# Patient Record
Sex: Male | Born: 1965
Health system: Southern US, Community
[De-identification: ages and names within clinical notes are randomized; demographics above are authoritative.]

## PROBLEM LIST (undated history)

## (undated) DIAGNOSIS — K219 Gastro-esophageal reflux disease without esophagitis: Secondary | ICD-10-CM

## (undated) DIAGNOSIS — N419 Inflammatory disease of prostate, unspecified: Secondary | ICD-10-CM

## (undated) DIAGNOSIS — R300 Dysuria: Secondary | ICD-10-CM

## (undated) DIAGNOSIS — M1990 Unspecified osteoarthritis, unspecified site: Secondary | ICD-10-CM

## (undated) DIAGNOSIS — IMO0001 Reserved for inherently not codable concepts without codable children: Secondary | ICD-10-CM

## (undated) DIAGNOSIS — U071 COVID-19: Secondary | ICD-10-CM

## (undated) DIAGNOSIS — K648 Other hemorrhoids: Secondary | ICD-10-CM

## (undated) DIAGNOSIS — I700 Atherosclerosis of aorta: Secondary | ICD-10-CM

## (undated) DIAGNOSIS — K5780 Diverticulitis of intestine, part unspecified, with perforation and abscess without bleeding: Secondary | ICD-10-CM

## (undated) DIAGNOSIS — I10 Essential (primary) hypertension: Secondary | ICD-10-CM

## (undated) DIAGNOSIS — R112 Nausea with vomiting, unspecified: Secondary | ICD-10-CM

## (undated) DIAGNOSIS — Z9889 Other specified postprocedural states: Secondary | ICD-10-CM

## (undated) DIAGNOSIS — G479 Sleep disorder, unspecified: Secondary | ICD-10-CM

## (undated) DIAGNOSIS — D126 Benign neoplasm of colon, unspecified: Secondary | ICD-10-CM

## (undated) DIAGNOSIS — R519 Headache, unspecified: Secondary | ICD-10-CM

## (undated) DIAGNOSIS — K859 Acute pancreatitis without necrosis or infection, unspecified: Secondary | ICD-10-CM

## (undated) DIAGNOSIS — Z8489 Family history of other specified conditions: Secondary | ICD-10-CM

## (undated) DIAGNOSIS — K5790 Diverticulosis of intestine, part unspecified, without perforation or abscess without bleeding: Secondary | ICD-10-CM

## (undated) DIAGNOSIS — N529 Male erectile dysfunction, unspecified: Secondary | ICD-10-CM

## (undated) DIAGNOSIS — E785 Hyperlipidemia, unspecified: Secondary | ICD-10-CM

## (undated) HISTORY — DX: Unspecified osteoarthritis, unspecified site: M19.90

## (undated) HISTORY — DX: Benign neoplasm of colon, unspecified: D12.6

## (undated) HISTORY — PX: KNEE SURGERY: SHX244

## (undated) HISTORY — PX: ABCESS DRAINAGE: SHX399

## (undated) HISTORY — PX: SHOULDER ARTHROSCOPY: SHX128

## (undated) HISTORY — DX: Essential (primary) hypertension: I10

## (undated) HISTORY — PX: HERNIA REPAIR: SHX51

## (undated) HISTORY — PX: SPHINCTEROTOMY: SHX5279

## (undated) HISTORY — DX: Reserved for inherently not codable concepts without codable children: IMO0001

## (undated) HISTORY — DX: Atherosclerosis of aorta: I70.0

## (undated) HISTORY — DX: Male erectile dysfunction, unspecified: N52.9

## (undated) HISTORY — PX: APPENDECTOMY: SHX54

## (undated) HISTORY — DX: Hyperlipidemia, unspecified: E78.5

## (undated) HISTORY — DX: Gastro-esophageal reflux disease without esophagitis: K21.9

## (undated) HISTORY — DX: Other hemorrhoids: K64.8

## (undated) HISTORY — PX: CHOLECYSTECTOMY: SHX55

## (undated) HISTORY — PX: TONSILLECTOMY: SUR1361

---

## 2001-01-28 ENCOUNTER — Emergency Department (HOSPITAL_COMMUNITY): Admission: EM | Admit: 2001-01-28 | Discharge: 2001-01-28 | Payer: Self-pay | Admitting: *Deleted

## 2001-02-15 ENCOUNTER — Other Ambulatory Visit: Admission: RE | Admit: 2001-02-15 | Discharge: 2001-02-15 | Payer: Self-pay | Admitting: Urology

## 2001-11-05 ENCOUNTER — Encounter: Payer: Self-pay | Admitting: Orthopaedic Surgery

## 2001-11-05 ENCOUNTER — Ambulatory Visit (HOSPITAL_COMMUNITY): Admission: RE | Admit: 2001-11-05 | Discharge: 2001-11-05 | Payer: Self-pay | Admitting: Orthopaedic Surgery

## 2003-06-26 ENCOUNTER — Encounter: Payer: Self-pay | Admitting: Family Medicine

## 2003-06-26 ENCOUNTER — Ambulatory Visit (HOSPITAL_COMMUNITY): Admission: RE | Admit: 2003-06-26 | Discharge: 2003-06-26 | Payer: Self-pay | Admitting: Family Medicine

## 2003-07-22 ENCOUNTER — Ambulatory Visit (HOSPITAL_COMMUNITY): Admission: RE | Admit: 2003-07-22 | Discharge: 2003-07-22 | Payer: Self-pay | Admitting: Internal Medicine

## 2003-07-28 ENCOUNTER — Ambulatory Visit (HOSPITAL_COMMUNITY): Admission: RE | Admit: 2003-07-28 | Discharge: 2003-07-28 | Payer: Self-pay | Admitting: Internal Medicine

## 2003-07-28 ENCOUNTER — Encounter: Payer: Self-pay | Admitting: Internal Medicine

## 2003-08-10 ENCOUNTER — Encounter: Payer: Self-pay | Admitting: Internal Medicine

## 2003-08-10 ENCOUNTER — Ambulatory Visit (HOSPITAL_COMMUNITY): Admission: RE | Admit: 2003-08-10 | Discharge: 2003-08-10 | Payer: Self-pay | Admitting: Internal Medicine

## 2004-06-27 ENCOUNTER — Ambulatory Visit (HOSPITAL_COMMUNITY): Admission: RE | Admit: 2004-06-27 | Discharge: 2004-06-27 | Payer: Self-pay | Admitting: Pediatrics

## 2004-07-12 ENCOUNTER — Ambulatory Visit (HOSPITAL_COMMUNITY): Admission: RE | Admit: 2004-07-12 | Discharge: 2004-07-12 | Payer: Self-pay | Admitting: Pediatrics

## 2004-08-01 ENCOUNTER — Ambulatory Visit (HOSPITAL_COMMUNITY): Admission: RE | Admit: 2004-08-01 | Discharge: 2004-08-01 | Payer: Self-pay | Admitting: Family Medicine

## 2004-08-04 ENCOUNTER — Ambulatory Visit (HOSPITAL_COMMUNITY): Admission: RE | Admit: 2004-08-04 | Discharge: 2004-08-04 | Payer: Self-pay | Admitting: Family Medicine

## 2004-10-11 ENCOUNTER — Ambulatory Visit: Payer: Self-pay | Admitting: Internal Medicine

## 2004-10-11 ENCOUNTER — Ambulatory Visit (HOSPITAL_BASED_OUTPATIENT_CLINIC_OR_DEPARTMENT_OTHER): Admission: RE | Admit: 2004-10-11 | Discharge: 2004-10-11 | Payer: Self-pay | Admitting: Rheumatology

## 2004-12-27 ENCOUNTER — Ambulatory Visit (HOSPITAL_COMMUNITY): Admission: RE | Admit: 2004-12-27 | Discharge: 2004-12-27 | Payer: Self-pay | Admitting: Pediatrics

## 2005-05-30 ENCOUNTER — Ambulatory Visit: Payer: Self-pay | Admitting: Internal Medicine

## 2005-06-02 ENCOUNTER — Ambulatory Visit (HOSPITAL_COMMUNITY): Admission: RE | Admit: 2005-06-02 | Discharge: 2005-06-02 | Payer: Self-pay | Admitting: Internal Medicine

## 2005-06-09 ENCOUNTER — Ambulatory Visit: Payer: Self-pay | Admitting: Internal Medicine

## 2005-06-30 ENCOUNTER — Ambulatory Visit (HOSPITAL_COMMUNITY): Admission: RE | Admit: 2005-06-30 | Discharge: 2005-06-30 | Payer: Self-pay | Admitting: Internal Medicine

## 2005-06-30 ENCOUNTER — Ambulatory Visit: Payer: Self-pay | Admitting: Internal Medicine

## 2005-09-21 ENCOUNTER — Ambulatory Visit (HOSPITAL_COMMUNITY): Admission: RE | Admit: 2005-09-21 | Discharge: 2005-09-21 | Payer: Self-pay | Admitting: Internal Medicine

## 2005-10-11 ENCOUNTER — Ambulatory Visit: Payer: Self-pay | Admitting: Internal Medicine

## 2006-08-16 ENCOUNTER — Ambulatory Visit: Payer: Self-pay | Admitting: Internal Medicine

## 2006-09-05 ENCOUNTER — Ambulatory Visit (HOSPITAL_COMMUNITY): Admission: RE | Admit: 2006-09-05 | Discharge: 2006-09-05 | Payer: Self-pay | Admitting: Internal Medicine

## 2007-01-07 ENCOUNTER — Ambulatory Visit (HOSPITAL_COMMUNITY): Admission: RE | Admit: 2007-01-07 | Discharge: 2007-01-07 | Payer: Self-pay | Admitting: Family Medicine

## 2008-09-03 ENCOUNTER — Ambulatory Visit (HOSPITAL_COMMUNITY): Admission: RE | Admit: 2008-09-03 | Discharge: 2008-09-03 | Payer: Self-pay | Admitting: Pediatrics

## 2009-03-10 ENCOUNTER — Ambulatory Visit (HOSPITAL_COMMUNITY): Admission: RE | Admit: 2009-03-10 | Discharge: 2009-03-10 | Payer: Self-pay | Admitting: Pediatrics

## 2009-09-21 ENCOUNTER — Ambulatory Visit (HOSPITAL_COMMUNITY): Admission: RE | Admit: 2009-09-21 | Discharge: 2009-09-21 | Payer: Self-pay | Admitting: Family Medicine

## 2011-03-03 NOTE — Procedures (Signed)
NAME:  William [name], William [name] NO.:  [id number]   MEDICAL RECORD NO.:  [id number]          PATIENT TYPE:  OUT   LOCATION:  SLEEP CENTER                 FACILITY:  [facility]   PHYSICIAN:  Clinton D. Maple Hudson, M.D. DATE OF BIRTH:  [birth year]   DATE OF STUDY:  10/11/2004                              NOCTURNAL POLYSOMNOGRAM   INDICATIONS FOR STUDY:  Insomnia with sleep apnea. Epworth Sleepiness Score  2/24, BMI 29.7, weight 225 pounds, neck size 16 inches.   SLEEP ARCHITECTURE:  Total sleep time 386 minutes with sleep efficiency of  87%. Stage I was 4%, stage II was 58%, stages III and IV were 14%, REM was  25% of total sleep time. Latency to sleep onset 32 minutes. Latency to REM  76 minutes. Awake after sleep onset 25 minutes. Arousal index 6.1. The  patient usually takes Ambien, but technician did not state whether he took  it for this study night.   RESPIRATORY DATA:  RDI 2.3 per hour which is within normal limits indicating  occasional obstructive respiratory events not meeting diagnostic criteria  for obstructive sleep apnea syndrome. There were 3 obstructive apneas and 12  hypopneas. The events were not positional. REM RDI 6.3 per hour. CPAP  titration was not attempted.   OXYGEN DATA:  Moderate snoring with oxygen desaturation to a nadir of 83% on  room air. Mean oxygen saturation through the study was 94% on room air.   CARDIAC DATA:  Normal sinus rhythm.   MOVEMENT/PARASOMNIA:  No significant movement causing any sleep disturbance.   IMPRESSION/RECOMMENDATIONS:  Occasional sleep disordered breathing events  not meeting scoring criteria for diagnosis of obstructive sleep  apnea/hypopnea syndrome.                                                           Clinton D. Maple Hudson, M.D.  Diplomate, American Board   CDY/MEDQ  D:  10/16/2004 12:22:02  T:  10/16/2004 14:09:35  Job:  [id number]

## 2011-03-03 NOTE — Op Note (Signed)
   NAME:  William [name], William [name]                        ACCOUNT NO.:  [id number]   MEDICAL RECORD NO.:  [id number]                   PATIENT TYPE:  AMB   LOCATION:  DAY                                  FACILITY:  [facility]   PHYSICIAN:  R. Roetta Sessions, M.D.              DATE OF BIRTH:  12-20-1965   DATE OF PROCEDURE:  07/22/2003  DATE OF DISCHARGE:                                 OPERATIVE REPORT   PROCEDURE:  Diagnostic esophagogastroduodenoscopy.   ENDOSCOPIST:  Gerrit Friends. Rourk, M.D.   INDICATIONS FOR PROCEDURE:  The patient is a 45 year old gentleman with  abdominal pain and excessive belching.  EGD is now being done to further  evaluate his symptoms.  This approach has been discussed with the patient  previously, and, again today, at the bedside. The potential risks, benefits,  and alternatives have been reviewed, questions answered. Please see my  dictated office H&P for more information.   PROCEDURE NOTE:  O2 saturation, blood pressure, pulse and respirations were  monitored throughout the entire procedure.  Conscious sedation: Versed 7 mg  IV, Demerol 150 mg IV in divided doses, Cetacaine spray for topical  oropharyngeal anesthesia.   INSTRUMENT:  Olympus video chip adult gastroscope.   FINDINGS:  Examination of the tubular esophagus revealed no mucosal  abnormalities.  The EG junction was easily traversed.   STOMACH:  The gastric cavity was empty.  It insufflated well with air.  A  thorough examination of the gastric mucosa including a retroflex view of the  proximal stomach and esophagogastric junction demonstrated no abnormalities.  The pylorus was patent and easily traversed.   DUODENUM:  The bulb and the second portion appeared normal.   THERAPEUTIC/DIAGNOSTIC MANEUVERS:  None.   The patient tolerated the procedure well and was reacted in endoscopy.   IMPRESSION:  Normal esophagus, stomach, D1 and D2.   RECOMMENDATIONS:  1. We have recently switched him from Prilosec  to Aciphex 20 mg orally     daily. He has not been on it long enough to tell any difference.  2. We will go ahead and proceed with a further gallbladder evaluation via     HIDA with CCK challenge at [facility].  3. Further recommendations to follow.   ADDENDUM:  Serum amylase and lipase, through my office, were recently found  to be normal at 70 and 28 respectively.      ___________________________________________                                            [name], M.D.   RMR/MEDQ  D:  07/22/2003  T:  07/22/2003  Job:  [id number]   cc:   Jeoffrey Massed, M.D.  [facility]  [place], [place] [address]  Fax: [phone]

## 2011-03-03 NOTE — Op Note (Signed)
NAME:  William [name], William [name]              ACCOUNT NO.:  [id number]   MEDICAL RECORD NO.:  [id number]          PATIENT TYPE:  AMB   LOCATION:  DAY                           FACILITY:  [facility]   PHYSICIAN:  R. Roetta Sessions, M.D. DATE OF BIRTH:  11-23-1965   DATE OF PROCEDURE:  06/30/2005  DATE OF DISCHARGE:                                 OPERATIVE REPORT   PROCEDURE:  Colonoscopy with ileoscopy, diagnostic.   INDICATIONS FOR PROCEDURE:  The patient is a 45 year old gentleman who has  returned three out of three Hemoccult cards which were all found to be  positive recently. He has had a two-year history of intermittent epigastric  right upper quadrant abdominal pain. This predates his cholecystectomy. He  states, if anything, symptoms have worsened since his cholecystectomy. He  also has a biopsy-proven mild macrovesicular steatosis without cirrhosis at  the time of cholecystectomy. He has had a mild transaminitis off and on over  a period of time. More recent, his LP has been completely normal. Ultrasound  of the right upper quadrant demonstrates normal appearing liver. Common bile  duct appeared normal caliber 4 mm. He does have an appointment to see Dr.  Noland [name] and associates, William [name]. William [name], to explore the  possibility of sphincter of Oddi dysfunction further. In the interim, he is  now to undergo a coloscopy to further evaluate hemoccult positive stool.  Clinically, he has not had any melena, hematochezia, or hematemesis. This  approach has been discussed with the patient at length. Potential risks,  benefits, and alternatives have been reviewed and questions answered. He is  agreeable. Please see documentation in the medical record.   PROCEDURE NOTE:  O2 saturation, blood pressure, pulse, and respirations were  monitored throughout the entire procedure. Conscious sedation with Versed 6  mg IV and Demerol 100 mg IV in divided doses.   INSTRUMENT:  Olympus video chip  system.   FINDINGS:  Digital rectal exam revealed no abnormalities.   ENDOSCOPIC FINDINGS:  Prep was good.   Rectum:  Examination of the rectal mucosa including retroflexed view of the  anal verge revealed minimal internal hemorrhoids. Otherwise normal rectum.   Colon:  Colonic mucosa was surveyed from the rectosigmoid junction through  the left, transverse, and right colon to the area of the appendiceal  orifice, ileocecal valve, and cecum. These structures were well seen and  photographed for the record. The terminal ileum was intubated to 10 cm. From  this level, the scope was slowly withdrawn, and all previously mentioned  mucosal surfaces were again seen. The patient had scattered left sided  diverticula. The remainder of the colonic mucosa appeared normal. The  terminal ileal mucosa appeared normal. The patient tolerated the procedure  well and was reactive to endoscopy.   IMPRESSION:  Minimal internal hemorrhoids. Otherwise normal rectum.  Scattered left sided diverticula. The remainder of colonic mucosa appeared  normal. Normal terminal ileum. Today's findings are reassuring.   RECOMMENDATIONS:  I have recommended William [name] continue on with his plans to  pursue second opinion and evaluation of his abdominal pain and explore the  possibility of sphincter of Oddi dysfunction further with William [name] and  associates at [name]. I will wait to  hear from William [name] and associates once the evaluation has been completed.      William [name], M.D.  Electronically Signed     RMR/MEDQ  D:  06/30/2005  T:  06/30/2005  Job:  [id number]   cc:   William [name]. William [name]  Fax: [phone]

## 2013-01-08 ENCOUNTER — Encounter: Payer: Self-pay | Admitting: *Deleted

## 2013-01-09 ENCOUNTER — Ambulatory Visit (INDEPENDENT_AMBULATORY_CARE_PROVIDER_SITE_OTHER): Payer: BC Managed Care – PPO | Admitting: Family Medicine

## 2013-01-09 ENCOUNTER — Encounter: Payer: Self-pay | Admitting: Family Medicine

## 2013-01-09 VITALS — BP 129/87 | HR 80 | Ht 73.0 in | Wt 204.0 lb

## 2013-01-09 DIAGNOSIS — E785 Hyperlipidemia, unspecified: Secondary | ICD-10-CM | POA: Insufficient documentation

## 2013-01-09 DIAGNOSIS — K219 Gastro-esophageal reflux disease without esophagitis: Secondary | ICD-10-CM

## 2013-01-09 DIAGNOSIS — N451 Epididymitis: Secondary | ICD-10-CM | POA: Insufficient documentation

## 2013-01-09 DIAGNOSIS — N453 Epididymo-orchitis: Secondary | ICD-10-CM

## 2013-01-09 MED ORDER — LOVASTATIN 40 MG PO TABS: 40.0000 mg | ORAL_TABLET | Freq: Every day | ORAL | Status: DC

## 2013-01-09 MED ORDER — OMEPRAZOLE 40 MG PO CPDR: 40.0000 mg | DELAYED_RELEASE_CAPSULE | Freq: Every day | ORAL | Status: DC

## 2013-01-09 MED ORDER — CIPROFLOXACIN HCL 500 MG PO TABS: 500.0000 mg | ORAL_TABLET | Freq: Two times a day (BID) | ORAL | Status: AC

## 2013-01-09 NOTE — Patient Instructions (Signed)
In a month or so call and schedule a time to come in for skin surgery

## 2013-01-09 NOTE — Progress Notes (Signed)
  Subjective:    Patient ID: William [name], male    DOB: [birth year], 47 y.o.   MRN: [id number]  HPI Recent gi symtpms. nasocort otc helping. Patient suffered a puncture wound to his left hand. Started on Keflex. Took it several days. Had a tetanus shot for this. Also notes a cyst at the edges I would. Bothering him somewhat. Wonders what to do. Claims compliance with lipid medicines. Trying to watch his diet. Notes. Testicular pain. Left side. Sharp in nature. No dysuria. No discharge. Positive history of epididymitis  Review of Systems    otherwise negative. Objective:   Physical Exam  Alert vital signs stable. HEENT ENT normal. Lungs clear. Heart regular in rhythm. Inclusion cyst left eyelid. Abdomen benign. Puncture wound left hand no evidence of cellulitis. Left posterior testicle. Tender inflamed.      Assessment & Plan:  Impression 1 acute epididymitis discussed. #2 inclusion cyst of I. #3 hyperlipidemia good control. #4 reflux stable. Plan Cipro twice a day 10 days. Other meds refilled. Set up skin surgery appointment.

## 2013-03-31 ENCOUNTER — Ambulatory Visit (INDEPENDENT_AMBULATORY_CARE_PROVIDER_SITE_OTHER): Payer: BC Managed Care – PPO | Admitting: Family Medicine

## 2013-03-31 ENCOUNTER — Encounter: Payer: Self-pay | Admitting: Family Medicine

## 2013-03-31 VITALS — BP 120/78 | Wt 207.6 lb

## 2013-03-31 DIAGNOSIS — H6091 Unspecified otitis externa, right ear: Secondary | ICD-10-CM

## 2013-03-31 DIAGNOSIS — H60399 Other infective otitis externa, unspecified ear: Secondary | ICD-10-CM

## 2013-03-31 DIAGNOSIS — R21 Rash and other nonspecific skin eruption: Secondary | ICD-10-CM

## 2013-03-31 MED ORDER — NEOMYCIN-POLYMYXIN-HC 3.5-10000-1 OT SOLN: 3.0000 [drp] | Freq: Four times a day (QID) | OTIC | Status: DC

## 2013-03-31 NOTE — Progress Notes (Signed)
  Subjective:    Patient ID: William Parents., male    DOB: 1966-01-14, 47 y.o.   MRN: [id number]  HPI Patient arrives office with right external ear canal discomfort. Pain at times. Itching intermittently. No obvious discharge. No headache no fever. Also has a cyst at the edge of his side that's concerning him. In addition has a small red bump on the right arm forearm has come up last several days.   Review of Systems No chest pain no cough no vomiting no diarrhea otherwise negative.    Objective:   Physical Exam  Alert no acute distress. Lungs clear. Heart regular in rhythm. HEENT right external canal inflamed. I prepped small inclusion cyst incised material withdrawn. Right forearm papule noted      Assessment & Plan:  Impression 1 external otitis. #2 cyst removal. We will hold off on surgical procedure charge because of easy removal #3 early skin lesion. Plan Cortisporin Otic suspension 3-4 drops 4 times a day affected ear. Doxy 100 mg twice a day for 10 days if arm persists. WSL

## 2013-04-01 ENCOUNTER — Telehealth: Payer: Self-pay | Admitting: Family Medicine

## 2013-04-01 MED ORDER — NEOMYCIN-POLYMYXIN-HC 3.5-10000-1 OT SOLN: 3.0000 [drp] | Freq: Four times a day (QID) | OTIC | Status: DC

## 2013-04-01 NOTE — Telephone Encounter (Signed)
Left message to return call 

## 2013-04-01 NOTE — Telephone Encounter (Signed)
Ear drops were electronically sent to [facility].

## 2013-04-01 NOTE — Telephone Encounter (Signed)
Patient notified

## 2013-04-01 NOTE — Telephone Encounter (Signed)
Actually ear drops, cortispor otic susp 3-4 qid to right ear. Did not rec eye drops for tiny cyst removal

## 2013-04-01 NOTE — Telephone Encounter (Signed)
Pt states he was supposed to have some eye drops escripted to [facility] yesterday at his appt with Dr Brett Canales. Can we resend this please

## 2013-04-10 ENCOUNTER — Encounter: Payer: Self-pay | Admitting: Family Medicine

## 2013-04-10 ENCOUNTER — Ambulatory Visit (INDEPENDENT_AMBULATORY_CARE_PROVIDER_SITE_OTHER): Payer: BC Managed Care – PPO | Admitting: Family Medicine

## 2013-04-10 VITALS — BP 132/86 | Wt 203.2 lb

## 2013-04-10 DIAGNOSIS — L0201 Cutaneous abscess of face: Secondary | ICD-10-CM

## 2013-04-10 NOTE — Progress Notes (Signed)
  Subjective:    Patient ID: William [name]., male    DOB: [birth year], 47 y.o.   MRN: [id number]  HPI Patient has long-standing cyst on his head. Uncomfortable at times. Sore at times.   Review of Systems    intermittent and tenderness. ROS otherwise negative. Objective:   Physical Exam  Scalp palpated on top of head.  Procedure note patient was prepped, draped, anesthetized with Xylocaine and epinephrine. Incision made with 11 blade. Some pus like material expressed. Cyst wall expressed.      Assessment & Plan:  Impression I and D. and cyst removal. Plan wound care discussed.

## 2013-04-30 ENCOUNTER — Ambulatory Visit (INDEPENDENT_AMBULATORY_CARE_PROVIDER_SITE_OTHER): Payer: BC Managed Care – PPO | Admitting: Family Medicine

## 2013-04-30 ENCOUNTER — Encounter: Payer: Self-pay | Admitting: Family Medicine

## 2013-04-30 VITALS — BP 130/78 | Temp 98.5°F | Wt 202.6 lb

## 2013-04-30 DIAGNOSIS — I889 Nonspecific lymphadenitis, unspecified: Secondary | ICD-10-CM

## 2013-04-30 MED ORDER — AMOXICILLIN-POT CLAVULANATE 875-125 MG PO TABS: 1.0000 | ORAL_TABLET | Freq: Two times a day (BID) | ORAL | Status: AC

## 2013-04-30 MED ORDER — OFLOXACIN 0.3 % OT SOLN: 5.0000 [drp] | Freq: Two times a day (BID) | OTIC | Status: AC

## 2013-04-30 NOTE — Progress Notes (Signed)
  Subjective:    Patient ID: William [name]., male    DOB: August 16, 1966, 47 y.o.   MRN: [id number]  Otalgia  There is pain in the right ear. The current episode started in the past 7 days. The problem occurs constantly. The problem has been gradually worsening. There has been no fever. The pain is at a severity of 3/10. The pain is mild. He has tried ear drops (decongest) for the symptoms.    no obvious fever.   Review of Systems  HENT: Positive for ear pain.    otherwise negative.     Objective:   Physical Exam  Alert no acute distress. Lungs clear. Heart regular in rhythm. HEENT extraocular canal swollen preauricular node tenderness      Assessment & Plan:  Impression external otitis with extension into preauricular region plan oral antibiotics and eardrops. Symptomatic care discussed. 15 minutes spent most in discussion WSL

## 2013-05-01 ENCOUNTER — Ambulatory Visit: Payer: BC Managed Care – PPO | Admitting: Family Medicine

## 2013-06-23 ENCOUNTER — Other Ambulatory Visit: Payer: Self-pay | Admitting: Family Medicine

## 2013-07-30 ENCOUNTER — Other Ambulatory Visit: Payer: Self-pay | Admitting: Family Medicine

## 2013-07-30 ENCOUNTER — Encounter: Payer: Self-pay | Admitting: Family Medicine

## 2013-07-30 ENCOUNTER — Ambulatory Visit (INDEPENDENT_AMBULATORY_CARE_PROVIDER_SITE_OTHER): Payer: BC Managed Care – PPO | Admitting: Family Medicine

## 2013-07-30 VITALS — BP 128/88 | Temp 98.8°F | Ht 73.0 in | Wt 213.4 lb

## 2013-07-30 DIAGNOSIS — J329 Chronic sinusitis, unspecified: Secondary | ICD-10-CM

## 2013-07-30 DIAGNOSIS — L259 Unspecified contact dermatitis, unspecified cause: Secondary | ICD-10-CM

## 2013-07-30 MED ORDER — PREDNISONE 20 MG PO TABS: ORAL_TABLET | ORAL | Status: DC

## 2013-07-30 MED ORDER — AMOXICILLIN-POT CLAVULANATE 875-125 MG PO TABS: 1.0000 | ORAL_TABLET | Freq: Two times a day (BID) | ORAL | Status: AC

## 2013-07-30 NOTE — Progress Notes (Signed)
  Subjective:    Patient ID: William Parents., male    DOB: [birth year], 47 y.o.   MRN: [id number]  Rash This is a new problem. The current episode started in the past 7 days. The problem has been gradually worsening since onset. The affected locations include the chest, torso, abdomen, left arm and right arm. The rash is characterized by itchiness, swelling and redness. Associated with: Son had shingles 3 weeks ago. Also worked in the yard Friday.  Associated symptoms include congestion, coughing and rhinorrhea. (Also has a cold with right ear pain) Past treatments include antihistamine and anti-itch cream. The treatment provided no relief.    Rash started  Cold last wk, developed cough and cold, still having sig cough,  Rash started  Friday,  Ear full and ached  Review of Systems  HENT: Positive for congestion and rhinorrhea.   Respiratory: Positive for cough.   Skin: Positive for rash.       Objective:   Physical Exam Alert. HEENT moderate congestion. Frontal tenderness. TMs somewhat retracted pharynx normal. Lungs clear heart regular rate and rhythm. Arms abdomen diffuse contact dermatitis with linear elements and blistering.       Assessment & Plan:  Impression rhinosinusitis with bronchitis #2 contact dermatitis plan prednisone taper. Augmentin twice a day 10 days. Symptomatic care discussed. WSL

## 2013-08-06 ENCOUNTER — Ambulatory Visit (INDEPENDENT_AMBULATORY_CARE_PROVIDER_SITE_OTHER): Payer: BC Managed Care – PPO | Admitting: *Deleted

## 2013-08-06 DIAGNOSIS — Z23 Encounter for immunization: Secondary | ICD-10-CM

## 2013-09-22 ENCOUNTER — Inpatient Hospital Stay (HOSPITAL_COMMUNITY)
Admission: EM | Admit: 2013-09-22 | Discharge: 2013-09-26 | DRG: 392 | Disposition: A | Discharge status: Home / Self Care | Payer: BC Managed Care – PPO | Attending: General Surgery | Admitting: General Surgery

## 2013-09-22 ENCOUNTER — Ambulatory Visit: Payer: BC Managed Care – PPO | Admitting: Family Medicine

## 2013-09-22 ENCOUNTER — Encounter (HOSPITAL_COMMUNITY): Payer: Self-pay | Admitting: Emergency Medicine

## 2013-09-22 ENCOUNTER — Other Ambulatory Visit: Payer: Self-pay | Admitting: *Deleted

## 2013-09-22 ENCOUNTER — Telehealth: Payer: Self-pay | Admitting: Family Medicine

## 2013-09-22 ENCOUNTER — Emergency Department (HOSPITAL_COMMUNITY): Payer: BC Managed Care – PPO

## 2013-09-22 DIAGNOSIS — K5720 Diverticulitis of large intestine with perforation and abscess without bleeding: Secondary | ICD-10-CM | POA: Diagnosis present

## 2013-09-22 DIAGNOSIS — E785 Hyperlipidemia, unspecified: Secondary | ICD-10-CM | POA: Diagnosis present

## 2013-09-22 DIAGNOSIS — K5732 Diverticulitis of large intestine without perforation or abscess without bleeding: Principal | ICD-10-CM | POA: Diagnosis present

## 2013-09-22 DIAGNOSIS — I10 Essential (primary) hypertension: Secondary | ICD-10-CM | POA: Diagnosis present

## 2013-09-22 DIAGNOSIS — K219 Gastro-esophageal reflux disease without esophagitis: Secondary | ICD-10-CM | POA: Diagnosis present

## 2013-09-22 HISTORY — DX: Acute pancreatitis without necrosis or infection, unspecified: K85.90

## 2013-09-22 HISTORY — DX: Diverticulosis of intestine, part unspecified, without perforation or abscess without bleeding: K57.90

## 2013-09-22 HISTORY — DX: Inflammatory disease of prostate, unspecified: N41.9

## 2013-09-22 LAB — CBC WITH DIFFERENTIAL/PLATELET
Basophils Absolute: 0 10*3/uL (ref 0.0–0.1)
Eosinophils Absolute: 0 10*3/uL (ref 0.0–0.7)
Eosinophils Relative: 0 % (ref 0–5)
HCT: 46.2 % (ref 39.0–52.0)
Lymphocytes Relative: 3 % — ABNORMAL LOW (ref 12–46)
MCH: 29.5 pg (ref 26.0–34.0)
MCV: 86.7 fL (ref 78.0–100.0)
Monocytes Absolute: 0.8 10*3/uL (ref 0.1–1.0)
Monocytes Relative: 5 % (ref 3–12)
Neutro Abs: 13.4 10*3/uL — ABNORMAL HIGH (ref 1.7–7.7)
Platelets: 202 10*3/uL (ref 150–400)
RDW: 13.1 % (ref 11.5–15.5)

## 2013-09-22 LAB — URINALYSIS, ROUTINE W REFLEX MICROSCOPIC
Bilirubin Urine: NEGATIVE
Hgb urine dipstick: NEGATIVE
Ketones, ur: NEGATIVE mg/dL
Nitrite: NEGATIVE
Protein, ur: NEGATIVE mg/dL
Specific Gravity, Urine: 1.015 (ref 1.005–1.030)
pH: 6 (ref 5.0–8.0)

## 2013-09-22 LAB — COMPREHENSIVE METABOLIC PANEL
ALT: 63 U/L — ABNORMAL HIGH (ref 0–53)
AST: 70 U/L — ABNORMAL HIGH (ref 0–37)
BUN: 21 mg/dL (ref 6–23)
CO2: 27 mEq/L (ref 19–32)
Calcium: 9.7 mg/dL (ref 8.4–10.5)
Chloride: 104 mEq/L (ref 96–112)
Creatinine, Ser: 0.83 mg/dL (ref 0.50–1.35)
GFR calc Af Amer: >90 mL/min (ref >90)
GFR calc non Af Amer: >90 mL/min (ref >90)
Glucose, Bld: 115 mg/dL — ABNORMAL HIGH (ref 70–99)
Potassium: 4.3 mEq/L (ref 3.5–5.1)
Sodium: 142 mEq/L (ref 135–145)
Total Bilirubin: 0.8 mg/dL (ref 0.3–1.2)
Total Protein: 7.7 g/dL (ref 6.0–8.3)

## 2013-09-22 MED ORDER — ACETAMINOPHEN 650 MG RE SUPP: 650.0000 mg | Freq: Four times a day (QID) | RECTAL | Status: DC | PRN

## 2013-09-22 MED ORDER — KCL IN DEXTROSE-NACL 20-5-0.45 MEQ/L-%-% IV SOLN
INTRAVENOUS | Status: DC
  Administered 2013-09-22 – 2013-09-25 (×5): via INTRAVENOUS

## 2013-09-22 MED ORDER — METRONIDAZOLE IN NACL 5-0.79 MG/ML-% IV SOLN
500.0000 mg | Freq: Once | INTRAVENOUS | Status: AC
  Administered 2013-09-22: 500 mg via INTRAVENOUS
  Filled 2013-09-22: qty 100

## 2013-09-22 MED ORDER — ENOXAPARIN SODIUM 40 MG/0.4ML ~~LOC~~ SOLN
40.0000 mg | SUBCUTANEOUS | Status: DC
  Administered 2013-09-22 – 2013-09-25 (×4): 40 mg via SUBCUTANEOUS
  Filled 2013-09-22 (×4): qty 0.4

## 2013-09-22 MED ORDER — CIPROFLOXACIN IN D5W 400 MG/200ML IV SOLN
INTRAVENOUS | Status: AC
  Filled 2013-09-22: qty 200

## 2013-09-22 MED ORDER — DIPHENHYDRAMINE HCL 50 MG/ML IJ SOLN
12.5000 mg | Freq: Four times a day (QID) | INTRAMUSCULAR | Status: DC | PRN
  Filled 2013-09-22: qty 1

## 2013-09-22 MED ORDER — CIPROFLOXACIN IN D5W 400 MG/200ML IV SOLN
400.0000 mg | Freq: Two times a day (BID) | INTRAVENOUS | Status: DC
  Administered 2013-09-23 – 2013-09-26 (×7): 400 mg via INTRAVENOUS
  Filled 2013-09-22 (×13): qty 200

## 2013-09-22 MED ORDER — IOHEXOL 300 MG/ML  SOLN
50.0000 mL | Freq: Once | INTRAMUSCULAR | Status: AC | PRN
  Administered 2013-09-22: 50 mL via ORAL

## 2013-09-22 MED ORDER — ONDANSETRON HCL 4 MG/2ML IJ SOLN
4.0000 mg | Freq: Four times a day (QID) | INTRAMUSCULAR | Status: DC | PRN
  Administered 2013-09-23 – 2013-09-26 (×5): 4 mg via INTRAVENOUS
  Filled 2013-09-22 (×5): qty 2

## 2013-09-22 MED ORDER — OXYCODONE HCL 5 MG PO TABS
5.0000 mg | ORAL_TABLET | ORAL | Status: DC | PRN
  Administered 2013-09-23 – 2013-09-25 (×8): 5 mg via ORAL
  Filled 2013-09-22 (×8): qty 1

## 2013-09-22 MED ORDER — ACETAMINOPHEN 325 MG PO TABS
650.0000 mg | ORAL_TABLET | Freq: Four times a day (QID) | ORAL | Status: DC | PRN
  Administered 2013-09-23 – 2013-09-26 (×5): 650 mg via ORAL
  Filled 2013-09-22 (×6): qty 2

## 2013-09-22 MED ORDER — FENTANYL CITRATE 0.05 MG/ML IJ SOLN
100.0000 ug | INTRAMUSCULAR | Status: DC | PRN
  Administered 2013-09-22 – 2013-09-25 (×21): 100 ug via INTRAVENOUS
  Filled 2013-09-22 (×23): qty 2

## 2013-09-22 MED ORDER — METRONIDAZOLE IN NACL 5-0.79 MG/ML-% IV SOLN
INTRAVENOUS | Status: AC
  Filled 2013-09-22: qty 200

## 2013-09-22 MED ORDER — FENTANYL CITRATE 0.05 MG/ML IJ SOLN
100.0000 ug | INTRAMUSCULAR | Status: AC | PRN
  Administered 2013-09-22 (×2): 100 ug via INTRAVENOUS
  Filled 2013-09-22 (×2): qty 2

## 2013-09-22 MED ORDER — ONDANSETRON HCL 4 MG/2ML IJ SOLN
4.0000 mg | INTRAMUSCULAR | Status: AC | PRN
  Administered 2013-09-22 (×2): 4 mg via INTRAVENOUS
  Filled 2013-09-22 (×2): qty 2

## 2013-09-22 MED ORDER — MORPHINE SULFATE 4 MG/ML IJ SOLN
4.0000 mg | INTRAMUSCULAR | Status: AC | PRN
  Administered 2013-09-22 (×2): 4 mg via INTRAVENOUS
  Filled 2013-09-22 (×2): qty 1

## 2013-09-22 MED ORDER — SODIUM CHLORIDE 0.9 % IJ SOLN
INTRAMUSCULAR | Status: AC
  Filled 2013-09-22: qty 500

## 2013-09-22 MED ORDER — CIPROFLOXACIN IN D5W 400 MG/200ML IV SOLN
400.0000 mg | Freq: Once | INTRAVENOUS | Status: AC
  Administered 2013-09-22: 400 mg via INTRAVENOUS
  Filled 2013-09-22: qty 200

## 2013-09-22 MED ORDER — IOHEXOL 300 MG/ML  SOLN
100.0000 mL | Freq: Once | INTRAMUSCULAR | Status: AC | PRN
  Administered 2013-09-22: 100 mL via INTRAVENOUS

## 2013-09-22 MED ORDER — ONDANSETRON HCL 4 MG/2ML IJ SOLN
4.0000 mg | INTRAMUSCULAR | Status: DC | PRN
  Administered 2013-09-22: 4 mg via INTRAVENOUS

## 2013-09-22 MED ORDER — PANTOPRAZOLE SODIUM 40 MG IV SOLR
40.0000 mg | Freq: Every day | INTRAVENOUS | Status: DC
  Administered 2013-09-22 – 2013-09-23 (×2): 40 mg via INTRAVENOUS
  Filled 2013-09-22 (×2): qty 40

## 2013-09-22 MED ORDER — ONDANSETRON HCL 4 MG/2ML IJ SOLN
INTRAMUSCULAR | Status: AC
  Administered 2013-09-22: 4 mg via INTRAVENOUS
  Filled 2013-09-22: qty 2

## 2013-09-22 MED ORDER — METRONIDAZOLE IN NACL 5-0.79 MG/ML-% IV SOLN: 500.0000 mg | Freq: Once | INTRAVENOUS | Status: DC

## 2013-09-22 MED ORDER — CIPROFLOXACIN HCL 750 MG PO TABS: 750.0000 mg | ORAL_TABLET | Freq: Two times a day (BID) | ORAL | Status: DC

## 2013-09-22 MED ORDER — DIPHENHYDRAMINE HCL 12.5 MG/5ML PO ELIX
12.5000 mg | ORAL_SOLUTION | Freq: Four times a day (QID) | ORAL | Status: DC | PRN
  Administered 2013-09-24: 12.5 mg via ORAL
  Filled 2013-09-22: qty 10

## 2013-09-22 MED ORDER — SODIUM CHLORIDE 0.9 % IV SOLN
INTRAVENOUS | Status: DC
  Administered 2013-09-22: 18:00:00 via INTRAVENOUS

## 2013-09-22 MED ORDER — METRONIDAZOLE IN NACL 5-0.79 MG/ML-% IV SOLN
500.0000 mg | Freq: Three times a day (TID) | INTRAVENOUS | Status: DC
  Administered 2013-09-23 – 2013-09-26 (×10): 500 mg via INTRAVENOUS
  Filled 2013-09-22 (×20): qty 100

## 2013-09-22 NOTE — Telephone Encounter (Signed)
Pt has appt tonight with Dr. Brett Canales

## 2013-09-22 NOTE — Telephone Encounter (Signed)
Patient says he woke up with acute prostatitis and would like something called in. He said the doctor would know that he gets these all the times.     William [name]

## 2013-09-22 NOTE — H&P (Signed)
William [name] is an 47 y.o. male.   Chief Complaint: Lower abdominal pain HPI: Patient is a 47 year old white male with history of diverticulosis and reflux disease who presented to the emergency room with worsening lower abdominal pain. He thought it was prostatitis. CT scan the abdomen and pelvis reveals sigmoid diverticulitis with a small amount of perforation due to free air. No abscess cavity noted. Patient states this is his first episode of diverticulitis. He had a colonoscopy done by Dr. Jena [name] approximately 4 years ago and was told he had diverticulosis. He denies any fever or chills. He states the episode started this morning around 9 AM. No nausea or vomiting have been noted.  Past Medical History  Diagnosis Date  . Hyperlipidemia   . Hypertension   . Reflux   . ED (erectile dysfunction)   . Prostatitis   . Pancreatitis, acute   . Diverticulosis     Past Surgical History  Procedure Laterality Date  . Knee surgery      Both  . Cholecystectomy    . Appendectomy    . Hernia repair    . Tonsillectomy    . Sphincterotomy      for sphincter of Oddi dysfunction    Family History  Problem Relation Age of Onset  . Cancer Father     Colon  . Cancer Other   . Diverticulitis Other    Social History:  reports that he has never smoked. He has never used smokeless tobacco. He reports that he drinks about 0.6 ounces of alcohol per week. He reports that he does not use illicit drugs.  Allergies: No Known Allergies   (Not in a hospital admission)  Results for orders placed during the hospital encounter of 09/22/13 (from the past 48 hour(s))  CBC WITH DIFFERENTIAL     Status: Abnormal   Collection Time    09/22/13  3:54 PM      Result Value Range   WBC 14.6 (*) 4.0 - 10.5 K/uL   RBC 5.33  4.22 - 5.81 MIL/uL   Hemoglobin 15.7  13.0 - 17.0 g/dL   HCT 09.8  11.9 - 14.7 %   MCV 86.7  78.0 - 100.0 fL   MCH 29.5  26.0 - 34.0 pg   MCHC 34.0  30.0 - 36.0 g/dL   RDW 82.9  56.2 -  13.0 %   Platelets 202  150 - 400 K/uL   Neutrophils Relative % 91 (*) 43 - 77 %   Neutro Abs 13.4 (*) 1.7 - 7.7 K/uL   Lymphocytes Relative 3 (*) 12 - 46 %   Lymphs Abs 0.5 (*) 0.7 - 4.0 K/uL   Monocytes Relative 5  3 - 12 %   Monocytes Absolute 0.8  0.1 - 1.0 K/uL   Eosinophils Relative 0  0 - 5 %   Eosinophils Absolute 0.0  0.0 - 0.7 K/uL   Basophils Relative 0  0 - 1 %   Basophils Absolute 0.0  0.0 - 0.1 K/uL  COMPREHENSIVE METABOLIC PANEL     Status: Abnormal   Collection Time    09/22/13  3:54 PM      Result Value Range   Sodium 142  135 - 145 mEq/L   Potassium 4.3  3.5 - 5.1 mEq/L   Chloride 104  96 - 112 mEq/L   CO2 27  19 - 32 mEq/L   Glucose, Bld 115 (*) 70 - 99 mg/dL   BUN 21  6 -  23 mg/dL   Creatinine, Ser 8.46  0.50 - 1.35 mg/dL   Calcium 9.7  8.4 - 96.2 mg/dL   Total Protein 7.7  6.0 - 8.3 g/dL   Albumin 4.6  3.5 - 5.2 g/dL   AST 70 (*) 0 - 37 U/L   ALT 63 (*) 0 - 53 U/L   Alkaline Phosphatase 77  39 - 117 U/L   Total Bilirubin 0.8  0.3 - 1.2 mg/dL   GFR calc non Af Amer >90  >90 mL/min   GFR calc Af Amer >90  >90 mL/min   Comment: (NOTE)     The eGFR has been calculated using the CKD EPI equation.     This calculation has not been validated in all clinical situations.     eGFR's persistently <90 mL/min signify possible Chronic Kidney     Disease.  LIPASE, BLOOD     Status: None   Collection Time    09/22/13  3:54 PM      Result Value Range   Lipase 26  11 - 59 U/L   Ct Abdomen Pelvis W Contrast  09/22/2013   CLINICAL DATA:  Lower pelvic pain and groin pain. History of hypertension, prostatitis. Renal stones. Pancreatitis. Diverticulosis, cholecystectomy, appendectomy and hernia repair.  EXAM: CT ABDOMEN AND PELVIS WITH CONTRAST  TECHNIQUE: Multidetector CT imaging of the abdomen and pelvis was performed using the standard protocol following bolus administration of intravenous contrast.  CONTRAST:  50mL OMNIPAQUE IOHEXOL 300 MG/ML SOLN, OMNIPAQUE  IOHEXOL 300 MG/ML SOLN  COMPARISON:  CT of the abdomen and pelvis on 03/04 6  FINDINGS: Images of the lung bases are unremarkable. No focal abnormality identified within the liver, spleen, pancreas adrenal glands, or kidneys. There is symmetric renal excretion bilaterally. The gallbladder is surgically absent.  There is thickening of the anterior wall of the stomach along the greater curvature, raising the question of gastritis. These small bowel loops have a normal appearance.  There are numerous colonic diverticula area within the sigmoid colon, there are numerous diverticula. There is stranding surrounding the sigmoid colon and fluid within the mesenteric. Small locules of gas are also identified within the mesenteric and a small amount of free intraperitoneal air is identified at the level of diaphragm, consistent with perforation. No abscess is identified.  No evidence for aortic aneurysm. No retroperitoneal or mesenteric adenopathy. The urinary bladder, prostate and seminal vesicles are normal in appearance.  There are degenerative changes primarily within the lower thoracic spine. No suspicious lytic or blastic lesions are identified.  IMPRESSION: 1. Findings consistent acute sigmoid diverticulitis associated with perforation. There is small amount of free pelvic fluid and free intraperitoneal air. 2. No evidence for abscess. 3. Thickened anterior gastric wall along the greater curvature of contour raising the question of acute gastritis. 4. Status post cholecystectomy, appendectomy. 5. Critical Value/emergent results were called by telephone at the time of interpretation on 09/22/2013 at 5:23 PM to Dr. Samuel [name] , who verbally acknowledged these results.   Electronically Signed   By: Rosalie Gums M.D.   On: 09/22/2013 17:25    Review of Systems  Constitutional: Positive for malaise/fatigue.  Respiratory: Negative.   Cardiovascular: Negative.   Gastrointestinal: Positive for heartburn and  abdominal pain.  Genitourinary: Positive for urgency.  Musculoskeletal: Negative.   Skin: Negative.   All other systems reviewed and are negative.    Blood pressure 125/81, pulse 91, temperature 98.8 F (37.1 C), temperature source Oral, resp. rate 17, height  6\' 1"  (1.854 m), weight 95.255 kg (210 lb), SpO2 96.00%. Physical Exam  Vitals reviewed. Constitutional: He is oriented to person, place, and time. He appears well-developed and well-nourished.  HENT:  Head: Normocephalic and atraumatic.  Neck: Normal range of motion. Neck supple.  Cardiovascular: Normal rate, regular rhythm and normal heart sounds.   Respiratory: Effort normal and breath sounds normal.  GI: Soft. He exhibits no distension. There is tenderness. There is guarding.  Tender in the suprapubic and left lower quadrant regions. No diffuse rigidity noted.  Neurological: He is alert and oriented to person, place, and time.  Skin: Skin is warm and dry.     Assessment/Plan Impression: Sigmoid diverticulitis with perforation, though no worsening diffuse peritonitis. Plan: Patient would like to avoid surgical intervention. I told him that we will try bowel rest, IV ciprofloxacin, IV Flagyl, and IV hydration. Should his condition worsen, he will require emergent colectomy with possible colostomy. This has been explained to the patient, who agrees to the treatment plan.  [place] Spainhower A 09/22/2013, 6:10 PM

## 2013-09-22 NOTE — ED Notes (Addendum)
Per patient went to bathroom this morning and had pain shooting from head of penis into rectum, pelvis, and into lower abd. Patient reports hx of prostasis. Patient reports pain is now coming intermittently and progressively getting worse. Per patient taking ibuprofen 800mg  with relief in pain in genitals. Per patient voided once with no pain but had x2 BMs with severe pain making him diaphoretic. Had appointment at Dr Cathlyn Parsons at 4 but could not wait. Reports tenderness in abd and bloated felling. Denies any vomiting but reports slight nausea.

## 2013-09-22 NOTE — ED Notes (Signed)
Dr. Jenkins at bedside. 

## 2013-09-22 NOTE — ED Notes (Signed)
States last time he ate or drank was 0800 today. Patient kept NPO except for contrast dye.

## 2013-09-22 NOTE — ED Provider Notes (Signed)
CSN: [id number]     Arrival date & time 09/22/13  1358 History   First MD Initiated Contact with Patient 09/22/13 1513     Chief Complaint  Patient presents with  . Groin Pain  . Pelvic Pain    HPI Pt was seen at 1520. Per pt, c/o gradual onset and worsening of persistent lower abd "pain" since this morning. Has been associated with dysuria and pelvic "pain." States he had a BM which made his pain worse, and urinated with improvement in his pain. Pt concerned his symptoms today are similar to a previous episode of prostatitis. Denies fevers, no rash, no vomiting/diarrhea, no hematuria, no flank pain, no testicular pain/swelling, no black or blood in stools.    Past Medical History  Diagnosis Date  . Hyperlipidemia   . Hypertension   . Reflux   . ED (erectile dysfunction)   . Prostatitis   . Pancreatitis, acute   . Diverticulosis    Past Surgical History  Procedure Laterality Date  . Knee surgery      Both  . Cholecystectomy    . Appendectomy    . Hernia repair    . Tonsillectomy    . Sphincterotomy      for sphincter of Oddi dysfunction   Family History  Problem Relation Age of Onset  . Cancer Father     Colon  . Cancer Other   . Diverticulitis Other    History  Substance Use Topics  . Smoking status: Never Smoker   . Smokeless tobacco: Never Used  . Alcohol Use: 0.6 oz/week    1 Glasses of wine per week    Review of Systems ROS: Statement: All systems negative except as marked or noted in the HPI; Constitutional: Negative for fever and chills. ; ; Eyes: Negative for eye pain, redness and discharge. ; ; ENMT: Negative for ear pain, hoarseness, nasal congestion, sinus pressure and sore throat. ; ; Cardiovascular: Negative for chest pain, palpitations, diaphoresis, dyspnea and peripheral edema. ; ; Respiratory: Negative for cough, wheezing and stridor. ; ; Gastrointestinal: +abd pain, nausea. Negative for vomiting, diarrhea, blood in stool, hematemesis, jaundice and  rectal bleeding. . ; ; Genitourinary: +dysuria. Negative for flank pain and hematuria. ; ; Genital:  No penile drainage or rash, no testicular pain or swelling, no scrotal rash or swelling.;;  Musculoskeletal: Negative for back pain and neck pain. Negative for swelling and trauma.; ; Skin: Negative for pruritus, rash, abrasions, blisters, bruising and skin lesion.; ; Neuro: Negative for headache, lightheadedness and neck stiffness. Negative for weakness, altered level of consciousness , altered mental status, extremity weakness, paresthesias, involuntary movement, seizure and syncope.       Allergies  Review of patient's allergies indicates no known allergies.  Home Medications   Current Outpatient Rx  Name  Route  Sig  Dispense  Refill  . ibuprofen (ADVIL,MOTRIN) 800 MG tablet   Oral   Take 800 mg by mouth every 8 (eight) hours as needed for moderate pain.         Marland Kitchen lovastatin (MEVACOR) 40 MG tablet   Oral   Take 1 tablet (40 mg total) by mouth at bedtime.   90 tablet   3   . omeprazole (PRILOSEC) 40 MG capsule   Oral   Take 1 capsule (40 mg total) by mouth daily.   90 capsule   3   . tadalafil (CIALIS) 20 MG tablet   Oral   Take 20 mg by mouth daily  as needed for erectile dysfunction.         . tadalafil (CIALIS) 20 MG tablet      daily prn          BP 115/102  Pulse 82  Temp(Src) 98.2 F (36.8 C) (Oral)  Resp 20  Ht 6\' 1"  (1.854 m)  Wt 210 lb (95.255 kg)  BMI 27.71 kg/m2  SpO2 100% Physical Exam 1525: Physical examination:  Nursing notes reviewed; Vital signs and O2 SAT reviewed;  Constitutional: Well developed, Well nourished, Well hydrated, Uncomfortable appearing.; Head:  Normocephalic, atraumatic; Eyes: EOMI, PERRL, No scleral icterus; ENMT: Mouth and pharynx normal, Mucous membranes moist; Neck: Supple, Full range of motion, No lymphadenopathy; Cardiovascular: Regular rate and rhythm, No murmur, rub, or gallop; Respiratory: Breath sounds clear & equal  bilaterally, No rales, rhonchi, wheezes.  Speaking full sentences with ease, Normal respiratory effort/excursion; Chest: Nontender, Movement normal; Abdomen: Soft, +RLQ, suprapubic, LLQ tender to palp. No rebound or guarding. Nondistended, Normal bowel sounds; Genitourinary: No CVA tenderness. Genital and rectal exam performed with pt permission and male ED RN chaperone present during exam.  Pt examined laying and standing.  No perineal erythema.  No penile lesions or drainage.  No scrotal erythema, edema or tenderness to palp.  Normal testicular lie.  No testicular tenderness to palp.  +cremasteric reflexes bilat.  No inguinal LAN or palpable masses.  Prostate tender on rectal exam. Soft brown stool in rectal vault. Anal tone normal. No palp masses, no fissures, no external hemorrhoids..;; Extremities: Pulses normal, No tenderness, No edema, No calf edema or asymmetry.; Neuro: AA&Ox3, Major CN grossly intact.  Speech clear. Climbs on and off stretcher easily by himself. Gait steady. No gross focal motor or sensory deficits in extremities.; Skin: Color normal, Warm, Dry.   ED Course  Procedures   EKG Interpretation   None       MDM  MDM Reviewed: previous chart, nursing note and vitals Reviewed previous: labs Interpretation: labs and CT scan Total time providing critical care: 30-74 minutes. This excludes time spent performing separately reportable procedures and services. Consults: general surgery   CRITICAL CARE Performed by: Laray Anger Total critical care time: 35 Critical care time was exclusive of separately billable procedures and treating other patients. Critical care was necessary to treat or prevent imminent or life-threatening deterioration. Critical care was time spent personally by me on the following activities: development of treatment plan with patient and/or surrogate as well as nursing, discussions with consultants, evaluation of patient's response to treatment,  examination of patient, obtaining history from patient or surrogate, ordering and performing treatments and interventions, ordering and review of laboratory studies, ordering and review of radiographic studies, pulse oximetry and re-evaluation of patient's condition.    Results for orders placed during the hospital encounter of 09/22/13  CBC WITH DIFFERENTIAL      Result Value Range   WBC 14.6 (*) 4.0 - 10.5 K/uL   RBC 5.33  4.22 - 5.81 MIL/uL   Hemoglobin 15.7  13.0 - 17.0 g/dL   HCT 16.1  09.6 - 04.5 %   MCV 86.7  78.0 - 100.0 fL   MCH 29.5  26.0 - 34.0 pg   MCHC 34.0  30.0 - 36.0 g/dL   RDW 40.9  81.1 - 91.4 %   Platelets 202  150 - 400 K/uL   Neutrophils Relative % 91 (*) 43 - 77 %   Neutro Abs 13.4 (*) 1.7 - 7.7 K/uL   Lymphocytes Relative 3 (*)  12 - 46 %   Lymphs Abs 0.5 (*) 0.7 - 4.0 K/uL   Monocytes Relative 5  3 - 12 %   Monocytes Absolute 0.8  0.1 - 1.0 K/uL   Eosinophils Relative 0  0 - 5 %   Eosinophils Absolute 0.0  0.0 - 0.7 K/uL   Basophils Relative 0  0 - 1 %   Basophils Absolute 0.0  0.0 - 0.1 K/uL  COMPREHENSIVE METABOLIC PANEL      Result Value Range   Sodium 142  135 - 145 mEq/L   Potassium 4.3  3.5 - 5.1 mEq/L   Chloride 104  96 - 112 mEq/L   CO2 27  19 - 32 mEq/L   Glucose, Bld 115 (*) 70 - 99 mg/dL   BUN 21  6 - 23 mg/dL   Creatinine, Ser 1.61  0.50 - 1.35 mg/dL   Calcium 9.7  8.4 - 09.6 mg/dL   Total Protein 7.7  6.0 - 8.3 g/dL   Albumin 4.6  3.5 - 5.2 g/dL   AST 70 (*) 0 - 37 U/L   ALT 63 (*) 0 - 53 U/L   Alkaline Phosphatase 77  39 - 117 U/L   Total Bilirubin 0.8  0.3 - 1.2 mg/dL   GFR calc non Af Amer >90  >90 mL/min   GFR calc Af Amer >90  >90 mL/min  LIPASE, BLOOD      Result Value Range   Lipase 26  11 - 59 U/L   Ct Abdomen Pelvis W Contrast 09/22/2013   CLINICAL DATA:  Lower pelvic pain and groin pain. History of hypertension, prostatitis. Renal stones. Pancreatitis. Diverticulosis, cholecystectomy, appendectomy and hernia repair.  EXAM:  CT ABDOMEN AND PELVIS WITH CONTRAST  TECHNIQUE: Multidetector CT imaging of the abdomen and pelvis was performed using the standard protocol following bolus administration of intravenous contrast.  CONTRAST:  50mL OMNIPAQUE IOHEXOL 300 MG/ML SOLN, OMNIPAQUE IOHEXOL 300 MG/ML SOLN  COMPARISON:  CT of the abdomen and pelvis on 03/04 6  FINDINGS: Images of the lung bases are unremarkable. No focal abnormality identified within the liver, spleen, pancreas adrenal glands, or kidneys. There is symmetric renal excretion bilaterally. The gallbladder is surgically absent.  There is thickening of the anterior wall of the stomach along the greater curvature, raising the question of gastritis. These small bowel loops have a normal appearance.  There are numerous colonic diverticula area within the sigmoid colon, there are numerous diverticula. There is stranding surrounding the sigmoid colon and fluid within the mesenteric. Small locules of gas are also identified within the mesenteric and a small amount of free intraperitoneal air is identified at the level of diaphragm, consistent with perforation. No abscess is identified.  No evidence for aortic aneurysm. No retroperitoneal or mesenteric adenopathy. The urinary bladder, prostate and seminal vesicles are normal in appearance.  There are degenerative changes primarily within the lower thoracic spine. No suspicious lytic or blastic lesions are identified.  IMPRESSION: 1. Findings consistent acute sigmoid diverticulitis associated with perforation. There is small amount of free pelvic fluid and free intraperitoneal air. 2. No evidence for abscess. 3. Thickened anterior gastric wall along the greater curvature of contour raising the question of acute gastritis. 4. Status post cholecystectomy, appendectomy. 5. Critical Value/emergent results were called by telephone at the time of interpretation on 09/22/2013 at 5:23 PM to Dr. Samuel Jester , who verbally acknowledged  these results.   Electronically Signed   By: Sandria Senter.D.  On: 09/22/2013 17:25    1740:  Diverticulitis with perforation; will start IV cipro and flagyl. Pt's last PO intake approx 0800 this morning. Dx and testing d/w pt and family.  Questions answered.  Verb understanding, agreeable to admit.  T/C to General Surgeon Dr. Lovell Sheehan, case discussed, including:  HPI, pertinent PM/SHx, VS/PE, dx testing, ED course and treatment:  Agreeable to admit, requests he will come to the ED for eval.    Laray Anger, DO 09/24/13 1710

## 2013-09-22 NOTE — ED Notes (Signed)
Report gven to [facility] RN unit 300. Ready to receive patient.

## 2013-09-22 NOTE — Telephone Encounter (Signed)
Let him know normally we request sieeing but will call in this timecipro 750 bid 21 d

## 2013-09-23 LAB — CBC
HCT: 40.3 % (ref 39.0–52.0)
Hemoglobin: 13.7 g/dL (ref 13.0–17.0)
MCHC: 34 g/dL (ref 30.0–36.0)
MCV: 86.3 fL (ref 78.0–100.0)
RDW: 13.3 % (ref 11.5–15.5)

## 2013-09-23 LAB — BASIC METABOLIC PANEL
BUN: 14 mg/dL (ref 6–23)
CO2: 28 mEq/L (ref 19–32)
Chloride: 100 mEq/L (ref 96–112)
GFR calc Af Amer: >90 mL/min (ref >90)
Glucose, Bld: 124 mg/dL — ABNORMAL HIGH (ref 70–99)
Potassium: 4.1 mEq/L (ref 3.5–5.1)

## 2013-09-23 LAB — MAGNESIUM: Magnesium: 1.4 mg/dL — ABNORMAL LOW (ref 1.5–2.5)

## 2013-09-23 NOTE — Progress Notes (Signed)
UR chart review completed.  

## 2013-09-23 NOTE — Progress Notes (Signed)
Subjective: Feels slightly better, pain confined to the suprapubic region. Pain medications do help control the pain.  Objective: Vital signs in last 24 hours: Temp:  [98.2 F (36.8 C)-101 F (38.3 C)] 99.4 F (37.4 C) (12/09 0935) Pulse Rate:  [82-118] 94 (12/09 0935) Resp:  [17-20] 18 (12/09 0935) BP: (106-158)/(66-102) 106/70 mmHg (12/09 0935) SpO2:  [92 %-100 %] 94 % (12/09 0935) Weight:  [95.255 kg (210 lb)-97.841 kg (215 lb 11.2 oz)] 97.841 kg (215 lb 11.2 oz) (12/08 1918) Last BM Date: 09/22/13  Intake/Output from previous day: 12/08 0701 - 12/09 0700 In: -  Out: 65 [Urine:65] Intake/Output this shift:    General appearance: alert and cooperative Resp: clear to auscultation bilaterally Cardio: regular rate and rhythm, S1, S2 normal, no murmur, click, rub or gallop GI: Soft with tenderness in the lower abdomen. No rigidity noted.  Lab Results:   Recent Labs  09/22/13 1554 09/23/13 0454  WBC 14.6* 15.4*  HGB 15.7 13.7  HCT 46.2 40.3  PLT 202 191   BMET  Recent Labs  09/22/13 1554 09/23/13 0454  NA 142 136  K 4.3 4.1  CL 104 100  CO2 27 28  GLUCOSE 115* 124*  BUN 21 14  CREATININE 0.83 0.89  CALCIUM 9.7 8.5   PT/INR No results found for this basename: LABPROT, INR,  in the last 72 hours  Studies/Results: Ct Abdomen Pelvis W Contrast  09/22/2013   CLINICAL DATA:  Lower pelvic pain and groin pain. History of hypertension, prostatitis. Renal stones. Pancreatitis. Diverticulosis, cholecystectomy, appendectomy and hernia repair.  EXAM: CT ABDOMEN AND PELVIS WITH CONTRAST  TECHNIQUE: Multidetector CT imaging of the abdomen and pelvis was performed using the standard protocol following bolus administration of intravenous contrast.  CONTRAST:  50mL OMNIPAQUE IOHEXOL 300 MG/ML SOLN, OMNIPAQUE IOHEXOL 300 MG/ML SOLN  COMPARISON:  CT of the abdomen and pelvis on 03/04 6  FINDINGS: Images of the lung bases are unremarkable. No focal abnormality identified  within the liver, spleen, pancreas adrenal glands, or kidneys. There is symmetric renal excretion bilaterally. The gallbladder is surgically absent.  There is thickening of the anterior wall of the stomach along the greater curvature, raising the question of gastritis. These small bowel loops have a normal appearance.  There are numerous colonic diverticula area within the sigmoid colon, there are numerous diverticula. There is stranding surrounding the sigmoid colon and fluid within the mesenteric. Small locules of gas are also identified within the mesenteric and a small amount of free intraperitoneal air is identified at the level of diaphragm, consistent with perforation. No abscess is identified.  No evidence for aortic aneurysm. No retroperitoneal or mesenteric adenopathy. The urinary bladder, prostate and seminal vesicles are normal in appearance.  There are degenerative changes primarily within the lower thoracic spine. No suspicious lytic or blastic lesions are identified.  IMPRESSION: 1. Findings consistent acute sigmoid diverticulitis associated with perforation. There is small amount of free pelvic fluid and free intraperitoneal air. 2. No evidence for abscess. 3. Thickened anterior gastric wall along the greater curvature of contour raising the question of acute gastritis. 4. Status post cholecystectomy, appendectomy. 5. Critical Value/emergent results were called by telephone at the time of interpretation on 09/22/2013 at 5:23 PM to Dr. Samuel Jester , who verbally acknowledged these results.   Electronically Signed   By: Rosalie Gums M.D.   On: 09/22/2013 17:25    Anti-infectives: Anti-infectives   Start     Dose/Rate Route Frequency Ordered Stop  09/23/13 0600  ciprofloxacin (CIPRO) IVPB 400 mg     400 mg 200 mL/hr over 60 Minutes Intravenous Every 12 hours 09/22/13 1931     09/23/13 0400  metroNIDAZOLE (FLAGYL) IVPB 500 mg     500 mg 100 mL/hr over 60 Minutes Intravenous Every 8 hours  09/22/13 1931     09/22/13 2000  metroNIDAZOLE (FLAGYL) IVPB 500 mg     500 mg 100 mL/hr over 60 Minutes Intravenous  Once 09/22/13 1920 09/22/13 2222   09/22/13 1745  ciprofloxacin (CIPRO) IVPB 400 mg     400 mg 200 mL/hr over 60 Minutes Intravenous  Once 09/22/13 1732 09/22/13 1845   09/22/13 1745  metroNIDAZOLE (FLAGYL) IVPB 500 mg  Status:  Discontinued     500 mg 100 mL/hr over 60 Minutes Intravenous  Once 09/22/13 1732 09/22/13 1920      Assessment/Plan: Impression: Sigmoid diverticulitis with perforation. Patient is slightly improved. He definitely has not worsened. No need for acute surgical intervention at this time. We'll continue current management.  LOS: 1 day    Hessie Varone A 09/23/2013

## 2013-09-23 NOTE — Care Management Note (Addendum)
    Page 1 of 1   09/26/2013     9:33:49 AM   CARE MANAGEMENT NOTE 09/26/2013  Patient:  William [name], William [name]   Account Number:  [id number]  Date Initiated:  09/23/2013  Documentation initiated by:  Sharrie Rothman  Subjective/Objective Assessment:   PT admitted from home with diverticulitis with perforation. Pt lives with his sons and will return home at discharge. Pt is independent with ADL's.     Action/Plan:   No CM Needs noted.   Anticipated DC Date:  09/26/2013   Anticipated DC Plan:  HOME/SELF CARE      DC Planning Services  CM consult      Choice offered to / List presented to:             Status of service:  Completed, signed off Medicare Important Message given?   (If response is "NO", the following Medicare IM given date fields will be blank) Date Medicare IM given:   Date Additional Medicare IM given:    Discharge Disposition:  HOME/SELF CARE  Per UR Regulation:    If discussed at Long Length of Stay Meetings, dates discussed:    Comments:  09/26/13 0930 Arlyss Queen, RN BSN CM Pt discharged home. No CM needs noted.  09/23/13 1135 Arlyss Queen, RN BSN CM

## 2013-09-24 LAB — CBC
HCT: 37.8 % — ABNORMAL LOW (ref 39.0–52.0)
Hemoglobin: 13.1 g/dL (ref 13.0–17.0)
MCHC: 34.7 g/dL (ref 30.0–36.0)
RBC: 4.39 MIL/uL (ref 4.22–5.81)
RDW: 13.5 % (ref 11.5–15.5)

## 2013-09-24 MED ORDER — PANTOPRAZOLE SODIUM 40 MG PO TBEC
40.0000 mg | DELAYED_RELEASE_TABLET | Freq: Every day | ORAL | Status: DC
  Administered 2013-09-24 – 2013-09-25 (×2): 40 mg via ORAL
  Filled 2013-09-24 (×2): qty 1

## 2013-09-24 NOTE — Progress Notes (Signed)
09/24/13 1533 Discussed switching to oral pain medication due to anticipated discharge soon per MD. Patient stated oxycodone not working well, abdominal pain rated 7/10 on 0-10 scale. requested fentanyl for pain as ordered. States will try oxycodone later this evening. Some abdominal discomfort and nausea after broth and ginger ale for lunch. Earnstine Regal, RN

## 2013-09-24 NOTE — Progress Notes (Signed)
Subjective: Feels much better. Cramping has improved. He is passing flatus.  Objective: Vital signs in last 24 hours: Temp:  [98 F (36.7 C)-99.4 F (37.4 C)] 99 F (37.2 C) (12/10 0553) Pulse Rate:  [94-107] 107 (12/10 0553) Resp:  [18-20] 20 (12/10 0553) BP: (104-115)/(69-82) 115/82 mmHg (12/10 0553) SpO2:  [94 %-97 %] 94 % (12/10 0553) Last BM Date: 09/22/13 (per patient)  Intake/Output from previous day:   Intake/Output this shift:    General appearance: alert, cooperative and no distress GI: Soft, decreased tenderness noted in the suprapubic left lower quadrant region. No rigidity noted. Active bowel sounds.  Lab Results:   Recent Labs  09/23/13 0454 09/24/13 0513  WBC 15.4* 13.3*  HGB 13.7 13.1  HCT 40.3 37.8*  PLT 191 173   BMET  Recent Labs  09/22/13 1554 09/23/13 0454  NA 142 136  K 4.3 4.1  CL 104 100  CO2 27 28  GLUCOSE 115* 124*  BUN 21 14  CREATININE 0.83 0.89  CALCIUM 9.7 8.5   PT/INR No results found for this basename: LABPROT, INR,  in the last 72 hours  Studies/Results: Ct Abdomen Pelvis W Contrast  09/22/2013   CLINICAL DATA:  Lower pelvic pain and groin pain. History of hypertension, prostatitis. Renal stones. Pancreatitis. Diverticulosis, cholecystectomy, appendectomy and hernia repair.  EXAM: CT ABDOMEN AND PELVIS WITH CONTRAST  TECHNIQUE: Multidetector CT imaging of the abdomen and pelvis was performed using the standard protocol following bolus administration of intravenous contrast.  CONTRAST:  50mL OMNIPAQUE IOHEXOL 300 MG/ML SOLN, OMNIPAQUE IOHEXOL 300 MG/ML SOLN  COMPARISON:  CT of the abdomen and pelvis on 03/04 6  FINDINGS: Images of the lung bases are unremarkable. No focal abnormality identified within the liver, spleen, pancreas adrenal glands, or kidneys. There is symmetric renal excretion bilaterally. The gallbladder is surgically absent.  There is thickening of the anterior wall of the stomach along the greater  curvature, raising the question of gastritis. These small bowel loops have a normal appearance.  There are numerous colonic diverticula area within the sigmoid colon, there are numerous diverticula. There is stranding surrounding the sigmoid colon and fluid within the mesenteric. Small locules of gas are also identified within the mesenteric and a small amount of free intraperitoneal air is identified at the level of diaphragm, consistent with perforation. No abscess is identified.  No evidence for aortic aneurysm. No retroperitoneal or mesenteric adenopathy. The urinary bladder, prostate and seminal vesicles are normal in appearance.  There are degenerative changes primarily within the lower thoracic spine. No suspicious lytic or blastic lesions are identified.  IMPRESSION: 1. Findings consistent acute sigmoid diverticulitis associated with perforation. There is small amount of free pelvic fluid and free intraperitoneal air. 2. No evidence for abscess. 3. Thickened anterior gastric wall along the greater curvature of contour raising the question of acute gastritis. 4. Status post cholecystectomy, appendectomy. 5. Critical Value/emergent results were called by telephone at the time of interpretation on 09/22/2013 at 5:23 PM to Dr. Samuel Jester , who verbally acknowledged these results.   Electronically Signed   By: Rosalie Gums M.D.   On: 09/22/2013 17:25    Anti-infectives: Anti-infectives   Start     Dose/Rate Route Frequency Ordered Stop   09/23/13 0600  ciprofloxacin (CIPRO) IVPB 400 mg     400 mg 200 mL/hr over 60 Minutes Intravenous Every 12 hours 09/22/13 1931     09/23/13 0400  metroNIDAZOLE (FLAGYL) IVPB 500 mg  500 mg 100 mL/hr over 60 Minutes Intravenous Every 8 hours 09/22/13 1931     09/22/13 2000  metroNIDAZOLE (FLAGYL) IVPB 500 mg     500 mg 100 mL/hr over 60 Minutes Intravenous  Once 09/22/13 1920 09/22/13 2222   09/22/13 1745  ciprofloxacin (CIPRO) IVPB 400 mg     400 mg 200  mL/hr over 60 Minutes Intravenous  Once 09/22/13 1732 09/22/13 1845   09/22/13 1745  metroNIDAZOLE (FLAGYL) IVPB 500 mg  Status:  Discontinued     500 mg 100 mL/hr over 60 Minutes Intravenous  Once 09/22/13 1732 09/22/13 1920      Assessment/Plan: Impression: Sigmoid diverticulitis, resolving. Leukocytosis resolving. Plan: We'll advance to full liquid diet. We'll decrease IV fluids. Anticipate discharge in next 24-48 hours.  LOS: 2 days    Nazaire Cordial A 09/24/2013

## 2013-09-25 MED ORDER — HYDROMORPHONE HCL 4 MG PO TABS
4.0000 mg | ORAL_TABLET | ORAL | Status: DC | PRN
  Administered 2013-09-25 (×3): 4 mg via ORAL
  Filled 2013-09-25 (×3): qty 1

## 2013-09-25 MED ORDER — SIMETHICONE 80 MG PO CHEW
80.0000 mg | CHEWABLE_TABLET | Freq: Four times a day (QID) | ORAL | Status: DC | PRN
  Administered 2013-09-25: 80 mg via ORAL
  Filled 2013-09-25: qty 1

## 2013-09-25 NOTE — Progress Notes (Signed)
  Subjective: Still with lower abdominal pain, trying to manage this with oral pain medications. He states he is definitely better than when he first came in the hospital. Tolerating full liquid diet well.  Objective: Vital signs in last 24 hours: Temp:  [98 F (36.7 C)-98.3 F (36.8 C)] 98.3 F (36.8 C) (12/11 0515) Pulse Rate:  [90-101] 90 (12/11 0515) Resp:  [20] 20 (12/11 0515) BP: (107-110)/(68-73) 110/73 mmHg (12/11 0515) SpO2:  [94 %-98 %] 98 % (12/11 0515) Last BM Date: 09/22/13 (per patient)  Intake/Output from previous day: 12/10 0701 - 12/11 0700 In: 1518.5 [P.O.:120; I.V.:998.5; IV Piggyback:400] Out: -  Intake/Output this shift:    General appearance: alert, cooperative and no distress Resp: clear to auscultation bilaterally Cardio: regular rate and rhythm, S1, S2 normal, no murmur, click, rub or gallop GI: Soft, mild tenderness in the suprapubic and left lower quadrant region. No rigidity noted. Active bowel sounds appreciated.  Lab Results:   Recent Labs  09/23/13 0454 09/24/13 0513  WBC 15.4* 13.3*  HGB 13.7 13.1  HCT 40.3 37.8*  PLT 191 173   BMET  Recent Labs  09/22/13 1554 09/23/13 0454  NA 142 136  K 4.3 4.1  CL 104 100  CO2 27 28  GLUCOSE 115* 124*  BUN 21 14  CREATININE 0.83 0.89  CALCIUM 9.7 8.5   PT/INR No results found for this basename: LABPROT, INR,  in the last 72 hours  Studies/Results: No results found.  Anti-infectives: Anti-infectives   Start     Dose/Rate Route Frequency Ordered Stop   09/23/13 0600  ciprofloxacin (CIPRO) IVPB 400 mg     400 mg 200 mL/hr over 60 Minutes Intravenous Every 12 hours 09/22/13 1931     09/23/13 0400  metroNIDAZOLE (FLAGYL) IVPB 500 mg     500 mg 100 mL/hr over 60 Minutes Intravenous Every 8 hours 09/22/13 1931     09/22/13 2000  metroNIDAZOLE (FLAGYL) IVPB 500 mg     500 mg 100 mL/hr over 60 Minutes Intravenous  Once 09/22/13 1920 09/22/13 2222   09/22/13 1745  ciprofloxacin (CIPRO)  IVPB 400 mg     400 mg 200 mL/hr over 60 Minutes Intravenous  Once 09/22/13 1732 09/22/13 1845   09/22/13 1745  metroNIDAZOLE (FLAGYL) IVPB 500 mg  Status:  Discontinued     500 mg 100 mL/hr over 60 Minutes Intravenous  Once 09/22/13 1732 09/22/13 1920      Assessment/Plan: Impression: Sigmoid diverticulitis, resolving. Plan: We'll advance to soft diet. We'll try dilaudid by mouth. Encourage ambulation.  LOS: 3 days    Jozlyn Schatz A 09/25/2013

## 2013-09-25 NOTE — Progress Notes (Signed)
09/25/13 1251 Patient c/o "gas pain, constipation". Text-paged Dr. Lovell Sheehan to notify. Pt ambulating in hallway to reduce gas discomfort as well. Earnstine Regal, RN

## 2013-09-25 NOTE — Progress Notes (Signed)
09/25/13 1402 Patient ambulating in hallway frequently this afternoon, states some decrease in "gas discomfort" after simethicone and ambulating. Earnstine Regal, RN

## 2013-09-25 NOTE — Progress Notes (Signed)
09/25/13 1602 Patient continues to ambulate hallway frequently this afternoon.  c/o abdominal pain, rated 10/10 on scale of 0-10 this afternoon, stated unable to wait until next dose of dilaudid by mouth due about 1600. Requested fentanyl as ordered IV for severe pain. Pt has been concerned with feeling constipated, discussed that pain medications tend to slow down GI system. Stated was aware, still wanted fentanyl for pain relief. Some nausea with activity, pain. zofran as ordered for nausea. On reassessment, pt asleep, appeared comfortable. Earnstine Regal, RN

## 2013-09-26 MED ORDER — OXYCODONE-ACETAMINOPHEN 7.5-325 MG PO TABS: 1.0000 | ORAL_TABLET | ORAL | Status: DC | PRN

## 2013-09-26 MED ORDER — ONDANSETRON HCL 4 MG PO TABS: 4.0000 mg | ORAL_TABLET | Freq: Three times a day (TID) | ORAL | Status: DC | PRN

## 2013-09-26 MED ORDER — METRONIDAZOLE 250 MG PO TABS: 250.0000 mg | ORAL_TABLET | Freq: Three times a day (TID) | ORAL | Status: DC

## 2013-09-26 MED ORDER — CIPROFLOXACIN HCL 500 MG PO TABS: 500.0000 mg | ORAL_TABLET | Freq: Two times a day (BID) | ORAL | Status: DC

## 2013-09-26 NOTE — Discharge Summary (Signed)
Physician Discharge Summary  Patient ID: William [name] MRN: [id number] DOB/AGE: [birth year] 47 y.o.  Admit date: 09/22/2013 Discharge date: 09/26/2013  Admission Diagnoses: Sigmoid diverticulitis  Discharge Diagnoses: Same Active Problems:   Diverticulitis of colon with perforation   Discharged Condition: good  Hospital Course: Patient is a 47 year old white male with known history of diverticulosis who presented emergency room with worsening lower abdominal pain. CT scan of the abdomen revealed sigmoid diverticulitis with localized perforation and no abscess. He was admitted to the hospital for IV antibiotics and bowel rest. He clinically improved over the next few days. His diet was advanced once bowel function returned. There was no need for acute surgical intervention at this time. He is being discharged home in good improving condition.  Discharge Exam: Blood pressure 112/76, pulse 92, temperature 98 F (36.7 C), temperature source Oral, resp. rate 18, height 6\' 1"  (1.854 m), weight 97.841 kg (215 lb 11.2 oz), SpO2 93.00%. General appearance: alert, cooperative and no distress Resp: clear to auscultation bilaterally Cardio: regular rate and rhythm, S1, S2 normal, no murmur, click, rub or gallop GI: Soft, minimal tenderness noted in the suprapubic and left lower quadrant regions. No rigidity noted. Active bowel sounds appreciated.  Disposition: Home    Medication List         ciprofloxacin 500 MG tablet  Commonly known as:  CIPRO  Take 1 tablet (500 mg total) by mouth 2 (two) times daily.     ibuprofen 800 MG tablet  Commonly known as:  ADVIL,MOTRIN  Take 800 mg by mouth every 8 (eight) hours as needed for moderate pain.     lovastatin 40 MG tablet  Commonly known as:  MEVACOR  Take 1 tablet (40 mg total) by mouth at bedtime.     metroNIDAZOLE 250 MG tablet  Commonly known as:  FLAGYL  Take 1 tablet (250 mg total) by mouth 3 (three) times daily.     omeprazole 40  MG capsule  Commonly known as:  PRILOSEC  Take 1 capsule (40 mg total) by mouth daily.     ondansetron 4 MG tablet  Commonly known as:  ZOFRAN  Take 1 tablet (4 mg total) by mouth every 8 (eight) hours as needed for nausea or vomiting.     oxyCODONE-acetaminophen 7.5-325 MG per tablet  Commonly known as:  PERCOCET  Take 1-2 tablets by mouth every 4 (four) hours as needed.     tadalafil 20 MG tablet  Commonly known as:  CIALIS  Take 20 mg by mouth daily as needed for erectile dysfunction.           Follow-up Information   Follow up with Dalia Heading, MD. Schedule an appointment as soon as possible for a visit on 10/02/2013.   Specialty:  General Surgery   Contact information:   [address] [place] [address] [phone]       Signed: Franky Macho A 09/26/2013, 9:03 AM

## 2013-09-26 NOTE — Discharge Instructions (Signed)
Diverticulitis  Small pockets or "bubbles" can develop in the wall of the intestine. Diverticulitis is when those pockets become infected and inflamed. This causes stomach pain (usually on the left side).  HOME CARE   Take all medicine as told by your doctor.   Try a clear liquid diet (broth, tea, or water) for as long as told by your doctor.   Keep all follow-up visits with your doctor.   You may be put on a low-fiber diet once you start feeling better. Here are foods that have low-fiber:   White breads, cereals, rice, and pasta.   Cooked fruits and vegetables or soft fresh fruits and vegetables without the skin.   Ground or well-cooked tender beef, ham, veal, lamb, pork, or poultry.   Eggs and seafood.   After you are doing well on the low-fiber diet, you may be put on a high-fiber diet. Here are ways to increase your fiber:   Choose whole-grain breads, cereals, pasta, and brown rice.   Choose fruits and vegetables with skin on. Do not overcook the vegetables.   Choose nuts, seeds, legumes, dried peas, beans, and lentils.   Look for food products that have more than 3 grams of fiber per serving on the food label.  GET HELP RIGHT AWAY IF:   Your pain does not get better or gets worse.   You have trouble eating food.   You are not pooping (having bowel movements) like normal.   You have a temperature by mouth above 102 F (38.9 C), not controlled by medicine.   You keep throwing up (vomiting).   You have bloody or black, tarry poop (stools).   You are getting worse and not better.  MAKE SURE YOU:    Understand these instructions.   Will watch your condition.   Will get help right away if you are not doing well or get worse.  Document Released: 03/20/2008 Document Revised: 12/25/2011 Document Reviewed: 08/23/2009  [employer] Patient Information 2014 [employer].

## 2013-09-26 NOTE — Progress Notes (Signed)
UR chart review completed.  

## 2013-09-26 NOTE — Progress Notes (Signed)
Patient  Received discharge instructions along with follow up appointments and prescriptions. Patient verbalized understanding of all instructions. Patient was escorted by staff via wheelchair to vehicle . Patient discharged to home in stable condition. 

## 2013-10-08 ENCOUNTER — Encounter: Payer: Self-pay | Admitting: Family Medicine

## 2013-10-08 ENCOUNTER — Encounter (HOSPITAL_COMMUNITY): Payer: Self-pay | Admitting: Emergency Medicine

## 2013-10-08 ENCOUNTER — Emergency Department (HOSPITAL_COMMUNITY): Payer: BC Managed Care – PPO

## 2013-10-08 ENCOUNTER — Inpatient Hospital Stay (HOSPITAL_COMMUNITY)
Admission: EM | Admit: 2013-10-08 | Discharge: 2013-10-10 | DRG: 392 | Disposition: A | Discharge status: Home / Self Care | Payer: BC Managed Care – PPO | Attending: General Surgery | Admitting: General Surgery

## 2013-10-08 ENCOUNTER — Ambulatory Visit (INDEPENDENT_AMBULATORY_CARE_PROVIDER_SITE_OTHER): Payer: BC Managed Care – PPO | Admitting: Family Medicine

## 2013-10-08 VITALS — Temp 98.5°F | Ht 73.0 in | Wt 199.6 lb

## 2013-10-08 DIAGNOSIS — K5720 Diverticulitis of large intestine with perforation and abscess without bleeding: Secondary | ICD-10-CM

## 2013-10-08 DIAGNOSIS — K5732 Diverticulitis of large intestine without perforation or abscess without bleeding: Principal | ICD-10-CM | POA: Diagnosis present

## 2013-10-08 DIAGNOSIS — K630 Abscess of intestine: Secondary | ICD-10-CM | POA: Diagnosis present

## 2013-10-08 DIAGNOSIS — K651 Peritoneal abscess: Secondary | ICD-10-CM

## 2013-10-08 DIAGNOSIS — E785 Hyperlipidemia, unspecified: Secondary | ICD-10-CM | POA: Diagnosis present

## 2013-10-08 DIAGNOSIS — I10 Essential (primary) hypertension: Secondary | ICD-10-CM | POA: Diagnosis present

## 2013-10-08 DIAGNOSIS — J111 Influenza due to unidentified influenza virus with other respiratory manifestations: Secondary | ICD-10-CM

## 2013-10-08 DIAGNOSIS — K5780 Diverticulitis of intestine, part unspecified, with perforation and abscess without bleeding: Secondary | ICD-10-CM | POA: Diagnosis present

## 2013-10-08 DIAGNOSIS — K219 Gastro-esophageal reflux disease without esophagitis: Secondary | ICD-10-CM | POA: Diagnosis present

## 2013-10-08 LAB — COMPREHENSIVE METABOLIC PANEL
ALT: 35 U/L (ref 0–53)
AST: 44 U/L — ABNORMAL HIGH (ref 0–37)
Albumin: 3.2 g/dL — ABNORMAL LOW (ref 3.5–5.2)
BUN: 11 mg/dL (ref 6–23)
Calcium: 9.1 mg/dL (ref 8.4–10.5)
Creatinine, Ser: 0.94 mg/dL (ref 0.50–1.35)
GFR calc non Af Amer: >90 mL/min (ref >90)
Sodium: 133 mEq/L — ABNORMAL LOW (ref 135–145)
Total Bilirubin: 0.4 mg/dL (ref 0.3–1.2)
Total Protein: 7.9 g/dL (ref 6.0–8.3)

## 2013-10-08 LAB — URINALYSIS W MICROSCOPIC + REFLEX CULTURE
Glucose, UA: NEGATIVE mg/dL
Leukocytes, UA: NEGATIVE
Nitrite: NEGATIVE
Protein, ur: 100 mg/dL — AB
Specific Gravity, Urine: >1.03 — ABNORMAL HIGH (ref 1.005–1.030)
pH: 6 (ref 5.0–8.0)

## 2013-10-08 LAB — CBC WITH DIFFERENTIAL/PLATELET
Basophils Absolute: 0 10*3/uL (ref 0.0–0.1)
Basophils Relative: 0 % (ref 0–1)
Eosinophils Absolute: 0 10*3/uL (ref 0.0–0.7)
Eosinophils Absolute: 0 10*3/uL (ref 0.0–0.7)
Eosinophils Relative: 0 % (ref 0–5)
Eosinophils Relative: 0 % (ref 0–5)
HCT: 39.4 % (ref 39.0–52.0)
HCT: 37.5 % — ABNORMAL LOW (ref 39.0–52.0)
Hemoglobin: 13.6 g/dL (ref 13.0–17.0)
Lymphocytes Relative: 8 % — ABNORMAL LOW (ref 12–46)
Lymphocytes Relative: 8 % — ABNORMAL LOW (ref 12–46)
Lymphs Abs: 1.1 10*3/uL (ref 0.7–4.0)
MCH: 29.6 pg (ref 26.0–34.0)
MCH: 29.1 pg (ref 26.0–34.0)
MCHC: 34.1 g/dL (ref 30.0–36.0)
MCV: 85.8 fL (ref 78.0–100.0)
MCV: 85.2 fL (ref 78.0–100.0)
Monocytes Absolute: 1.8 10*3/uL — ABNORMAL HIGH (ref 0.1–1.0)
Monocytes Absolute: 1.6 10*3/uL — ABNORMAL HIGH (ref 0.1–1.0)
Monocytes Relative: 13 % — ABNORMAL HIGH (ref 3–12)
Monocytes Relative: 14 % — ABNORMAL HIGH (ref 3–12)
Neutro Abs: 8.4 10*3/uL — ABNORMAL HIGH (ref 1.7–7.7)
Platelets: 601 10*3/uL — ABNORMAL HIGH (ref 150–400)
Platelets: 567 10*3/uL — ABNORMAL HIGH (ref 150–400)
RBC: 4.59 MIL/uL (ref 4.22–5.81)
RDW: 13.3 % (ref 11.5–15.5)
WBC: 13.8 10*3/uL — ABNORMAL HIGH (ref 4.0–10.5)
WBC: 10.9 10*3/uL — ABNORMAL HIGH (ref 4.0–10.5)

## 2013-10-08 LAB — INFLUENZA PANEL BY PCR (TYPE A & B)
Influenza A By PCR: POSITIVE — AB
Influenza B By PCR: NEGATIVE

## 2013-10-08 LAB — LACTIC ACID, PLASMA: Lactic Acid, Venous: 0.7 mmol/L (ref 0.5–2.2)

## 2013-10-08 LAB — PROTIME-INR: Prothrombin Time: 14.1 seconds (ref 11.6–15.2)

## 2013-10-08 MED ORDER — MORPHINE SULFATE 4 MG/ML IJ SOLN
4.0000 mg | INTRAMUSCULAR | Status: AC | PRN
  Administered 2013-10-08 (×2): 4 mg via INTRAVENOUS
  Filled 2013-10-08 (×2): qty 1

## 2013-10-08 MED ORDER — FENTANYL CITRATE 0.05 MG/ML IJ SOLN
100.0000 ug | INTRAMUSCULAR | Status: DC | PRN
  Administered 2013-10-08 – 2013-10-09 (×5): 100 ug via INTRAVENOUS
  Filled 2013-10-08 (×6): qty 2

## 2013-10-08 MED ORDER — SODIUM CHLORIDE 0.9 % IV SOLN
INTRAVENOUS | Status: DC
  Administered 2013-10-08 – 2013-10-09 (×3): via INTRAVENOUS

## 2013-10-08 MED ORDER — OSELTAMIVIR PHOSPHATE 75 MG PO CAPS
75.0000 mg | ORAL_CAPSULE | Freq: Two times a day (BID) | ORAL | Status: DC
  Administered 2013-10-08 – 2013-10-10 (×4): 75 mg via ORAL
  Filled 2013-10-08 (×4): qty 1

## 2013-10-08 MED ORDER — OSELTAMIVIR PHOSPHATE 75 MG PO CAPS: 75.0000 mg | ORAL_CAPSULE | Freq: Two times a day (BID) | ORAL | Status: DC

## 2013-10-08 MED ORDER — PIPERACILLIN-TAZOBACTAM 3.375 G IVPB
3.3750 g | Freq: Three times a day (TID) | INTRAVENOUS | Status: DC
  Administered 2013-10-08 – 2013-10-10 (×6): 3.375 g via INTRAVENOUS
  Filled 2013-10-08 (×15): qty 50

## 2013-10-08 MED ORDER — IOHEXOL 300 MG/ML  SOLN
50.0000 mL | Freq: Once | INTRAMUSCULAR | Status: AC | PRN
  Administered 2013-10-08: 50 mL via ORAL

## 2013-10-08 MED ORDER — PANTOPRAZOLE SODIUM 40 MG PO TBEC
40.0000 mg | DELAYED_RELEASE_TABLET | Freq: Every day | ORAL | Status: DC
  Administered 2013-10-08 – 2013-10-10 (×3): 40 mg via ORAL
  Filled 2013-10-08 (×3): qty 1

## 2013-10-08 MED ORDER — SODIUM CHLORIDE 0.9 % IV SOLN: INTRAVENOUS | Status: AC

## 2013-10-08 MED ORDER — OXYCODONE-ACETAMINOPHEN 5-325 MG PO TABS
1.0000 | ORAL_TABLET | ORAL | Status: DC | PRN
  Administered 2013-10-09 (×2): 2 via ORAL
  Administered 2013-10-09: 1 via ORAL
  Administered 2013-10-09 (×2): 2 via ORAL
  Administered 2013-10-09 – 2013-10-10 (×2): 1 via ORAL
  Administered 2013-10-10 (×2): 2 via ORAL
  Administered 2013-10-10: 1 via ORAL
  Filled 2013-10-08 (×2): qty 2
  Filled 2013-10-08: qty 1
  Filled 2013-10-08: qty 2
  Filled 2013-10-08 (×2): qty 1
  Filled 2013-10-08 (×2): qty 2
  Filled 2013-10-08: qty 1
  Filled 2013-10-08: qty 2

## 2013-10-08 MED ORDER — IOHEXOL 300 MG/ML  SOLN
100.0000 mL | Freq: Once | INTRAMUSCULAR | Status: AC | PRN
  Administered 2013-10-08: 100 mL via INTRAVENOUS

## 2013-10-08 MED ORDER — ONDANSETRON HCL 4 MG/2ML IJ SOLN
4.0000 mg | Freq: Three times a day (TID) | INTRAMUSCULAR | Status: DC | PRN
  Administered 2013-10-08: 4 mg via INTRAVENOUS

## 2013-10-08 MED ORDER — OXYCODONE-ACETAMINOPHEN 7.5-325 MG PO TABS: 1.0000 | ORAL_TABLET | ORAL | Status: DC | PRN

## 2013-10-08 MED ORDER — ONDANSETRON HCL 4 MG/2ML IJ SOLN
4.0000 mg | INTRAMUSCULAR | Status: DC | PRN
  Administered 2013-10-08: 4 mg via INTRAVENOUS
  Filled 2013-10-08 (×2): qty 2

## 2013-10-08 MED ORDER — PIPERACILLIN-TAZOBACTAM 3.375 G IVPB
INTRAVENOUS | Status: AC
  Filled 2013-10-08: qty 50

## 2013-10-08 MED ORDER — DIPHENHYDRAMINE HCL 50 MG/ML IJ SOLN: 12.5000 mg | Freq: Four times a day (QID) | INTRAMUSCULAR | Status: DC | PRN

## 2013-10-08 MED ORDER — ENOXAPARIN SODIUM 40 MG/0.4ML ~~LOC~~ SOLN
40.0000 mg | SUBCUTANEOUS | Status: DC
  Administered 2013-10-08 – 2013-10-09 (×2): 40 mg via SUBCUTANEOUS
  Filled 2013-10-08 (×2): qty 0.4

## 2013-10-08 MED ORDER — DIPHENHYDRAMINE HCL 12.5 MG/5ML PO ELIX: 12.5000 mg | ORAL_SOLUTION | Freq: Four times a day (QID) | ORAL | Status: DC | PRN

## 2013-10-08 MED ORDER — SODIUM CHLORIDE 0.9 % IJ SOLN
INTRAMUSCULAR | Status: AC
  Filled 2013-10-08: qty 350

## 2013-10-08 MED ORDER — HYDROMORPHONE HCL PF 1 MG/ML IJ SOLN
0.5000 mg | INTRAMUSCULAR | Status: DC | PRN
  Administered 2013-10-08: 0.5 mg via INTRAVENOUS
  Filled 2013-10-08: qty 1

## 2013-10-08 NOTE — Progress Notes (Signed)
   Subjective:    Patient ID: William [name], male    DOB: 09-Jul-1966, 47 y.o.   MRN: [id number]  Cough This is a new problem. The current episode started in the past 7 days. Associated symptoms include a fever, headaches, myalgias, nasal congestion and rhinorrhea. Pertinent negatives include no chest pain, ear pain or wheezing.   Patient arrives with cough, runny nose, headache and body aches for just a few days. This patient has run some 102/103 fever with chills and sweats over the past few days. In addition to this he's had increasing abdominal pain and discomfort in the midabdomen region upper abdomen and also having abdominal pain in the left lower quadrant.  This patient was in the hospital for several days under Dr. Franky Macho care for a perforated sigmoid diverticulitis. Was on antibiotics for several days but has stopped the antibiotics because of complete the course. Apparently will be getting followup with Dr. Lovell Sheehan again in several weeks PMH benign otherwise  Review of Systems  Constitutional: Positive for fever. Negative for activity change.  HENT: Positive for congestion and rhinorrhea. Negative for ear pain.   Eyes: Negative for discharge.  Respiratory: Positive for cough. Negative for wheezing.   Cardiovascular: Negative for chest pain.  Musculoskeletal: Positive for myalgias.  Neurological: Positive for headaches.       Objective:   Physical Exam  Nursing note and vitals reviewed. Constitutional: He appears well-developed.  HENT:  Head: Normocephalic.  Mouth/Throat: Oropharynx is clear and moist. No oropharyngeal exudate.  Neck: Normal range of motion.  Cardiovascular: Normal rate, regular rhythm and normal heart sounds.   No murmur heard. Pulmonary/Chest: Effort normal and breath sounds normal. He has no wheezes.  Abdominal: He exhibits no distension. There is tenderness (more in LLQ). There is no rebound and no guarding.  Lymphadenopathy:    He has no  cervical adenopathy.  Neurological: He exhibits normal muscle tone.  Skin: Skin is warm and dry.          Assessment & Plan:  #1 viral syndrome probable flu-on the outer edge of benefit from Tamiflu. Prescription sent in for the Tamiflu twice a day for the next 5 days  #2 abdominal pain-a stat CBC was completed through outpatient lab at the hospital. White count was 13.8 with elevated platelet count and no significant left shift. This is concerning for the possibility of worsening of his abdominal symptoms. Given his recent hospitalization plus increased pain and discomfort that is going on currently I am concerned regarding the status of his diverticulitis in the possibility of abscess involvement. I would recommend that this patient be seen in the emergency department possibly have to get a CT scan. The ER doctor as well as [facility] nurse was spoken with. The patient was spoken with and agreed to go to the ER.

## 2013-10-08 NOTE — ED Notes (Signed)
Pt sent by Dr. Gerda Diss for eval of worsening abd pain.  Reports recently had perforated diverticulum.  Denies n/v.  Reports has had intermittent diarrhea.

## 2013-10-08 NOTE — ED Provider Notes (Signed)
CSN: [id number]     Arrival date & time 10/08/13  1437 History   First MD Initiated Contact with Patient 10/08/13 1440     Chief Complaint  Patient presents with  . Abdominal Pain    HPI Pt was seen at 1620.  Per pt and his spouse, c/o gradual onset and persistence of constant runny/stuffy nose, sinus congestion, cough, home fevers to "102/103," chills, generalized body aches and fatigue for the past 2 to 3 days. Describes his symptoms as "I feel like I have the flu." Pt states he also has had several episodes of diarrhea and gradually worsening abd "pain" since he was discharged from the hospital for diverticulitis on 09/26/13. Describes the abd pain as "aching." States he was evaluated by his PMD PTA, told his "WBC count was high on my CBC" and was sent to the ED for further evaluation. Pt also with dx with "the flu," and rx tamiflu. Denies CP/palpitations, no SOB, no back pain, no N/V, no rash, no black or blood in stools.      Past Medical History  Diagnosis Date  . Hyperlipidemia   . Hypertension   . Reflux   . ED (erectile dysfunction)   . Prostatitis   . Pancreatitis, acute   . Diverticulosis    Past Surgical History  Procedure Laterality Date  . Knee surgery      Both  . Cholecystectomy    . Appendectomy    . Hernia repair    . Tonsillectomy    . Sphincterotomy      for sphincter of Oddi dysfunction   Family History  Problem Relation Age of Onset  . Cancer Father     Colon  . Cancer Other   . Diverticulitis Other    History  Substance Use Topics  . Smoking status: Never Smoker   . Smokeless tobacco: Never Used  . Alcohol Use: 0.6 oz/week    1 Glasses of wine per week     Comment: occ    Review of Systems ROS: Statement: All systems negative except as marked or noted in the HPI; Constitutional: +fever, chills, generalized body aches/fatigue. ; ; Eyes: Negative for eye pain, redness and discharge. ; ; ENMT: Negative for ear pain, hoarseness, sore throat. +nasal  congestion, sinus pressure. ; ; Cardiovascular: Negative for chest pain, palpitations, diaphoresis, dyspnea and peripheral edema. ; ; Respiratory: +cough. Negative for wheezing and stridor. ; ; Gastrointestinal: +abd pain, diarrhea. Negative for nausea, vomiting, blood in stool, hematemesis, jaundice and rectal bleeding. . ; ; Genitourinary: Negative for dysuria, flank pain and hematuria. ; ; Musculoskeletal: Negative for back pain and neck pain. Negative for swelling and trauma.; ; Skin: Negative for pruritus, rash, abrasions, blisters, bruising and skin lesion.; ; Neuro: Negative for headache, lightheadedness and neck stiffness. Negative for weakness, altered level of consciousness , altered mental status, extremity weakness, paresthesias, involuntary movement, seizure and syncope.      Allergies  Review of patient's allergies indicates no known allergies.  Home Medications   Current Outpatient Rx  Name  Route  Sig  Dispense  Refill  . ibuprofen (ADVIL,MOTRIN) 800 MG tablet   Oral   Take 800 mg by mouth every 8 (eight) hours as needed for moderate pain.         Marland Kitchen lovastatin (MEVACOR) 40 MG tablet   Oral   Take 1 tablet (40 mg total) by mouth at bedtime.   90 tablet   3   . omeprazole (PRILOSEC) 40  MG capsule   Oral   Take 1 capsule (40 mg total) by mouth daily.   90 capsule   3   . ondansetron (ZOFRAN) 4 MG tablet   Oral   Take 1 tablet (4 mg total) by mouth every 8 (eight) hours as needed for nausea or vomiting.   20 tablet   0   . oseltamivir (TAMIFLU) 75 MG capsule   Oral   Take 1 capsule (75 mg total) by mouth 2 (two) times daily.   10 capsule   0   . oxyCODONE-acetaminophen (PERCOCET) 7.5-325 MG per tablet   Oral   Take 1-2 tablets by mouth every 4 (four) hours as needed.   50 tablet   0   . ciprofloxacin (CIPRO) 500 MG tablet   Oral   Take 1 tablet (500 mg total) by mouth 2 (two) times daily.   20 tablet   0   . tadalafil (CIALIS) 20 MG tablet   Oral    Take 20 mg by mouth daily as needed for erectile dysfunction.          BP 109/67  Pulse 100  Temp(Src) 98.9 F (37.2 C) (Oral)  Resp 20  Ht 6\' 1"  (1.854 m)  Wt 195 lb (88.451 kg)  BMI 25.73 kg/m2  SpO2 94% Filed Vitals:   10/08/13 1440 10/08/13 1746  BP: 121/74 109/67  Pulse: 97 100  Temp: 98.9 F (37.2 C)   TempSrc: Oral   Resp: 20   Height: 6\' 1"  (1.854 m)   Weight: 195 lb (88.451 kg)   SpO2: 98% 94%    Physical Exam 1625: Physical examination:  Nursing notes reviewed; Vital signs and O2 SAT reviewed;  Constitutional: Well developed, Well nourished, Well hydrated, In no acute distress; Head:  Normocephalic, atraumatic; Eyes: EOMI, PERRL, No scleral icterus; ENMT: TM's clear bilat. +edemetous nasal turbinates bilat with clear rhinorrhea. Mouth and pharynx without lesions. No tonsillar exudates. No intra-oral edema. No submandibular or sublingual edema. No hoarse voice, no drooling, no stridor. No pain with manipulation of larynx. Mouth and pharynx normal, Mucous membranes moist; Neck: Supple, Full range of motion, No lymphadenopathy; Cardiovascular: Regular rate and rhythm, No gallop; Respiratory: Breath sounds clear & equal bilaterally, No wheezes.  Speaking full sentences with ease, Normal respiratory effort/excursion; Chest: Nontender, Movement normal; Abdomen: Soft, +LLQ tender to palp. No rebound or guarding. Nondistended, Normal bowel sounds; Genitourinary: No CVA tenderness; Extremities: Pulses normal, No tenderness, No edema, No calf edema or asymmetry.; Neuro: AA&Ox3, Major CN grossly intact.  Speech clear. Climbs on and off stretcher easily by himself. Gait steady. No gross focal motor or sensory deficits in extremities.; Skin: Color normal, Warm, Dry.   ED Course  Procedures     EKG Interpretation   None       MDM  MDM Reviewed: previous chart, nursing note and vitals Reviewed previous: labs and CT scan Interpretation: labs, x-ray and CT scan Total time  providing critical care: 30-74 minutes. This excludes time spent performing separately reportable procedures and services. Consults: general surgery   CRITICAL CARE Performed by: Laray Anger Total critical care time: 35 Critical care time was exclusive of separately billable procedures and treating other patients. Critical care was necessary to treat or prevent imminent or life-threatening deterioration. Critical care was time spent personally by me on the following activities: development of treatment plan with patient and/or surrogate as well as nursing, discussions with consultants, evaluation of patient's response to treatment, examination of patient, obtaining  history from patient or surrogate, ordering and performing treatments and interventions, ordering and review of laboratory studies, ordering and review of radiographic studies, pulse oximetry and re-evaluation of patient's condition.    Results for orders placed during the hospital encounter of 10/08/13  CBC WITH DIFFERENTIAL      Result Value Range   WBC 10.9 (*) 4.0 - 10.5 K/uL   RBC 4.40  4.22 - 5.81 MIL/uL   Hemoglobin 12.8 (*) 13.0 - 17.0 g/dL   HCT 16.1 (*) 09.6 - 04.5 %   MCV 85.2  78.0 - 100.0 fL   MCH 29.1  26.0 - 34.0 pg   MCHC 34.1  30.0 - 36.0 g/dL   RDW 40.9  81.1 - 91.4 %   Platelets 567 (*) 150 - 400 K/uL   Neutrophils Relative % 77  43 - 77 %   Neutro Abs 8.4 (*) 1.7 - 7.7 K/uL   Lymphocytes Relative 8 (*) 12 - 46 %   Lymphs Abs 0.9  0.7 - 4.0 K/uL   Monocytes Relative 14 (*) 3 - 12 %   Monocytes Absolute 1.6 (*) 0.1 - 1.0 K/uL   Eosinophils Relative 0  0 - 5 %   Eosinophils Absolute 0.0  0.0 - 0.7 K/uL   Basophils Relative 0  0 - 1 %   Basophils Absolute 0.0  0.0 - 0.1 K/uL  COMPREHENSIVE METABOLIC PANEL      Result Value Range   Sodium 133 (*) 135 - 145 mEq/L   Potassium 3.8  3.5 - 5.1 mEq/L   Chloride 93 (*) 96 - 112 mEq/L   CO2 27  19 - 32 mEq/L   Glucose, Bld 126 (*) 70 - 99 mg/dL   BUN  11  6 - 23 mg/dL   Creatinine, Ser 7.82  0.50 - 1.35 mg/dL   Calcium 9.1  8.4 - 95.6 mg/dL   Total Protein 7.9  6.0 - 8.3 g/dL   Albumin 3.2 (*) 3.5 - 5.2 g/dL   AST 44 (*) 0 - 37 U/L   ALT 35  0 - 53 U/L   Alkaline Phosphatase 184 (*) 39 - 117 U/L   Total Bilirubin 0.4  0.3 - 1.2 mg/dL   GFR calc non Af Amer >90  >90 mL/min   GFR calc Af Amer >90  >90 mL/min  LIPASE, BLOOD      Result Value Range   Lipase 29  11 - 59 U/L  LACTIC ACID, PLASMA      Result Value Range   Lactic Acid, Venous 0.7  0.5 - 2.2 mmol/L  URINALYSIS W MICROSCOPIC + REFLEX CULTURE      Result Value Range   Color, Urine YELLOW  YELLOW   APPearance CLEAR  CLEAR   Specific Gravity, Urine >1.030 (*) 1.005 - 1.030   pH 6.0  5.0 - 8.0   Glucose, UA NEGATIVE  NEGATIVE mg/dL   Hgb urine dipstick MODERATE (*) NEGATIVE   Bilirubin Urine SMALL (*) NEGATIVE   Ketones, ur TRACE (*) NEGATIVE mg/dL   Protein, ur 213 (*) NEGATIVE mg/dL   Urobilinogen, UA 0.2  0.0 - 1.0 mg/dL   Nitrite NEGATIVE  NEGATIVE   Leukocytes, UA NEGATIVE  NEGATIVE   WBC, UA 3-6  <3 WBC/hpf   RBC / HPF 0-2  <3 RBC/hpf   Bacteria, UA RARE  RARE   Squamous Epithelial / LPF RARE  RARE   Ct Abdomen Pelvis W Contrast 10/08/2013   CLINICAL DATA:  Abdominal  pain. History of cholecystectomy and appendectomy.  EXAM: CT ABDOMEN AND PELVIS WITH CONTRAST  TECHNIQUE: Multidetector CT imaging of the abdomen and pelvis was performed using the standard protocol following bolus administration of intravenous contrast.  CONTRAST:  50 mL OMNIPAQUE IOHEXOL 300 MG/ML SOLN, 100 mL OMNIPAQUE IOHEXOL 300 MG/ML SOLN  COMPARISON:  CT abdomen and pelvis 12/27/2004.  FINDINGS: Lung bases are clear.  No pleural or pericardial effusion.  The patient has diverticulitis with a large diverticular abscess and free air. Discrete measurement of the abscess is difficult the largest component centrally in the pelvis measures 4.7 cm AP x 5.2 cm transverse by 7.0 cm craniocaudal. Extra  luminal air tracks into the mesentery cephalad, to the right and and across the midline. The stomach and small bowel appear normal.  The adrenal glands, spleen, pancreas, biliary tree and liver appear normal. The gallbladder is been removed. No lymphadenopathy is identified. No focal bony abnormality.  IMPRESSION: Sigmoid diverticulitis with a large pelvic abscess tracking cephalad into the abdomen as described above. Associated free intraperitoneal air is identified.   Electronically Signed   By: Drusilla Kanner M.D.   On: 10/08/2013 16:38   Dg Abd Acute W/chest 10/08/2013   CLINICAL DATA:  Abdominal pain and vomiting  EXAM: ACUTE ABDOMEN SERIES (ABDOMEN 2 VIEW & CHEST 1 VIEW)  COMPARISON:  09/22/2013  FINDINGS: No small bowel dilatation or small bowel fluid levels identified. Air-filled loops of large bowel are identified with gas of to the level of the rectum. Prior cholecystectomy. Heart size and mediastinal contours are within normal limits. Both lungs are clear.  IMPRESSION: 1. No acute cardiopulmonary abnormalities. 2. Nonobstructive bowel gas pattern.   Electronically Signed   By: Signa Kell M.D.   On: 10/08/2013 15:15     1740:  Acute diverticulitis with perforation and abscess. Will start IV abx. Pt remains afebrile. Will also obtain influenza panel, given his other symptoms in HPI. Dx and testing d/w pt and family.  Questions answered.  Verb understanding, agreeable to admit.  T/C to General Surgery Dr. Lovell Sheehan, case discussed, including:  HPI, pertinent PM/SHx, VS/PE, dx testing, ED course and treatment:  Agreeable to admit, requests to write temporary orders, OK to have clears diet, obtain medical bed.      Laray Anger, DO 10/10/13 1539

## 2013-10-08 NOTE — Progress Notes (Signed)
ANTIBIOTIC CONSULT NOTE - INITIAL  Pharmacy Consult for Zosyn Indication: abdominal abscess  No Known Allergies  Patient Measurements: Height: 6\' 1"  (185.4 cm) Weight: 195 lb (88.451 kg) IBW/kg (Calculated) : 79.9  Vital Signs: Temp: 98.9 F (37.2 C) (12/24 1440) Temp src: Oral (12/24 1440) BP: 121/74 mmHg (12/24 1440) Pulse Rate: 97 (12/24 1440) Intake/Output from previous day:   Intake/Output from this shift:    Labs:  Recent Labs  10/08/13 1044 10/08/13 1456  WBC 13.8* 10.9*  HGB 13.6 12.8*  PLT 601* 567*  CREATININE  --  0.94   Estimated Creatinine Clearance: 107.4 ml/min (by C-G formula based on Cr of 0.94). No results found for this basename: VANCOTROUGH, VANCOPEAK, VANCORANDOM, GENTTROUGH, GENTPEAK, GENTRANDOM, TOBRATROUGH, TOBRAPEAK, TOBRARND, AMIKACINPEAK, AMIKACINTROU, AMIKACIN,  in the last 72 hours   Microbiology: No results found for this or any previous visit (from the past 720 hour(s)).  Medical History: Past Medical History  Diagnosis Date  . Hyperlipidemia   . Hypertension   . Reflux   . ED (erectile dysfunction)   . Prostatitis   . Pancreatitis, acute   . Diverticulosis     Medications:  Scheduled:  . sodium chloride       Assessment: 47yo male admitted with abdominal abscess.  Pt has good renal fxn.  Estimated Creatinine Clearance: 107.4 ml/min (by C-G formula based on Cr of 0.94).   Goal of Therapy:  Eradicate infection.  Plan:  Zosyn 3.375gm IV q8h, each dose over 4 hrs Monitor labs, renal fxn, and cultures  Valrie Hart A 10/08/2013,5:30 PM

## 2013-10-08 NOTE — H&P (Signed)
William [name] is an 47 y.o. male.   Chief Complaint: Abdominal pain with chills HPI: Patient is a 47 year old white male who was recently diagnosed with sigmoid diverticulitis with contained perforation, treated as an outpatient with ciprofloxacin and Flagyl who presented from his primary care doctor's office with a three-day history of worsening generalized myalgias, chills, and lower abdominal pain. He states this came on suddenly. Initially, it was felt to be the flu. He was started on Tamiflu. He was noted to have a leukocytosis in Dr. Brett Canales Luking's office. He subsequently was sent to the emergency room for CAT scan which revealed sigmoid diverticulitis with a contained perforation and a large amount of free air within the lower abdomen. There was not frank pneumoperitoneum under the diaphragms. Interestingly, the patient's white count was only slightly elevated in the emergency room.  Past Medical History  Diagnosis Date  . Hyperlipidemia   . Hypertension   . Reflux   . ED (erectile dysfunction)   . Prostatitis   . Pancreatitis, acute   . Diverticulosis     Past Surgical History  Procedure Laterality Date  . Knee surgery      Both  . Cholecystectomy    . Appendectomy    . Hernia repair    . Tonsillectomy    . Sphincterotomy      for sphincter of Oddi dysfunction    Family History  Problem Relation Age of Onset  . Cancer Father     Colon  . Cancer Other   . Diverticulitis Other    Social History:  reports that he has never smoked. He has never used smokeless tobacco. He reports that he drinks about 0.6 ounces of alcohol per week. He reports that he does not use illicit drugs.  Allergies: No Known Allergies  Medications Prior to Admission  Medication Sig Dispense Refill  . ibuprofen (ADVIL,MOTRIN) 800 MG tablet Take 800 mg by mouth every 8 (eight) hours as needed for moderate pain.      Marland Kitchen lovastatin (MEVACOR) 40 MG tablet Take 1 tablet (40 mg total) by mouth at  bedtime.  90 tablet  3  . omeprazole (PRILOSEC) 40 MG capsule Take 1 capsule (40 mg total) by mouth daily.  90 capsule  3  . ondansetron (ZOFRAN) 4 MG tablet Take 1 tablet (4 mg total) by mouth every 8 (eight) hours as needed for nausea or vomiting.  20 tablet  0  . oseltamivir (TAMIFLU) 75 MG capsule Take 1 capsule (75 mg total) by mouth 2 (two) times daily.  10 capsule  0  . oxyCODONE-acetaminophen (PERCOCET) 7.5-325 MG per tablet Take 1-2 tablets by mouth every 4 (four) hours as needed.  50 tablet  0  . ciprofloxacin (CIPRO) 500 MG tablet Take 1 tablet (500 mg total) by mouth 2 (two) times daily.  20 tablet  0  . tadalafil (CIALIS) 20 MG tablet Take 20 mg by mouth daily as needed for erectile dysfunction.        Results for orders placed during the hospital encounter of 10/08/13 (from the past 48 hour(s))  CBC WITH DIFFERENTIAL     Status: Abnormal   Collection Time    10/08/13  2:56 PM      Result Value Range   WBC 10.9 (*) 4.0 - 10.5 K/uL   RBC 4.40  4.22 - 5.81 MIL/uL   Hemoglobin 12.8 (*) 13.0 - 17.0 g/dL   HCT 21.3 (*) 08.6 - 57.8 %   MCV 85.2  78.0 - 100.0 fL   MCH 29.1  26.0 - 34.0 pg   MCHC 34.1  30.0 - 36.0 g/dL   RDW 16.1  09.6 - 04.5 %   Platelets 567 (*) 150 - 400 K/uL   Neutrophils Relative % 77  43 - 77 %   Neutro Abs 8.4 (*) 1.7 - 7.7 K/uL   Lymphocytes Relative 8 (*) 12 - 46 %   Lymphs Abs 0.9  0.7 - 4.0 K/uL   Monocytes Relative 14 (*) 3 - 12 %   Monocytes Absolute 1.6 (*) 0.1 - 1.0 K/uL   Eosinophils Relative 0  0 - 5 %   Eosinophils Absolute 0.0  0.0 - 0.7 K/uL   Basophils Relative 0  0 - 1 %   Basophils Absolute 0.0  0.0 - 0.1 K/uL  COMPREHENSIVE METABOLIC PANEL     Status: Abnormal   Collection Time    10/08/13  2:56 PM      Result Value Range   Sodium 133 (*) 135 - 145 mEq/L   Potassium 3.8  3.5 - 5.1 mEq/L   Chloride 93 (*) 96 - 112 mEq/L   CO2 27  19 - 32 mEq/L   Glucose, Bld 126 (*) 70 - 99 mg/dL   BUN 11  6 - 23 mg/dL   Creatinine, Ser 4.09   0.50 - 1.35 mg/dL   Calcium 9.1  8.4 - 81.1 mg/dL   Total Protein 7.9  6.0 - 8.3 g/dL   Albumin 3.2 (*) 3.5 - 5.2 g/dL   AST 44 (*) 0 - 37 U/L   ALT 35  0 - 53 U/L   Alkaline Phosphatase 184 (*) 39 - 117 U/L   Total Bilirubin 0.4  0.3 - 1.2 mg/dL   GFR calc non Af Amer >90  >90 mL/min   GFR calc Af Amer >90  >90 mL/min   Comment: (NOTE)     The eGFR has been calculated using the CKD EPI equation.     This calculation has not been validated in all clinical situations.     eGFR's persistently <90 mL/min signify possible Chronic Kidney     Disease.  LIPASE, BLOOD     Status: None   Collection Time    10/08/13  2:56 PM      Result Value Range   Lipase 29  11 - 59 U/L  LACTIC ACID, PLASMA     Status: None   Collection Time    10/08/13  3:33 PM      Result Value Range   Lactic Acid, Venous 0.7  0.5 - 2.2 mmol/L  URINALYSIS W MICROSCOPIC + REFLEX CULTURE     Status: Abnormal   Collection Time    10/08/13  3:47 PM      Result Value Range   Color, Urine YELLOW  YELLOW   APPearance CLEAR  CLEAR   Specific Gravity, Urine >1.030 (*) 1.005 - 1.030   pH 6.0  5.0 - 8.0   Glucose, UA NEGATIVE  NEGATIVE mg/dL   Hgb urine dipstick MODERATE (*) NEGATIVE   Bilirubin Urine SMALL (*) NEGATIVE   Ketones, ur TRACE (*) NEGATIVE mg/dL   Protein, ur 914 (*) NEGATIVE mg/dL   Urobilinogen, UA 0.2  0.0 - 1.0 mg/dL   Nitrite NEGATIVE  NEGATIVE   Leukocytes, UA NEGATIVE  NEGATIVE   WBC, UA 3-6  <3 WBC/hpf   RBC / HPF 0-2  <3 RBC/hpf   Bacteria, UA RARE  RARE  Squamous Epithelial / LPF RARE  RARE  INFLUENZA PANEL BY PCR     Status: Abnormal   Collection Time    10/08/13  6:26 PM      Result Value Range   Influenza A By PCR POSITIVE (*) NEGATIVE   Comment: RESULT CALLED TO, READ BACK BY AND VERIFIED WITH:     MARTIN M AT 2015 ON [id number] BY FORSYTH K   Influenza B By PCR NEGATIVE  NEGATIVE   H1N1 flu by pcr NOT DETECTED  NOT DETECTED   Comment:            The Xpert Flu assay (FDA approved for      nasal aspirates or washes and     nasopharyngeal swab specimens), is     intended as an aid in the diagnosis of     influenza and should not be used as     a sole basis for treatment.   Ct Abdomen Pelvis W Contrast  10/08/2013   CLINICAL DATA:  Abdominal pain. History of cholecystectomy and appendectomy.  EXAM: CT ABDOMEN AND PELVIS WITH CONTRAST  TECHNIQUE: Multidetector CT imaging of the abdomen and pelvis was performed using the standard protocol following bolus administration of intravenous contrast.  CONTRAST:  50 mL OMNIPAQUE IOHEXOL 300 MG/ML SOLN, 100 mL OMNIPAQUE IOHEXOL 300 MG/ML SOLN  COMPARISON:  CT abdomen and pelvis 12/27/2004.  FINDINGS: Lung bases are clear.  No pleural or pericardial effusion.  The patient has diverticulitis with a large diverticular abscess and free air. Discrete measurement of the abscess is difficult the largest component centrally in the pelvis measures 4.7 cm AP x 5.2 cm transverse by 7.0 cm craniocaudal. Extra luminal air tracks into the mesentery cephalad, to the right and and across the midline. The stomach and small bowel appear normal.  The adrenal glands, spleen, pancreas, biliary tree and liver appear normal. The gallbladder is been removed. No lymphadenopathy is identified. No focal bony abnormality.  IMPRESSION: Sigmoid diverticulitis with a large pelvic abscess tracking cephalad into the abdomen as described above. Associated free intraperitoneal air is identified.   Electronically Signed   By: Drusilla Kanner M.D.   On: 10/08/2013 16:38   Dg Abd Acute W/chest  10/08/2013   CLINICAL DATA:  Abdominal pain and vomiting  EXAM: ACUTE ABDOMEN SERIES (ABDOMEN 2 VIEW & CHEST 1 VIEW)  COMPARISON:  09/22/2013  FINDINGS: No small bowel dilatation or small bowel fluid levels identified. Air-filled loops of large bowel are identified with gas of to the level of the rectum. Prior cholecystectomy. Heart size and mediastinal contours are within normal limits. Both  lungs are clear.  IMPRESSION: 1. No acute cardiopulmonary abnormalities. 2. Nonobstructive bowel gas pattern.   Electronically Signed   By: Signa Kell M.D.   On: 10/08/2013 15:15    Review of Systems  Constitutional: Positive for chills and malaise/fatigue.  HENT: Negative.   Respiratory: Negative.   Cardiovascular: Negative.   Gastrointestinal: Positive for abdominal pain.  Genitourinary: Negative.   Musculoskeletal: Positive for myalgias.  Skin: Negative.   Endo/Heme/Allergies: Negative.   All other systems reviewed and are negative.    Blood pressure 116/71, pulse 95, temperature 100.4 F (38 C), temperature source Oral, resp. rate 20, height 6\' 1"  (1.854 m), weight 88.451 kg (195 lb), SpO2 96.00%. Physical Exam  Vitals reviewed. Constitutional: He is oriented to person, place, and time. He appears well-developed and well-nourished.  HENT:  Head: Normocephalic and atraumatic.  Neck: Normal range of motion.  Neck supple.  Cardiovascular: Normal rate, regular rhythm and normal heart sounds.   Respiratory: Effort normal and breath sounds normal.  GI: Soft. He exhibits no distension. There is tenderness. There is guarding. There is no rebound.  Tender in the suprapubic and left lower quadrant regions. No rigidity noted. Bowel sounds present.  Neurological: He is alert and oriented to person, place, and time.  Skin: Skin is warm and dry.     Assessment/Plan Impression: Sigmoid diverticulitis with perforation and abscess formation Plan: Patient does not need acute surgical intervention. He will subsequently undergo a partial colectomy in the future. We'll get CT-guided drainage of the intra-abdominal abscess in order to facilitate a primary resection and anastomosis, avoiding a colostomy bag. He will be started on IV Zosyn. Will be continued on Tamiflu. The management plan has been explained to the patient, who agrees.  Lamondre Wesche A 10/08/2013, 9:50 PM

## 2013-10-09 ENCOUNTER — Ambulatory Visit (HOSPITAL_COMMUNITY)
Admission: RE | Admit: 2013-10-09 | Discharge: 2013-10-09 | Disposition: A | Discharge status: Home / Self Care | Payer: BC Managed Care – PPO | Source: Ambulatory Visit | Attending: Interventional Radiology | Admitting: Interventional Radiology

## 2013-10-09 ENCOUNTER — Inpatient Hospital Stay (HOSPITAL_COMMUNITY)
Admission: RE | Admit: 2013-10-09 | Discharge: 2013-10-09 | Disposition: A | Discharge status: Home / Self Care | Payer: BC Managed Care – PPO | Source: Ambulatory Visit | Attending: General Surgery | Admitting: General Surgery

## 2013-10-09 ENCOUNTER — Encounter (HOSPITAL_COMMUNITY): Payer: Self-pay | Admitting: Radiology

## 2013-10-09 MED ORDER — ONDANSETRON HCL 4 MG/2ML IJ SOLN
4.0000 mg | Freq: Once | INTRAMUSCULAR | Status: AC
  Administered 2013-10-09: 4 mg via INTRAVENOUS
  Filled 2013-10-09: qty 2

## 2013-10-09 MED ORDER — MIDAZOLAM HCL 2 MG/2ML IJ SOLN
INTRAMUSCULAR | Status: AC | PRN
  Administered 2013-10-09: 2 mg via INTRAVENOUS

## 2013-10-09 MED ORDER — FENTANYL CITRATE 0.05 MG/ML IJ SOLN
INTRAMUSCULAR | Status: AC | PRN
  Administered 2013-10-09: 25 ug via INTRAVENOUS

## 2013-10-09 MED ORDER — FENTANYL CITRATE 0.05 MG/ML IJ SOLN
INTRAMUSCULAR | Status: AC | PRN
  Administered 2013-10-09: 25 ug via INTRAVENOUS
  Administered 2013-10-09: 50 ug via INTRAVENOUS

## 2013-10-09 NOTE — H&P (Signed)
HPI: William [name] is an 47 y.o. male with diverticulitis and abscess. IR is asked to perc drain. Chart, PMHx, meds reviewed. Pt has been NPO  Past Medical History:  Past Medical History  Diagnosis Date  . Hyperlipidemia   . Hypertension   . Reflux   . ED (erectile dysfunction)   . Prostatitis   . Pancreatitis, acute   . Diverticulosis     Past Surgical History:  Past Surgical History  Procedure Laterality Date  . Knee surgery      Both  . Cholecystectomy    . Appendectomy    . Hernia repair    . Tonsillectomy    . Sphincterotomy      for sphincter of Oddi dysfunction    Family History:  Family History  Problem Relation Age of Onset  . Cancer Father     Colon  . Cancer Other   . Diverticulitis Other     Social History:  reports that he has never smoked. He has never used smokeless tobacco. He reports that he drinks about 0.6 ounces of alcohol per week. He reports that he does not use illicit drugs.  Allergies: No Known Allergies  Medications:   Medication List    ASK your doctor about these medications       ciprofloxacin 500 MG tablet  Commonly known as:  CIPRO  Take 1 tablet (500 mg total) by mouth 2 (two) times daily.     ibuprofen 800 MG tablet  Commonly known as:  ADVIL,MOTRIN  Take 800 mg by mouth every 8 (eight) hours as needed for moderate pain.     lovastatin 40 MG tablet  Commonly known as:  MEVACOR  Take 1 tablet (40 mg total) by mouth at bedtime.     omeprazole 40 MG capsule  Commonly known as:  PRILOSEC  Take 1 capsule (40 mg total) by mouth daily.     ondansetron 4 MG tablet  Commonly known as:  ZOFRAN  Take 1 tablet (4 mg total) by mouth every 8 (eight) hours as needed for nausea or vomiting.     oseltamivir 75 MG capsule  Commonly known as:  TAMIFLU  Take 1 capsule (75 mg total) by mouth 2 (two) times daily.     oxyCODONE-acetaminophen 7.5-325 MG per tablet  Commonly known as:  PERCOCET  Take 1-2 tablets by mouth every 4  (four) hours as needed.     tadalafil 20 MG tablet  Commonly known as:  CIALIS  Take 20 mg by mouth daily as needed for erectile dysfunction.        Please HPI for pertinent positives, otherwise complete 10 system ROS negative.  Physical Exam: BP 107/66  Pulse 74  Temp(Src) 98 F (36.7 C) (Oral)  Resp 20  Ht 6\' 1"  (1.854 m)  Wt 194 lb 14.2 oz (88.4 kg)  BMI 25.72 kg/m2  SpO2 99% Body mass index is 25.72 kg/(m^2).   General Appearance:  Alert, cooperative, no distress, appears stated age  Head:  Normocephalic, without obvious abnormality, atraumatic  ENT: Unremarkable  Neck: Supple, symmetrical, trachea midline  Lungs:   Clear to auscultation bilaterally, no w/r/r, respirations unlabored without use of accessory muscles.  Chest Wall:  No tenderness or deformity  Heart:  Regular rate and rhythm, S1, S2 normal, no murmur, rub or gallop.  Abdomen:   Soft, ND. Mildly tender low abd.  Neurologic: Normal affect, no gross deficits.   Results for orders placed during the hospital encounter of  10/08/13 (from the past 48 hour(s))  CBC WITH DIFFERENTIAL     Status: Abnormal   Collection Time    10/08/13  2:56 PM      Result Value Range   WBC 10.9 (*) 4.0 - 10.5 K/uL   RBC 4.40  4.22 - 5.81 MIL/uL   Hemoglobin 12.8 (*) 13.0 - 17.0 g/dL   HCT 16.1 (*) 09.6 - 04.5 %   MCV 85.2  78.0 - 100.0 fL   MCH 29.1  26.0 - 34.0 pg   MCHC 34.1  30.0 - 36.0 g/dL   RDW 40.9  81.1 - 91.4 %   Platelets 567 (*) 150 - 400 K/uL   Neutrophils Relative % 77  43 - 77 %   Neutro Abs 8.4 (*) 1.7 - 7.7 K/uL   Lymphocytes Relative 8 (*) 12 - 46 %   Lymphs Abs 0.9  0.7 - 4.0 K/uL   Monocytes Relative 14 (*) 3 - 12 %   Monocytes Absolute 1.6 (*) 0.1 - 1.0 K/uL   Eosinophils Relative 0  0 - 5 %   Eosinophils Absolute 0.0  0.0 - 0.7 K/uL   Basophils Relative 0  0 - 1 %   Basophils Absolute 0.0  0.0 - 0.1 K/uL  COMPREHENSIVE METABOLIC PANEL     Status: Abnormal   Collection Time    10/08/13  2:56 PM       Result Value Range   Sodium 133 (*) 135 - 145 mEq/L   Potassium 3.8  3.5 - 5.1 mEq/L   Chloride 93 (*) 96 - 112 mEq/L   CO2 27  19 - 32 mEq/L   Glucose, Bld 126 (*) 70 - 99 mg/dL   BUN 11  6 - 23 mg/dL   Creatinine, Ser 7.82  0.50 - 1.35 mg/dL   Calcium 9.1  8.4 - 95.6 mg/dL   Total Protein 7.9  6.0 - 8.3 g/dL   Albumin 3.2 (*) 3.5 - 5.2 g/dL   AST 44 (*) 0 - 37 U/L   ALT 35  0 - 53 U/L   Alkaline Phosphatase 184 (*) 39 - 117 U/L   Total Bilirubin 0.4  0.3 - 1.2 mg/dL   GFR calc non Af Amer >90  >90 mL/min   GFR calc Af Amer >90  >90 mL/min   Comment: (NOTE)     The eGFR has been calculated using the CKD EPI equation.     This calculation has not been validated in all clinical situations.     eGFR's persistently <90 mL/min signify possible Chronic Kidney     Disease.  LIPASE, BLOOD     Status: None   Collection Time    10/08/13  2:56 PM      Result Value Range   Lipase 29  11 - 59 U/L  PROTIME-INR     Status: None   Collection Time    10/08/13  2:57 PM      Result Value Range   Prothrombin Time 14.1  11.6 - 15.2 seconds   INR 1.11  0.00 - 1.49  LACTIC ACID, PLASMA     Status: None   Collection Time    10/08/13  3:33 PM      Result Value Range   Lactic Acid, Venous 0.7  0.5 - 2.2 mmol/L  URINALYSIS W MICROSCOPIC + REFLEX CULTURE     Status: Abnormal   Collection Time    10/08/13  3:47 PM      Result  Value Range   Color, Urine YELLOW  YELLOW   APPearance CLEAR  CLEAR   Specific Gravity, Urine >1.030 (*) 1.005 - 1.030   pH 6.0  5.0 - 8.0   Glucose, UA NEGATIVE  NEGATIVE mg/dL   Hgb urine dipstick MODERATE (*) NEGATIVE   Bilirubin Urine SMALL (*) NEGATIVE   Ketones, ur TRACE (*) NEGATIVE mg/dL   Protein, ur 478 (*) NEGATIVE mg/dL   Urobilinogen, UA 0.2  0.0 - 1.0 mg/dL   Nitrite NEGATIVE  NEGATIVE   Leukocytes, UA NEGATIVE  NEGATIVE   WBC, UA 3-6  <3 WBC/hpf   RBC / HPF 0-2  <3 RBC/hpf   Bacteria, UA RARE  RARE   Squamous Epithelial / LPF RARE  RARE  INFLUENZA  PANEL BY PCR     Status: Abnormal   Collection Time    10/08/13  6:26 PM      Result Value Range   Influenza A By PCR POSITIVE (*) NEGATIVE   Comment: RESULT CALLED TO, READ BACK BY AND VERIFIED WITH:     MARTIN M AT 2015 ON [id number] BY FORSYTH K   Influenza B By PCR NEGATIVE  NEGATIVE   H1N1 flu by pcr NOT DETECTED  NOT DETECTED   Comment:            The Xpert Flu assay (FDA approved for     nasal aspirates or washes and     nasopharyngeal swab specimens), is     intended as an aid in the diagnosis of     influenza and should not be used as     a sole basis for treatment.   Ct Abdomen Pelvis W Contrast  10/08/2013   CLINICAL DATA:  Abdominal pain. History of cholecystectomy and appendectomy.  EXAM: CT ABDOMEN AND PELVIS WITH CONTRAST  TECHNIQUE: Multidetector CT imaging of the abdomen and pelvis was performed using the standard protocol following bolus administration of intravenous contrast.  CONTRAST:  50 mL OMNIPAQUE IOHEXOL 300 MG/ML SOLN, 100 mL OMNIPAQUE IOHEXOL 300 MG/ML SOLN  COMPARISON:  CT abdomen and pelvis 12/27/2004.  FINDINGS: Lung bases are clear.  No pleural or pericardial effusion.  The patient has diverticulitis with a large diverticular abscess and free air. Discrete measurement of the abscess is difficult the largest component centrally in the pelvis measures 4.7 cm AP x 5.2 cm transverse by 7.0 cm craniocaudal. Extra luminal air tracks into the mesentery cephalad, to the right and and across the midline. The stomach and small bowel appear normal.  The adrenal glands, spleen, pancreas, biliary tree and liver appear normal. The gallbladder is been removed. No lymphadenopathy is identified. No focal bony abnormality.  IMPRESSION: Sigmoid diverticulitis with a large pelvic abscess tracking cephalad into the abdomen as described above. Associated free intraperitoneal air is identified.   Electronically Signed   By: Drusilla Kanner M.D.   On: 10/08/2013 16:38   Dg Abd Acute  W/chest  10/08/2013   CLINICAL DATA:  Abdominal pain and vomiting  EXAM: ACUTE ABDOMEN SERIES (ABDOMEN 2 VIEW & CHEST 1 VIEW)  COMPARISON:  09/22/2013  FINDINGS: No small bowel dilatation or small bowel fluid levels identified. Air-filled loops of large bowel are identified with gas of to the level of the rectum. Prior cholecystectomy. Heart size and mediastinal contours are within normal limits. Both lungs are clear.  IMPRESSION: 1. No acute cardiopulmonary abnormalities. 2. Nonobstructive bowel gas pattern.   Electronically Signed   By: Signa Kell M.D.   On:  10/08/2013 15:15    Assessment/Plan Diverticulitis with contained perf/abscess For CT guided abscess drainage. Discussed procedure, risks, complications, use of sedation. Labs reviewed. Consent signed in chart  Brayton El PA-C 10/09/2013, 10:58 AM

## 2013-10-09 NOTE — Procedures (Signed)
12 Fr abd abscess drain Pus No comp

## 2013-10-09 NOTE — Progress Notes (Signed)
Subjective: Had CT-guided drainage of intra-abdominal abscess today. Patient states he feels much better.  Objective: Vital signs in last 24 hours: Temp:  [98 F (36.7 C)-100.4 F (38 C)] 98 F (36.7 C) (12/25 0950) Pulse Rate:  [74-101] 85 (12/25 1240) Resp:  [13-24] 20 (12/25 1240) BP: (96-117)/(48-71) 117/61 mmHg (12/25 1240) SpO2:  [94 %-99 %] 99 % (12/25 1240) Weight:  [88.4 kg (194 lb 14.2 oz)] 88.4 kg (194 lb 14.2 oz) (12/24 2018) Last BM Date: 10/07/13  Intake/Output from previous day:   Intake/Output this shift: Total I/O In: -  Out: 70 [Drains:70]  General appearance: alert, cooperative and no distress GI: Soft. Drainage tube with purulent drainage present. No rigidity noted.  Lab Results:   Recent Labs  10/08/13 1044 10/08/13 1456  WBC 13.8* 10.9*  HGB 13.6 12.8*  HCT 39.4 37.5*  PLT 601* 567*   BMET  Recent Labs  10/08/13 1456  NA 133*  K 3.8  CL 93*  CO2 27  GLUCOSE 126*  BUN 11  CREATININE 0.94  CALCIUM 9.1   PT/INR  Recent Labs  10/08/13 1457  LABPROT 14.1  INR 1.11    Studies/Results: Ct Abdomen Pelvis W Contrast  10/08/2013   CLINICAL DATA:  Abdominal pain. History of cholecystectomy and appendectomy.  EXAM: CT ABDOMEN AND PELVIS WITH CONTRAST  TECHNIQUE: Multidetector CT imaging of the abdomen and pelvis was performed using the standard protocol following bolus administration of intravenous contrast.  CONTRAST:  50 mL OMNIPAQUE IOHEXOL 300 MG/ML SOLN, 100 mL OMNIPAQUE IOHEXOL 300 MG/ML SOLN  COMPARISON:  CT abdomen and pelvis 12/27/2004.  FINDINGS: Lung bases are clear.  No pleural or pericardial effusion.  The patient has diverticulitis with a large diverticular abscess and free air. Discrete measurement of the abscess is difficult the largest component centrally in the pelvis measures 4.7 cm AP x 5.2 cm transverse by 7.0 cm craniocaudal. Extra luminal air tracks into the mesentery cephalad, to the right and and across the midline.  The stomach and small bowel appear normal.  The adrenal glands, spleen, pancreas, biliary tree and liver appear normal. The gallbladder is been removed. No lymphadenopathy is identified. No focal bony abnormality.  IMPRESSION: Sigmoid diverticulitis with a large pelvic abscess tracking cephalad into the abdomen as described above. Associated free intraperitoneal air is identified.   Electronically Signed   By: Drusilla Kanner M.D.   On: 10/08/2013 16:38   Dg Abd Acute W/chest  10/08/2013   CLINICAL DATA:  Abdominal pain and vomiting  EXAM: ACUTE ABDOMEN SERIES (ABDOMEN 2 VIEW & CHEST 1 VIEW)  COMPARISON:  09/22/2013  FINDINGS: No small bowel dilatation or small bowel fluid levels identified. Air-filled loops of large bowel are identified with gas of to the level of the rectum. Prior cholecystectomy. Heart size and mediastinal contours are within normal limits. Both lungs are clear.  IMPRESSION: 1. No acute cardiopulmonary abnormalities. 2. Nonobstructive bowel gas pattern.   Electronically Signed   By: Signa Kell M.D.   On: 10/08/2013 15:15    Anti-infectives: Anti-infectives   Start     Dose/Rate Route Frequency Ordered Stop   10/08/13 2200  oseltamivir (TAMIFLU) capsule 75 mg     75 mg Oral 2 times daily 10/08/13 2147 10/13/13 2159   10/08/13 1745  piperacillin-tazobactam (ZOSYN) IVPB 3.375 g     3.375 g 12.5 mL/hr over 240 Minutes Intravenous Every 8 hours 10/08/13 1730        Assessment/Plan: Impression: Status post successful drainage  of intra-abdominal abscess by CT guidance, tube still in place. Plan: We'll advance to full liquid diet. Plan on leaving tube in for complete drainage. Will need elective partial colectomy as an outpatient. Anticipate discharge in next 24-48 hours. Appreciate interventional radiology assistance.  LOS: 1 day    Ivanell Deshotel A 10/09/2013

## 2013-10-10 LAB — CBC
HCT: 36 % — ABNORMAL LOW (ref 39.0–52.0)
Hemoglobin: 12.3 g/dL — ABNORMAL LOW (ref 13.0–17.0)
MCH: 29.1 pg (ref 26.0–34.0)
MCHC: 34.2 g/dL (ref 30.0–36.0)
RBC: 4.23 MIL/uL (ref 4.22–5.81)
WBC: 6.1 10*3/uL (ref 4.0–10.5)

## 2013-10-10 MED ORDER — CIPROFLOXACIN HCL 500 MG PO TABS: 500.0000 mg | ORAL_TABLET | Freq: Two times a day (BID) | ORAL | Status: DC

## 2013-10-10 NOTE — Progress Notes (Signed)
Patient received discharge instructions along with follow up appointments and prescriptions. Patient demonstrated how to flush drain. Patient verbalized understanding of all instructions. Patient was escorted by staff via wheelchair to vehicle. Patient was discharged home in stable condition.

## 2013-10-10 NOTE — Progress Notes (Signed)
Subjective: Discussed pt status with RN this am. She reports he feels pretty good with the exception of some nausea. Pain improved.  Objective: Physical Exam: BP 91/68  Pulse 73  Temp(Src) 98.2 F (36.8 C) (Oral)  Resp 20  Ht 6\' 1"  (1.854 m)  Wt 194 lb 14.2 oz (88.4 kg)  BMI 25.72 kg/m2  SpO2 100% Drain is in position per [employer]. Functioning well, purulent output. Cultures: mixed flora on gram stain so far.   Labs: CBC  Recent Labs  10/08/13 1456 10/10/13 0538  WBC 10.9* 6.1  HGB 12.8* 12.3*  HCT 37.5* 36.0*  PLT 567* 492*   BMET  Recent Labs  10/08/13 1456  NA 133*  K 3.8  CL 93*  CO2 27  GLUCOSE 126*  BUN 11  CREATININE 0.94  CALCIUM 9.1   LFT  Recent Labs  10/08/13 1456  PROT 7.9  ALBUMIN 3.2*  AST 44*  ALT 35  ALKPHOS 184*  BILITOT 0.4  LIPASE 29   PT/INR  Recent Labs  10/08/13 1457  LABPROT 14.1  INR 1.11     Studies/Results: Ct Abdomen Pelvis W Contrast  10/08/2013   CLINICAL DATA:  Abdominal pain. History of cholecystectomy and appendectomy.  EXAM: CT ABDOMEN AND PELVIS WITH CONTRAST  TECHNIQUE: Multidetector CT imaging of the abdomen and pelvis was performed using the standard protocol following bolus administration of intravenous contrast.  CONTRAST:  50 mL OMNIPAQUE IOHEXOL 300 MG/ML SOLN, 100 mL OMNIPAQUE IOHEXOL 300 MG/ML SOLN  COMPARISON:  CT abdomen and pelvis 12/27/2004.  FINDINGS: Lung bases are clear.  No pleural or pericardial effusion.  The patient has diverticulitis with a large diverticular abscess and free air. Discrete measurement of the abscess is difficult the largest component centrally in the pelvis measures 4.7 cm AP x 5.2 cm transverse by 7.0 cm craniocaudal. Extra luminal air tracks into the mesentery cephalad, to the right and and across the midline. The stomach and small bowel appear normal.  The adrenal glands, spleen, pancreas, biliary tree and liver appear normal. The gallbladder is been removed. No lymphadenopathy  is identified. No focal bony abnormality.  IMPRESSION: Sigmoid diverticulitis with a large pelvic abscess tracking cephalad into the abdomen as described above. Associated free intraperitoneal air is identified.   Electronically Signed   By: Drusilla Kanner M.D.   On: 10/08/2013 16:38   Dg Abd Acute W/chest  10/08/2013   CLINICAL DATA:  Abdominal pain and vomiting  EXAM: ACUTE ABDOMEN SERIES (ABDOMEN 2 VIEW & CHEST 1 VIEW)  COMPARISON:  09/22/2013  FINDINGS: No small bowel dilatation or small bowel fluid levels identified. Air-filled loops of large bowel are identified with gas of to the level of the rectum. Prior cholecystectomy. Heart size and mediastinal contours are within normal limits. Both lungs are clear.  IMPRESSION: 1. No acute cardiopulmonary abnormalities. 2. Nonobstructive bowel gas pattern.   Electronically Signed   By: Signa Kell M.D.   On: 10/08/2013 15:15    Assessment/Plan: Pelvic abscess from diverticulitis. S/p perc drain 12/25 WBC down Cx pending Plans per Surgical team. Recommend at least daily flush with 5mL NS after discharge to ensure proper drain function. Contact IR if further help needed.  Brayton El PA-C Interventional Radiology 10/10/2013 8:21 AM     LOS: 2 days    Brayton El PA-C 10/10/2013 8:18 AM

## 2013-10-10 NOTE — Discharge Summary (Signed)
Physician Discharge Summary  Patient ID: William [name] MRN: [id number] DOB/AGE: 1966/03/07 47 y.o.  Admit date: 10/08/2013 Discharge date: 10/10/2013  Admission Diagnoses: Sigmoid diverticulitis with perforation and intra-abdominal abscess  Discharge Diagnoses: Same Active Problems:   Intra-abdominal abscess   Diverticulitis with perforation   Discharged Condition: good  Hospital Course: Patient is a 48 year old white male with a history of sigmoid diverticulitis who presented on 10/08/2013 with worsening abdominal pain and leukocytosis. He was found to have an intra-abdominal abscess. He had been in the hospital several weeks earlier and had been treated as an outpatient with ciprofloxacin. He did not tolerate Flagyl. He was admitted to the hospital for further evaluation treatment. He underwent CT-guided percutaneous drainage of the intra-abdominal abscess on 10/09/2013. Tolerated procedure well. His leukocytosis has resolved. He is being discharged home on 10/10/2013 in good improving condition with a drainage catheter in place.  Treatments: procedures: percutaneous drainage catheter placement  Discharge Exam: Blood pressure 91/68, pulse 73, temperature 98.2 F (36.8 C), temperature source Oral, resp. rate 20, height 6\' 1"  (1.854 m), weight 88.4 kg (194 lb 14.2 oz), SpO2 100.00%. General appearance: alert, cooperative and no distress Resp: clear to auscultation bilaterally Cardio: regular rate and rhythm, S1, S2 normal, no murmur, click, rub or gallop GI: soft, non-tender; bowel sounds normal; no masses,  no organomegaly and Drainage catheter in place with purulent fluid.  Disposition: 01-Home or Self Care     Medication List         ciprofloxacin 500 MG tablet  Commonly known as:  CIPRO  Take 1 tablet (500 mg total) by mouth 2 (two) times daily.     ibuprofen 800 MG tablet  Commonly known as:  ADVIL,MOTRIN  Take 800 mg by mouth every 8 (eight) hours as needed for  moderate pain.     lovastatin 40 MG tablet  Commonly known as:  MEVACOR  Take 1 tablet (40 mg total) by mouth at bedtime.     omeprazole 40 MG capsule  Commonly known as:  PRILOSEC  Take 1 capsule (40 mg total) by mouth daily.     ondansetron 4 MG tablet  Commonly known as:  ZOFRAN  Take 1 tablet (4 mg total) by mouth every 8 (eight) hours as needed for nausea or vomiting.     oseltamivir 75 MG capsule  Commonly known as:  TAMIFLU  Take 1 capsule (75 mg total) by mouth 2 (two) times daily.     oxyCODONE-acetaminophen 7.5-325 MG per tablet  Commonly known as:  PERCOCET  Take 1-2 tablets by mouth every 4 (four) hours as needed.     tadalafil 20 MG tablet  Commonly known as:  CIALIS  Take 20 mg by mouth daily as needed for erectile dysfunction.           Follow-up Information   Follow up with Dalia Heading, MD. Schedule an appointment as soon as possible for a visit on 10/14/2013.   Specialty:  General Surgery   Contact information:   [address] [place] [address] [phone]       Signed: Franky Macho A 10/10/2013, 9:47 AM

## 2013-10-12 LAB — CULTURE, ROUTINE-ABSCESS

## 2013-10-13 ENCOUNTER — Telehealth: Payer: Self-pay | Admitting: Family Medicine

## 2013-10-13 NOTE — Telephone Encounter (Signed)
appt here first

## 2013-10-13 NOTE — Telephone Encounter (Signed)
Patient would like a referral for his colon correctional surgery. He has heard positive feedback from [facility] with [facility], but would like your opinion on who he should go to for this.

## 2013-10-13 NOTE — Telephone Encounter (Signed)
Office visit scheduled.

## 2013-10-14 LAB — ANAEROBIC CULTURE

## 2013-10-15 ENCOUNTER — Encounter: Payer: Self-pay | Admitting: Family Medicine

## 2013-10-15 ENCOUNTER — Ambulatory Visit (INDEPENDENT_AMBULATORY_CARE_PROVIDER_SITE_OTHER): Payer: BC Managed Care – PPO | Admitting: Family Medicine

## 2013-10-15 VITALS — BP 134/92 | Ht 73.0 in | Wt 196.0 lb

## 2013-10-15 DIAGNOSIS — K5780 Diverticulitis of intestine, part unspecified, with perforation and abscess without bleeding: Secondary | ICD-10-CM

## 2013-10-15 DIAGNOSIS — K5732 Diverticulitis of large intestine without perforation or abscess without bleeding: Secondary | ICD-10-CM

## 2013-10-15 MED ORDER — SODIUM CHLORIDE 0.9 % IJ SOLN: 10.0000 mL | Freq: Two times a day (BID) | INTRAMUSCULAR | Status: AC

## 2013-10-15 NOTE — Progress Notes (Signed)
   Subjective:    Patient ID: William [name], male    DOB: 1966/07/12, 47 y.o.   MRN: [id number]  HPI  Patient is here today to get a referral for colon surgery.   Has had recent tapping of abscess, still having difficulty,  Has challenges with abd surg,  Pain better, not the best appetite  ppetite decent  No fever and chills,  Slight cough persisting for the flu None evident--cipro for five days  abd--[phone]  Review of Systems An in and chest pain no back pain some recent constipation. No blood in stool no fever no chills ROS otherwise negative    Objective:   Physical Exam  Alert no apparent distress. Lungs clear. Heart rare rhythm. H&T normal. Abdomen Bowel sounds. No discrete tenderness. Mild lobe domino discomfort. Drain present. Actively draining clear fluid.      Assessment & Plan:  Impression diverticulitis with abscess and subsequent recurrence. General surgeon has advised partial colectomy. Patient would prefer this done in a larger institution such as [facility]. WSL the discussed at length we'll proceed with consult 25 minutes spent most in discussion

## 2013-10-17 ENCOUNTER — Telehealth: Payer: Self-pay | Admitting: Family Medicine

## 2013-10-17 NOTE — Telephone Encounter (Signed)
Pt states the [facility] scheduled him with Dr. Leighton Ruff [birth year]36 whom is brand new and he only wants to see Dr. Excell Seltzer, his next available consult is 11/10/13, pt was told that we needed to call to expedite an urgent referral to Dr. Excell Seltzer, please call [phone] to try to get pt seen sooner.

## 2013-10-20 ENCOUNTER — Telehealth (INDEPENDENT_AMBULATORY_CARE_PROVIDER_SITE_OTHER): Payer: Self-pay

## 2013-10-20 NOTE — Telephone Encounter (Signed)
Called and spoke to patient regarding upcoming appointment.  Per patient request appointment has been rescheduled to see Dr. Excell Seltzer on 10/23/13 @ 11:15 am.

## 2013-10-20 NOTE — Telephone Encounter (Signed)
Nurses let me spk to dr Excell Seltzer tod, brendale call and hold off on tom appt with dr pt does not want to see

## 2013-10-20 NOTE — Telephone Encounter (Signed)
i spoke with dr Excell Seltzer, he said he'd get it straightened out fr his side to see pt

## 2013-10-20 NOTE — Telephone Encounter (Signed)
Dr paged and waiting for call back

## 2013-10-21 ENCOUNTER — Ambulatory Visit (INDEPENDENT_AMBULATORY_CARE_PROVIDER_SITE_OTHER): Payer: BC Managed Care – PPO | Admitting: General Surgery

## 2013-10-23 ENCOUNTER — Encounter (INDEPENDENT_AMBULATORY_CARE_PROVIDER_SITE_OTHER): Payer: Self-pay | Admitting: General Surgery

## 2013-10-23 ENCOUNTER — Ambulatory Visit (INDEPENDENT_AMBULATORY_CARE_PROVIDER_SITE_OTHER): Payer: BC Managed Care – PPO | Admitting: General Surgery

## 2013-10-23 VITALS — BP 121/81 | HR 76 | Temp 97.4°F | Resp 18 | Ht 73.0 in | Wt 193.6 lb

## 2013-10-23 DIAGNOSIS — K5732 Diverticulitis of large intestine without perforation or abscess without bleeding: Secondary | ICD-10-CM

## 2013-10-23 DIAGNOSIS — K5720 Diverticulitis of large intestine with perforation and abscess without bleeding: Secondary | ICD-10-CM

## 2013-10-23 MED ORDER — OXYCODONE-ACETAMINOPHEN 7.5-325 MG PO TABS: 1.0000 | ORAL_TABLET | ORAL | Status: DC | PRN

## 2013-10-23 NOTE — Progress Notes (Signed)
Subjective:   diverticulitis and diverticular abscess  Patient ID: William [name], male   DOB: 04/02/1966, 48 y.o.   MRN: [id number]  HPI Patient is a 48 year old male referred by Dr.Luking for diverticulitis and diverticular abscess. He has had no previous history of diverticulitis until this illness. The first week of December he developed acute lower abdominal pain mostly on the left side and was admitted in [place]. A CT scan at that time revealed acute sigmoid diverticulitis with a small localized perforation. The patient was treated with IV antibiotics and improved over 5 or 6 days and was discharged on oral antibiotics. He however continued to have some significant pain for the next week or so. He however got to the point where he was feeling pretty well but then developed acute more diffuse abdominal pain and was readmitted on 1219. At that time CT scan which I reviewed showed a large pericolonic abscess up to about 6 or 7 cm and some significant free air in the abdominal cavity as well. The patient underwent percutaneous drainage and very rapidly improved following this and was discharged on oral antibiotics. He did not tolerate the Flagyl due to nausea but remained on Cipro which he finished a couple of days ago. His pain has gradually resolved. He still has some mild lower abdominal discomfort at times and some pain around the drain site. Denies fever or chills. Bowels are moving okay. He is tolerating a soft diet. He continues to have purulent drainage from his drainage catheter from his estimation about 20-30 cc per day. He is irrigating this at home.  Past Medical History  Diagnosis Date  . Hyperlipidemia   . Hypertension   . Reflux   . ED (erectile dysfunction)   . Prostatitis   . Pancreatitis, acute   . Diverticulosis    Past Surgical History  Procedure Laterality Date  . Knee surgery      Both  . Cholecystectomy    . Appendectomy    . Hernia repair    . Tonsillectomy    .  Sphincterotomy      for sphincter of Oddi dysfunction   Current Outpatient Prescriptions  Medication Sig Dispense Refill  . ciprofloxacin (CIPRO) 500 MG tablet Take 1 tablet (500 mg total) by mouth 2 (two) times daily.  20 tablet  0  . ibuprofen (ADVIL,MOTRIN) 800 MG tablet Take 800 mg by mouth every 8 (eight) hours as needed for moderate pain.      Marland Kitchen lovastatin (MEVACOR) 40 MG tablet Take 1 tablet (40 mg total) by mouth at bedtime.  90 tablet  3  . omeprazole (PRILOSEC) 40 MG capsule Take 1 capsule (40 mg total) by mouth daily.  90 capsule  3  . ondansetron (ZOFRAN) 4 MG tablet Take 1 tablet (4 mg total) by mouth every 8 (eight) hours as needed for nausea or vomiting.  20 tablet  0  . oxyCODONE-acetaminophen (PERCOCET) 7.5-325 MG per tablet Take 1-2 tablets by mouth every 4 (four) hours as needed.  50 tablet  0  . tadalafil (CIALIS) 20 MG tablet Take 20 mg by mouth daily as needed for erectile dysfunction.       No current facility-administered medications for this visit.   No Known Allergies History  Substance Use Topics  . Smoking status: Never Smoker   . Smokeless tobacco: Never Used  . Alcohol Use: 0.6 oz/week    1 Glasses of wine per week     Comment: occ  Review of Systems General: No fever chills and now minimal fatigue Respiratory no shortness of breath cough wheezing Cardiac: No chest pain palpitations or history heart disease Abdomen: As above GU: He is having some suprapubic pain with urination Neurologic: No numbness weakness or history of stroke    Objective:   Physical Exam BP 121/81  Pulse 76  Temp(Src) 97.4 F (36.3 C) (Temporal)  Resp 18  Ht 6\' 1"  (1.854 m)  Wt 193 lb 9.6 oz (87.816 kg)  BMI 25.55 kg/m2 General: Alert, well-developedCaucasian male, in no distress Skin: Warm and dry without rash or infection. HEENT: No palpable masses or thyromegaly. Sclera nonicteric. Pupils equal round and reactive. Oropharynx clear. Lymph nodes: No cervical,  supraclavicular, or inguinal nodes palpable. Lungs: Breath sounds clear and equal without increased work of breathing Cardiovascular: Regular rate and rhythm without murmur. No JVD or edema. Peripheral pulses intact. Abdomen: Nondistended. Soft and nontender. Drainage catheter present in the low midline without evidence of exit site infection. There is a fairly thick purulent drainage and the drainage bag but not obviously feculent. Well-healed right lower quadrant incision. No masses palpable. No organomegaly. No palpable hernias. Extremities: No edema or joint swelling or deformity. No chronic venous stasis changes. Neurologic: Alert and fully oriented. Gait normal.    Assessment:     Recent episode of perforated sigmoid diverticulitis with large abscess and some degree of free perforation with free air as well. He has responded well to percutaneous drainage and antibiotics. Currently almost asymptomatic and physical exam unremarkable. He still has significant drainage from his catheter and may well have a fistula to his colon. We discussed that surgical treatment would be necessary should he have a fistula and that colectomy can certainly be considered 2 avoid future problems due to the severe problems he has had today and his young age. He is leaning strongly toward this. I told him I would like to wait 6-8 weeks after his perforation to plan elective surgery. We discussed the surgery in general terms and he was given literature regarding diverticulitis and colectomy. He'll remain off antibiotics now and call should he have any increasing pain or fever. We will repeat his CT scan next week for drain injection. I will see him back in 2 weeks and we will discuss further surgical options.     Plan:     Repeat CT with drain injection next week to return to the office in 2 weeks. Likely to proceed with laparoscopic-assisted sigmoid colectomy.

## 2013-10-23 NOTE — Patient Instructions (Signed)
Diverticulitis °A diverticulum is a small pouch or sac on the colon. Diverticulosis is the presence of these diverticula on the colon. Diverticulitis is the irritation (inflammation) or infection of diverticula. °CAUSES  °The colon and its diverticula contain bacteria. If food particles block the tiny opening to a diverticulum, the bacteria inside can grow and cause an increase in pressure. This leads to infection and inflammation and is called diverticulitis. °SYMPTOMS  °· Abdominal pain and tenderness. Usually, the pain is located on the left side of your abdomen. However, it could be located elsewhere. °· Fever. °· Bloating. °· Feeling sick to your stomach (nausea). °· Throwing up (vomiting). °· Abnormal stools. °DIAGNOSIS  °Your caregiver will take a history and perform a physical exam. Since many things can cause abdominal pain, other tests may be necessary. Tests may include: °· Blood tests. °· Urine tests. °· X-ray of the abdomen. °· CT scan of the abdomen. °Sometimes, surgery is needed to determine if diverticulitis or other conditions are causing your symptoms. °TREATMENT  °Most of the time, you can be treated without surgery. Treatment includes: °· Resting the bowels by only having liquids for a few days. As you improve, you will need to eat a low-fiber diet. °· Intravenous (IV) fluids if you are losing body fluids (dehydrated). °· Antibiotic medicines that treat infections may be given. °· Pain and nausea medicine, if needed. °· Surgery if the inflamed diverticulum has burst. °HOME CARE INSTRUCTIONS  °· Try a clear liquid diet (broth, tea, or water for as long as directed by your caregiver). You may then gradually begin a low-fiber diet as tolerated.  °A low-fiber diet is a diet with less than 10 grams of fiber. Choose the foods below to reduce fiber in the diet: °· White breads, cereals, rice, and pasta. °· Cooked fruits and vegetables or soft fresh fruits and vegetables without the skin. °· Ground or  well-cooked tender beef, ham, veal, lamb, pork, or poultry. °· Eggs and seafood. °· After your diverticulitis symptoms have improved, your caregiver may put you on a high-fiber diet. A high-fiber diet includes 14 grams of fiber for every 1000 calories consumed. For a standard 2000 calorie diet, you would need 28 grams of fiber. Follow these diet guidelines to help you increase the fiber in your diet. It is important to slowly increase the amount fiber in your diet to avoid gas, constipation, and bloating. °· Choose whole-grain breads, cereals, pasta, and brown rice. °· Choose fresh fruits and vegetables with the skin on. Do not overcook vegetables because the more vegetables are cooked, the more fiber is lost. °· Choose more nuts, seeds, legumes, dried peas, beans, and lentils. °· Look for food products that have greater than 3 grams of fiber per serving on the Nutrition Facts label. °· Take all medicine as directed by your caregiver. °· If your caregiver has given you a follow-up appointment, it is very important that you go. Not going could result in lasting (chronic) or permanent injury, pain, and disability. If there is any problem keeping the appointment, call to reschedule. °SEEK MEDICAL CARE IF:  °· Your pain does not improve. °· You have a hard time advancing your diet beyond clear liquids. °· Your bowel movements do not return to normal. °SEEK IMMEDIATE MEDICAL CARE IF:  °· Your pain becomes worse. °· You have an oral temperature above 102° F (38.9° C), not controlled by medicine. °· You have repeated vomiting. °· You have bloody or black, tarry stools. °·   Symptoms that brought you to your caregiver become worse or are not getting better. °MAKE SURE YOU:  °· Understand these instructions. °· Will watch your condition. °· Will get help right away if you are not doing well or get worse. °Document Released: 07/12/2005 Document Revised: 12/25/2011 Document Reviewed: 11/07/2010 °[employer]® Patient Information  ©2014 [employer]. ° °

## 2013-10-24 ENCOUNTER — Telehealth (INDEPENDENT_AMBULATORY_CARE_PROVIDER_SITE_OTHER): Payer: Self-pay

## 2013-10-24 NOTE — Telephone Encounter (Signed)
Pt called stating when he flushed his drain today the saline came squirting out at drain site. Let Dr Excell Seltzer know. He states he wouldn't do anything about it right now. He has an appointment with radiology on Monday and they will look at it then. Pt made aware.

## 2013-10-27 ENCOUNTER — Encounter (HOSPITAL_COMMUNITY): Payer: Self-pay

## 2013-10-27 ENCOUNTER — Ambulatory Visit (HOSPITAL_COMMUNITY)
Admission: RE | Admit: 2013-10-27 | Discharge: 2013-10-27 | Disposition: A | Discharge status: Home / Self Care | Payer: BC Managed Care – PPO | Source: Ambulatory Visit | Attending: General Surgery | Admitting: General Surgery

## 2013-10-27 DIAGNOSIS — K5720 Diverticulitis of large intestine with perforation and abscess without bleeding: Secondary | ICD-10-CM

## 2013-10-27 DIAGNOSIS — K630 Abscess of intestine: Secondary | ICD-10-CM | POA: Insufficient documentation

## 2013-10-27 DIAGNOSIS — K632 Fistula of intestine: Secondary | ICD-10-CM | POA: Insufficient documentation

## 2013-10-27 MED ORDER — IOHEXOL 300 MG/ML  SOLN
100.0000 mL | Freq: Once | INTRAMUSCULAR | Status: AC | PRN
  Administered 2013-10-27: 100 mL via INTRAVENOUS

## 2013-10-30 ENCOUNTER — Encounter (INDEPENDENT_AMBULATORY_CARE_PROVIDER_SITE_OTHER): Payer: Self-pay | Admitting: General Surgery

## 2013-10-31 ENCOUNTER — Telehealth (INDEPENDENT_AMBULATORY_CARE_PROVIDER_SITE_OTHER): Payer: Self-pay

## 2013-10-31 ENCOUNTER — Encounter (INDEPENDENT_AMBULATORY_CARE_PROVIDER_SITE_OTHER): Payer: BC Managed Care – PPO | Admitting: General Surgery

## 2013-10-31 ENCOUNTER — Telehealth (INDEPENDENT_AMBULATORY_CARE_PROVIDER_SITE_OTHER): Payer: Self-pay | Admitting: General Surgery

## 2013-10-31 NOTE — Telephone Encounter (Signed)
Tried to call patient with CT results but no answer at either number

## 2013-10-31 NOTE — Telephone Encounter (Signed)
Please call patient [phone] concerning his CT results

## 2013-11-04 NOTE — Progress Notes (Signed)
Dr. Excell Seltzer - Please enter preop orders in Epic for [facility].  Thanks.

## 2013-11-05 ENCOUNTER — Encounter (HOSPITAL_COMMUNITY): Payer: Self-pay | Admitting: Pharmacy Technician

## 2013-11-07 ENCOUNTER — Encounter (INDEPENDENT_AMBULATORY_CARE_PROVIDER_SITE_OTHER): Payer: Self-pay | Admitting: General Surgery

## 2013-11-07 ENCOUNTER — Ambulatory Visit (INDEPENDENT_AMBULATORY_CARE_PROVIDER_SITE_OTHER): Payer: BC Managed Care – PPO | Admitting: General Surgery

## 2013-11-07 VITALS — BP 127/90 | HR 77 | Temp 98.1°F | Resp 18 | Ht 73.0 in | Wt 199.4 lb

## 2013-11-07 DIAGNOSIS — K5720 Diverticulitis of large intestine with perforation and abscess without bleeding: Secondary | ICD-10-CM

## 2013-11-07 DIAGNOSIS — K5732 Diverticulitis of large intestine without perforation or abscess without bleeding: Secondary | ICD-10-CM

## 2013-11-07 MED ORDER — OXYCODONE-ACETAMINOPHEN 7.5-325 MG PO TABS: 1.0000 | ORAL_TABLET | ORAL | Status: DC | PRN

## 2013-11-07 NOTE — Progress Notes (Signed)
Dr Excell Seltzer-  NEED PRE OP ORDERS PLEASE--- HAS PST APPT 11/10/13  Thanks

## 2013-11-07 NOTE — Progress Notes (Signed)
Chief complaint: Followup diverticular abscess.  History: The patient returns for routine followup with a history of a large diverticular abscess status post percutaneous drainage one month ago on 10/09/2013. 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 He has been feeling generally well with just some pain around the drain site. No significant deep abdominal pain. He has had purulent and somewhat feculent drainage from the drain. Bowels are moving regularly although he does take MiraLAX most days. No fever or nausea or vomiting or malaise.  Past Medical History  Diagnosis Date  . Hyperlipidemia   . Hypertension   . Reflux   . ED (erectile dysfunction)   . Prostatitis   . Pancreatitis, acute   . Diverticulosis    Past Surgical History  Procedure Laterality Date  . Knee surgery      Both  . Cholecystectomy    . Appendectomy    . Hernia repair    . Tonsillectomy    . Sphincterotomy      for sphincter of Oddi dysfunction   Current Outpatient Prescriptions  Medication Sig Dispense Refill  . ibuprofen (ADVIL,MOTRIN) 800 MG tablet Take 800 mg by mouth every 8 (eight) hours as needed for moderate pain.      . Lactobacillus Rhamnosus, GG, (CULTURELLE PO) Take 1 tablet by mouth daily.      Marland Kitchen lovastatin (MEVACOR) 40 MG tablet Take 1 tablet (40 mg total) by mouth at bedtime.  90 tablet  3  . Multiple Vitamin (MULTIVITAMIN WITH MINERALS) TABS tablet Take 2 tablets by mouth daily.      Marland Kitchen omeprazole (PRILOSEC) 40 MG capsule Take 1 capsule (40 mg total) by mouth daily.  90 capsule  3  . oxyCODONE-acetaminophen (PERCOCET) 7.5-325 MG per tablet Take 1-2 tablets by mouth every 4 (four) hours as needed.  50 tablet  0  . tadalafil (CIALIS) 20 MG tablet Take 20 mg by mouth daily as needed for erectile dysfunction.       No current facility-administered medications for this  visit.   No Known Allergies  Exam: BP 127/90  Pulse 77  Temp(Src) 98.1 F (36.7 C) (Temporal)  Resp 18  Ht 6\' 1"  (1.854 m)  Wt 199 lb 6.4 oz (90.447 kg)  BMI 26.31 kg/m2 General: Healthy-appearing Caucasian male in no distress Skin: The right for infection Lungs: Clear equal breath sounds without increased work of breathing Cardiac: Regular rate and rhythm without murmurs. No edema Abdomen: Percutaneous drain and lower midline. Purulent drainage in the bag. Abdomen is generally flat and soft and nontender. No palpable masses. No distention.  Assessment and plan: Status post percutaneous drainage of large diverticular abscess. 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 As per previous plan we will proceed with laparoscopic-assisted and possible open colectomy in approximately 2 weeks. We discussed the procedure again in detail today. We discussed very likely needing to convert to open due to due the expected significant inflammation but will approach initially laparoscopically. We discussed that we could decide to do a diverting ileostomy or even a Hartmann colectomy based on operative findings. We discussed expected recovery and possible complications and anesthetic risks, bleeding, infection. All his questions were answered. He is given a mechanical and antibiotic bowel prep to use the day before surgery.  I urged to call for any concerns. 

## 2013-11-10 ENCOUNTER — Other Ambulatory Visit (INDEPENDENT_AMBULATORY_CARE_PROVIDER_SITE_OTHER): Payer: Self-pay | Admitting: General Surgery

## 2013-11-10 ENCOUNTER — Encounter (HOSPITAL_COMMUNITY): Payer: Self-pay

## 2013-11-10 ENCOUNTER — Encounter (HOSPITAL_COMMUNITY)
Admission: RE | Admit: 2013-11-10 | Discharge: 2013-11-10 | Disposition: A | Discharge status: Home / Self Care | Payer: BC Managed Care – PPO | Source: Ambulatory Visit | Attending: General Surgery | Admitting: General Surgery

## 2013-11-10 DIAGNOSIS — Z01818 Encounter for other preprocedural examination: Secondary | ICD-10-CM | POA: Insufficient documentation

## 2013-11-10 DIAGNOSIS — Z01812 Encounter for preprocedural laboratory examination: Secondary | ICD-10-CM | POA: Insufficient documentation

## 2013-11-10 HISTORY — DX: Nausea with vomiting, unspecified: R11.2

## 2013-11-10 HISTORY — DX: Sleep disorder, unspecified: G47.9

## 2013-11-10 HISTORY — DX: Gastro-esophageal reflux disease without esophagitis: K21.9

## 2013-11-10 HISTORY — DX: Other specified postprocedural states: Z98.890

## 2013-11-10 HISTORY — DX: Diverticulitis of intestine, part unspecified, with perforation and abscess without bleeding: K57.80

## 2013-11-10 HISTORY — DX: Dysuria: R30.0

## 2013-11-10 LAB — CBC WITH DIFFERENTIAL/PLATELET
BASOS ABS: 0 10*3/uL (ref 0.0–0.1)
Basophils Relative: 0 % (ref 0–1)
Eosinophils Absolute: 0.7 10*3/uL (ref 0.0–0.7)
Eosinophils Relative: 14 % — ABNORMAL HIGH (ref 0–5)
HEMATOCRIT: 40.3 % (ref 39.0–52.0)
Hemoglobin: 13.7 g/dL (ref 13.0–17.0)
Lymphocytes Relative: 34 % (ref 12–46)
Lymphs Abs: 1.8 10*3/uL (ref 0.7–4.0)
MCH: 28.7 pg (ref 26.0–34.0)
MCHC: 34 g/dL (ref 30.0–36.0)
MCV: 84.5 fL (ref 78.0–100.0)
Monocytes Absolute: 0.4 10*3/uL (ref 0.1–1.0)
Monocytes Relative: 7 % (ref 3–12)
NEUTROS ABS: 2.4 10*3/uL (ref 1.7–7.7)
NEUTROS PCT: 44 % (ref 43–77)
Platelets: 219 10*3/uL (ref 150–400)
RBC: 4.77 MIL/uL (ref 4.22–5.81)
RDW: 14.2 % (ref 11.5–15.5)
WBC: 5.4 10*3/uL (ref 4.0–10.5)

## 2013-11-10 LAB — BASIC METABOLIC PANEL
BUN: 19 mg/dL (ref 6–23)
CO2: 27 mEq/L (ref 19–32)
CREATININE: 0.84 mg/dL (ref 0.50–1.35)
Calcium: 9.5 mg/dL (ref 8.4–10.5)
Chloride: 100 mEq/L (ref 96–112)
Glucose, Bld: 95 mg/dL (ref 70–99)
Potassium: 4.5 mEq/L (ref 3.7–5.3)
Sodium: 140 mEq/L (ref 137–147)

## 2013-11-10 LAB — ABO/RH: ABO/RH(D): O POS

## 2013-11-10 NOTE — Patient Instructions (Addendum)
William [name]  11/10/2013                           YOUR PROCEDURE IS SCHEDULED ON: 11/17/13               PLEASE REPORT TO SHORT STAY CENTER AT : 7:00 AM               CALL THIS NUMBER IF ANY PROBLEMS THE DAY OF SURGERY :               [phone]                      REMEMBER:   Do not eat food or drink liquids AFTER MIDNIGHT   Take these medicines the morning of surgery with A SIP OF WATER:OMEPRAZOLE   Do not wear jewelry, make-up   Do not wear lotions, powders, or perfumes.   Do not shave legs or underarms 12 hrs. before surgery (men may shave face)  Do not bring valuables to the hospital.  Contacts, dentures or bridgework may not be worn into surgery.  Leave suitcase in the car. After surgery it may be brought to your room.  For patients admitted to the hospital more than one night, checkout time is 11:00                          The day of discharge.   Patients discharged the day of surgery will not be allowed to drive home                             If going home same day of surgery, must have someone stay with you first                           24 hrs at home and arrange for some one to drive you home from hospital.    Special Instructions:   Please read over the following fact sheets that you were given:               1. FOLLOW BOWEL PREP                      2. [place]               3. STOP ASPIRIN AND HERBAL MEDS 7 DAYS PREOP                                                X_____________________________________________________________________        Failure to follow these instructions may result in cancellation of your surgery

## 2013-11-17 ENCOUNTER — Encounter (HOSPITAL_COMMUNITY): Payer: BC Managed Care – PPO | Admitting: Anesthesiology

## 2013-11-17 ENCOUNTER — Encounter (HOSPITAL_COMMUNITY)
Admission: RE | Disposition: A | Discharge status: Home / Self Care | Payer: Self-pay | Source: Ambulatory Visit | Attending: General Surgery

## 2013-11-17 ENCOUNTER — Inpatient Hospital Stay (HOSPITAL_COMMUNITY): Payer: BC Managed Care – PPO | Admitting: Anesthesiology

## 2013-11-17 ENCOUNTER — Inpatient Hospital Stay (HOSPITAL_COMMUNITY)
Admission: RE | Admit: 2013-11-17 | Discharge: 2013-11-21 | DRG: 330 | Disposition: A | Discharge status: Home / Self Care | Payer: BC Managed Care – PPO | Source: Ambulatory Visit | Attending: General Surgery | Admitting: General Surgery

## 2013-11-17 ENCOUNTER — Encounter (HOSPITAL_COMMUNITY): Payer: Self-pay | Admitting: *Deleted

## 2013-11-17 DIAGNOSIS — K5732 Diverticulitis of large intestine without perforation or abscess without bleeding: Secondary | ICD-10-CM

## 2013-11-17 DIAGNOSIS — R142 Eructation: Secondary | ICD-10-CM | POA: Diagnosis not present

## 2013-11-17 DIAGNOSIS — Z87891 Personal history of nicotine dependence: Secondary | ICD-10-CM

## 2013-11-17 DIAGNOSIS — R300 Dysuria: Secondary | ICD-10-CM | POA: Diagnosis not present

## 2013-11-17 DIAGNOSIS — Y838 Other surgical procedures as the cause of abnormal reaction of the patient, or of later complication, without mention of misadventure at the time of the procedure: Secondary | ICD-10-CM | POA: Diagnosis not present

## 2013-11-17 DIAGNOSIS — G8918 Other acute postprocedural pain: Secondary | ICD-10-CM | POA: Diagnosis not present

## 2013-11-17 DIAGNOSIS — L02818 Cutaneous abscess of other sites: Secondary | ICD-10-CM | POA: Diagnosis not present

## 2013-11-17 DIAGNOSIS — R143 Flatulence: Secondary | ICD-10-CM

## 2013-11-17 DIAGNOSIS — R141 Gas pain: Secondary | ICD-10-CM | POA: Diagnosis not present

## 2013-11-17 DIAGNOSIS — K630 Abscess of intestine: Secondary | ICD-10-CM | POA: Diagnosis present

## 2013-11-17 DIAGNOSIS — K5730 Diverticulosis of large intestine without perforation or abscess without bleeding: Secondary | ICD-10-CM

## 2013-11-17 DIAGNOSIS — Z01812 Encounter for preprocedural laboratory examination: Secondary | ICD-10-CM

## 2013-11-17 DIAGNOSIS — K219 Gastro-esophageal reflux disease without esophagitis: Secondary | ICD-10-CM | POA: Diagnosis present

## 2013-11-17 DIAGNOSIS — R110 Nausea: Secondary | ICD-10-CM | POA: Diagnosis not present

## 2013-11-17 DIAGNOSIS — L03818 Cellulitis of other sites: Secondary | ICD-10-CM

## 2013-11-17 HISTORY — PX: LAPAROSCOPIC SIGMOID COLECTOMY: SHX5928

## 2013-11-17 LAB — TYPE AND SCREEN
ABO/RH(D): O POS
ANTIBODY SCREEN: NEGATIVE

## 2013-11-17 SURGERY — COLECTOMY, SIGMOID, LAPAROSCOPIC
Anesthesia: General

## 2013-11-17 MED ORDER — SUCCINYLCHOLINE CHLORIDE 20 MG/ML IJ SOLN
INTRAMUSCULAR | Status: DC | PRN
  Administered 2013-11-17: 100 mg via INTRAVENOUS

## 2013-11-17 MED ORDER — FENTANYL CITRATE 0.05 MG/ML IJ SOLN
INTRAMUSCULAR | Status: DC | PRN
  Administered 2013-11-17 (×2): 50 ug via INTRAVENOUS
  Administered 2013-11-17: 100 ug via INTRAVENOUS
  Administered 2013-11-17: 50 ug via INTRAVENOUS

## 2013-11-17 MED ORDER — BUPIVACAINE HCL (PF) 0.5 % IJ SOLN
INTRAMUSCULAR | Status: DC | PRN
  Administered 2013-11-17: 30 mL

## 2013-11-17 MED ORDER — PROPOFOL 10 MG/ML IV BOLUS
INTRAVENOUS | Status: AC
  Filled 2013-11-17: qty 20

## 2013-11-17 MED ORDER — PROPOFOL 10 MG/ML IV BOLUS
INTRAVENOUS | Status: DC | PRN
  Administered 2013-11-17: 200 mg via INTRAVENOUS

## 2013-11-17 MED ORDER — PROMETHAZINE HCL 25 MG/ML IJ SOLN: 6.2500 mg | INTRAMUSCULAR | Status: DC | PRN

## 2013-11-17 MED ORDER — OXYCODONE-ACETAMINOPHEN 5-325 MG PO TABS
1.0000 | ORAL_TABLET | ORAL | Status: DC | PRN
  Administered 2013-11-18 – 2013-11-21 (×11): 2 via ORAL
  Filled 2013-11-17 (×12): qty 2

## 2013-11-17 MED ORDER — HYDROMORPHONE HCL PF 1 MG/ML IJ SOLN: 0.2500 mg | INTRAMUSCULAR | Status: DC | PRN

## 2013-11-17 MED ORDER — LACTATED RINGERS IR SOLN
Status: DC | PRN
  Administered 2013-11-17: 1

## 2013-11-17 MED ORDER — MIDAZOLAM HCL 5 MG/5ML IJ SOLN
INTRAMUSCULAR | Status: DC | PRN
  Administered 2013-11-17 (×2): 2 mg via INTRAVENOUS

## 2013-11-17 MED ORDER — ROCURONIUM BROMIDE 100 MG/10ML IV SOLN
INTRAVENOUS | Status: DC | PRN
  Administered 2013-11-17 (×2): 10 mg via INTRAVENOUS
  Administered 2013-11-17: 50 mg via INTRAVENOUS
  Administered 2013-11-17: 10 mg via INTRAVENOUS

## 2013-11-17 MED ORDER — PHENYLEPHRINE 40 MCG/ML (10ML) SYRINGE FOR IV PUSH (FOR BLOOD PRESSURE SUPPORT)
PREFILLED_SYRINGE | INTRAVENOUS | Status: AC
  Filled 2013-11-17: qty 10

## 2013-11-17 MED ORDER — DEXAMETHASONE SODIUM PHOSPHATE 10 MG/ML IJ SOLN
INTRAMUSCULAR | Status: DC | PRN
  Administered 2013-11-17: 10 mg via INTRAVENOUS

## 2013-11-17 MED ORDER — EPHEDRINE SULFATE 50 MG/ML IJ SOLN
INTRAMUSCULAR | Status: DC | PRN
  Administered 2013-11-17 (×2): 10 mg via INTRAVENOUS

## 2013-11-17 MED ORDER — ONDANSETRON HCL 4 MG/2ML IJ SOLN
INTRAMUSCULAR | Status: DC | PRN
  Administered 2013-11-17: 4 mg via INTRAVENOUS

## 2013-11-17 MED ORDER — HEPARIN SODIUM (PORCINE) 5000 UNIT/ML IJ SOLN
5000.0000 [IU] | Freq: Three times a day (TID) | INTRAMUSCULAR | Status: DC
  Administered 2013-11-17 – 2013-11-21 (×12): 5000 [IU] via SUBCUTANEOUS
  Filled 2013-11-17 (×14): qty 1

## 2013-11-17 MED ORDER — CEFOTETAN DISODIUM-DEXTROSE 2-2.08 GM-% IV SOLR
INTRAVENOUS | Status: AC
  Filled 2013-11-17: qty 50

## 2013-11-17 MED ORDER — PHENYLEPHRINE HCL 10 MG/ML IJ SOLN
INTRAMUSCULAR | Status: DC | PRN
Start: 2013-11-17 — End: 2013-11-17
  Administered 2013-11-17: 40 ug via INTRAVENOUS

## 2013-11-17 MED ORDER — ACETAMINOPHEN 10 MG/ML IV SOLN
1000.0000 mg | Freq: Once | INTRAVENOUS | Status: AC
  Administered 2013-11-17: 1000 mg via INTRAVENOUS
  Filled 2013-11-17: qty 100

## 2013-11-17 MED ORDER — METOCLOPRAMIDE HCL 5 MG/ML IJ SOLN
INTRAMUSCULAR | Status: DC | PRN
  Administered 2013-11-17: 10 mg via INTRAVENOUS

## 2013-11-17 MED ORDER — ONDANSETRON HCL 4 MG PO TABS
4.0000 mg | ORAL_TABLET | Freq: Four times a day (QID) | ORAL | Status: DC | PRN
Start: 2013-11-17 — End: 2013-11-21

## 2013-11-17 MED ORDER — ONDANSETRON HCL 4 MG/2ML IJ SOLN
4.0000 mg | Freq: Four times a day (QID) | INTRAMUSCULAR | Status: DC | PRN
Start: 2013-11-17 — End: 2013-11-21
  Administered 2013-11-19 – 2013-11-20 (×3): 4 mg via INTRAVENOUS
  Filled 2013-11-17 (×3): qty 2

## 2013-11-17 MED ORDER — LACTATED RINGERS IV SOLN
INTRAVENOUS | Status: DC
  Administered 2013-11-17: 11:00:00 via INTRAVENOUS
  Administered 2013-11-17: 1000 mL via INTRAVENOUS
  Administered 2013-11-17 (×2): via INTRAVENOUS

## 2013-11-17 MED ORDER — 0.9 % SODIUM CHLORIDE (POUR BTL) OPTIME
TOPICAL | Status: DC | PRN
  Administered 2013-11-17: 2000 mL

## 2013-11-17 MED ORDER — MIDAZOLAM HCL 2 MG/2ML IJ SOLN
INTRAMUSCULAR | Status: AC
  Filled 2013-11-17: qty 2

## 2013-11-17 MED ORDER — KCL IN DEXTROSE-NACL 20-5-0.9 MEQ/L-%-% IV SOLN
INTRAVENOUS | Status: DC
  Administered 2013-11-17 – 2013-11-20 (×8): via INTRAVENOUS
  Filled 2013-11-17 (×9): qty 1000

## 2013-11-17 MED ORDER — DEXTROSE 5 % IV SOLN
2.0000 g | INTRAVENOUS | Status: AC
  Administered 2013-11-17: 2 g via INTRAVENOUS
  Filled 2013-11-17: qty 2

## 2013-11-17 MED ORDER — GLYCOPYRROLATE 0.2 MG/ML IJ SOLN
INTRAMUSCULAR | Status: DC | PRN
  Administered 2013-11-17: 0.6 mg via INTRAVENOUS

## 2013-11-17 MED ORDER — NEOSTIGMINE METHYLSULFATE 1 MG/ML IJ SOLN
INTRAMUSCULAR | Status: DC | PRN
  Administered 2013-11-17: 4 mg via INTRAVENOUS

## 2013-11-17 MED ORDER — HYDROMORPHONE HCL PF 1 MG/ML IJ SOLN
0.2500 mg | INTRAMUSCULAR | Status: DC | PRN
  Administered 2013-11-17 (×6): 0.5 mg via INTRAVENOUS

## 2013-11-17 MED ORDER — LACTATED RINGERS IV SOLN
INTRAVENOUS | Status: DC
Start: 2013-11-17 — End: 2013-11-17

## 2013-11-17 MED ORDER — HYDROMORPHONE HCL PF 1 MG/ML IJ SOLN
1.0000 mg | INTRAMUSCULAR | Status: DC | PRN
  Administered 2013-11-17 – 2013-11-18 (×5): 2 mg via INTRAVENOUS
  Administered 2013-11-18 (×2): 1 mg via INTRAVENOUS
  Administered 2013-11-18 – 2013-11-19 (×4): 2 mg via INTRAVENOUS
  Administered 2013-11-19: 1 mg via INTRAVENOUS
  Administered 2013-11-19: 2 mg via INTRAVENOUS
  Administered 2013-11-19: 1 mg via INTRAVENOUS
  Administered 2013-11-19 (×3): 2 mg via INTRAVENOUS
  Administered 2013-11-19: 1 mg via INTRAVENOUS
  Administered 2013-11-20 (×3): 2 mg via INTRAVENOUS
  Administered 2013-11-20 – 2013-11-21 (×5): 1 mg via INTRAVENOUS
  Administered 2013-11-21 (×2): 2 mg via INTRAVENOUS
  Filled 2013-11-17 (×3): qty 1
  Filled 2013-11-17 (×2): qty 2
  Filled 2013-11-17 (×2): qty 1
  Filled 2013-11-17: qty 2
  Filled 2013-11-17: qty 1
  Filled 2013-11-17 (×4): qty 2
  Filled 2013-11-17 (×2): qty 1
  Filled 2013-11-17: qty 2
  Filled 2013-11-17 (×2): qty 1
  Filled 2013-11-17 (×5): qty 2
  Filled 2013-11-17: qty 1
  Filled 2013-11-17 (×4): qty 2

## 2013-11-17 MED ORDER — LIDOCAINE HCL (CARDIAC) 20 MG/ML IV SOLN
INTRAVENOUS | Status: DC | PRN
  Administered 2013-11-17: 60 mg via INTRAVENOUS

## 2013-11-17 MED ORDER — HYDROMORPHONE HCL PF 1 MG/ML IJ SOLN
INTRAMUSCULAR | Status: AC
  Filled 2013-11-17: qty 1

## 2013-11-17 MED ORDER — FENTANYL CITRATE 0.05 MG/ML IJ SOLN
INTRAMUSCULAR | Status: AC
  Filled 2013-11-17: qty 5

## 2013-11-17 MED ORDER — ROCURONIUM BROMIDE 100 MG/10ML IV SOLN
INTRAVENOUS | Status: AC
  Filled 2013-11-17: qty 1

## 2013-11-17 MED ORDER — PANTOPRAZOLE SODIUM 40 MG PO TBEC
40.0000 mg | DELAYED_RELEASE_TABLET | Freq: Every day | ORAL | Status: DC
  Administered 2013-11-18 – 2013-11-21 (×4): 40 mg via ORAL
  Filled 2013-11-17 (×6): qty 1

## 2013-11-17 MED ORDER — MORPHINE SULFATE 2 MG/ML IJ SOLN
2.0000 mg | INTRAMUSCULAR | Status: DC | PRN
  Administered 2013-11-17: 4 mg via INTRAVENOUS
  Administered 2013-11-17: 6 mg via INTRAVENOUS
  Filled 2013-11-17: qty 3
  Filled 2013-11-17: qty 2

## 2013-11-17 MED ORDER — SCOPOLAMINE 1 MG/3DAYS TD PT72
MEDICATED_PATCH | TRANSDERMAL | Status: AC
  Filled 2013-11-17: qty 1

## 2013-11-17 MED ORDER — BUPIVACAINE HCL (PF) 0.5 % IJ SOLN
INTRAMUSCULAR | Status: AC
  Filled 2013-11-17: qty 30

## 2013-11-17 MED ORDER — SCOPOLAMINE 1 MG/3DAYS TD PT72
MEDICATED_PATCH | TRANSDERMAL | Status: DC | PRN
  Administered 2013-11-17: 1 via TRANSDERMAL

## 2013-11-17 MED ORDER — EPHEDRINE SULFATE 50 MG/ML IJ SOLN
INTRAMUSCULAR | Status: AC
  Filled 2013-11-17: qty 1

## 2013-11-17 MED ORDER — LIDOCAINE HCL (CARDIAC) 20 MG/ML IV SOLN
INTRAVENOUS | Status: AC
  Filled 2013-11-17: qty 5

## 2013-11-17 SURGICAL SUPPLY — 79 items
ADH SKN CLS APL DERMABOND .7 (GAUZE/BANDAGES/DRESSINGS) ×1
APPLIER CLIP 5 13 M/L LIGAMAX5 (MISCELLANEOUS)
APPLIER CLIP ROT 10 11.4 M/L (STAPLE)
BLADE HEX COATED 2.75 (ELECTRODE) ×2 IMPLANT
BLADE SURG SZ10 CARB STEEL (BLADE) ×2 IMPLANT
CELLS DAT CNTRL 66122 CELL SVR (MISCELLANEOUS) ×1 IMPLANT
CHLORAPREP W/TINT 26ML (MISCELLANEOUS) ×2 IMPLANT
CLIP APPLIE 5 13 M/L LIGAMAX5 (MISCELLANEOUS) IMPLANT
CLIP APPLIE ROT 10 11.4 M/L (STAPLE) IMPLANT
COVER MAYO STAND STRL (DRAPES) ×4 IMPLANT
COVER SURGICAL LIGHT HANDLE (MISCELLANEOUS) ×2 IMPLANT
CUTTER FLEX LINEAR 45M (STAPLE) ×2 IMPLANT
DERMABOND ADVANCED (GAUZE/BANDAGES/DRESSINGS) ×1
DERMABOND ADVANCED .7 DNX12 (GAUZE/BANDAGES/DRESSINGS) ×1 IMPLANT
DRAPE CAMERA CLOSED 9X96 (DRAPES) ×2 IMPLANT
DRAPE LAPAROSCOPIC ABDOMINAL (DRAPES) ×2 IMPLANT
DRAPE LG THREE QUARTER DISP (DRAPES) ×2 IMPLANT
DRAPE WARM FLUID 44X44 (DRAPE) ×2 IMPLANT
ELECT REM PT RETURN 9FT ADLT (ELECTROSURGICAL) ×2
ELECTRODE REM PT RTRN 9FT ADLT (ELECTROSURGICAL) ×1 IMPLANT
ENDOLOOP SUT PDS II  0 18 (SUTURE) ×1
ENDOLOOP SUT PDS II 0 18 (SUTURE) ×1 IMPLANT
GLOVE BIO SURGEON STRL SZ 6.5 (GLOVE) ×4 IMPLANT
GLOVE BIO SURGEON STRL SZ7.5 (GLOVE) ×2 IMPLANT
GLOVE BIOGEL PI IND STRL 7.0 (GLOVE) ×4 IMPLANT
GLOVE BIOGEL PI INDICATOR 7.0 (GLOVE) ×4
GLOVE SURG SIGNA 7.5 PF LTX (GLOVE) ×4 IMPLANT
GLOVE SURG SS PI 7.5 STRL IVOR (GLOVE) ×4 IMPLANT
GOWN STRL REUS W/TWL LRG LVL3 (GOWN DISPOSABLE) ×4 IMPLANT
GOWN STRL REUS W/TWL XL LVL3 (GOWN DISPOSABLE) ×12 IMPLANT
KIT BASIN OR (CUSTOM PROCEDURE TRAY) ×4 IMPLANT
LEGGING LITHOTOMY PAIR STRL (DRAPES) ×2 IMPLANT
LIGASURE IMPACT 36 18CM CVD LR (INSTRUMENTS) IMPLANT
MARKER SKIN DUAL TIP RULER LAB (MISCELLANEOUS) ×2 IMPLANT
NS IRRIG 1000ML POUR BTL (IV SOLUTION) ×4 IMPLANT
PENCIL BUTTON HOLSTER BLD 10FT (ELECTRODE) ×4 IMPLANT
RELOAD STAPLE TA45 3.5 REG BLU (ENDOMECHANICALS) ×4 IMPLANT
RTRCTR WOUND ALEXIS 18CM MED (MISCELLANEOUS) ×2
SCALPEL HARMONIC ACE (MISCELLANEOUS) ×2 IMPLANT
SCISSORS LAP 5X35 DISP (ENDOMECHANICALS) ×2 IMPLANT
SEALER TISSUE G2 CVD JAW 35 (ENDOMECHANICALS) IMPLANT
SEALER TISSUE G2 CVD JAW 45CM (ENDOMECHANICALS)
SET IRRIG TUBING LAPAROSCOPIC (IRRIGATION / IRRIGATOR) ×4 IMPLANT
SLEEVE ADV FIXATION 12X100MM (TROCAR) IMPLANT
SLEEVE ADV FIXATION 5X100MM (TROCAR) IMPLANT
SOLUTION ANTI FOG 6CC (MISCELLANEOUS) ×2 IMPLANT
SPONGE GAUZE 4X4 12PLY (GAUZE/BANDAGES/DRESSINGS) IMPLANT
SPONGE LAP 18X18 X RAY DECT (DISPOSABLE) ×4 IMPLANT
STAPLER CIRC CVD 29MM 37CM (STAPLE) ×2 IMPLANT
STAPLER PROXIMATE 75MM BLUE (STAPLE) ×2 IMPLANT
STAPLER VISISTAT 35W (STAPLE) ×2 IMPLANT
SUCTION POOLE TIP (SUCTIONS) ×2 IMPLANT
SUT MNCRL AB 4-0 PS2 18 (SUTURE) IMPLANT
SUT PDS AB 1 CT1 27 (SUTURE) IMPLANT
SUT PDS AB 1 CTX 36 (SUTURE) IMPLANT
SUT PDS AB 1 TP1 96 (SUTURE) IMPLANT
SUT PROLENE 2 0 KS (SUTURE) ×2 IMPLANT
SUT SILK 2 0 (SUTURE) ×1
SUT SILK 2 0 SH CR/8 (SUTURE) ×2 IMPLANT
SUT SILK 2-0 18XBRD TIE 12 (SUTURE) ×1 IMPLANT
SUT SILK 3 0 (SUTURE) ×1
SUT SILK 3 0 SH CR/8 (SUTURE) ×2 IMPLANT
SUT SILK 3-0 18XBRD TIE 12 (SUTURE) ×1 IMPLANT
SYR BULB IRRIGATION 50ML (SYRINGE) ×2 IMPLANT
SYS LAPSCP GELPORT 120MM (MISCELLANEOUS)
SYSTEM LAPSCP GELPORT 120MM (MISCELLANEOUS) IMPLANT
TOWEL OR 17X26 10 PK STRL BLUE (TOWEL DISPOSABLE) ×4 IMPLANT
TRAY FOLEY CATH 16FRSI W/METER (SET/KITS/TRAYS/PACK) ×2 IMPLANT
TRAY LAP CHOLE (CUSTOM PROCEDURE TRAY) ×2 IMPLANT
TROCAR ADV FIXATION 11X100MM (TROCAR) IMPLANT
TROCAR ADV FIXATION 12X100MM (TROCAR) IMPLANT
TROCAR ADV FIXATION 5X100MM (TROCAR) IMPLANT
TROCAR BLADELESS OPT 5 75 (ENDOMECHANICALS) ×4 IMPLANT
TROCAR XCEL 12X100 BLDLESS (ENDOMECHANICALS) ×2 IMPLANT
TROCAR XCEL BLUNT TIP 100MML (ENDOMECHANICALS) ×2 IMPLANT
TROCAR XCEL NON-BLD 11X100MML (ENDOMECHANICALS) IMPLANT
TUBING INSUFFLATION 10FT LAP (TUBING) ×2 IMPLANT
YANKAUER SUCT BULB TIP 10FT TU (MISCELLANEOUS) ×4 IMPLANT
YANKAUER SUCT BULB TIP NO VENT (SUCTIONS) ×2 IMPLANT

## 2013-11-17 NOTE — Anesthesia Postprocedure Evaluation (Signed)
Anesthesia Post Note  Patient: William [name]  Procedure(s) Performed: Procedure(s) (LRB): LAPAROSCOPIC SIGMOID COLECTOMY rigid proctoscopy (N/A)  Anesthesia type: General  Patient location: PACU  Post pain: Pain level controlled  Post assessment: Post-op Vital signs reviewed  Last Vitals:  Filed Vitals:   11/17/13 1457  BP: 146/95  Pulse: 81  Temp: 36.8 C  Resp: 16    Post vital signs: Reviewed  Level of consciousness: sedated  Complications: No apparent anesthesia complications

## 2013-11-17 NOTE — Interval H&P Note (Signed)
History and Physical Interval Note:  11/17/2013 9:22 AM  William [name]  has presented today for surgery, with the diagnosis of perforated diverticulitis   The various methods of treatment have been discussed with the patient and family. After consideration of risks, benefits and other options for treatment, the patient has consented to  Procedure(s): LAPAROSCOPIC SIGMOID COLECTOMY (N/A) as a surgical intervention .  The patient's history has been reviewed, patient examined, no change in status, stable for surgery.  I have reviewed the patient's chart and labs.  Questions were answered to the patient's satisfaction.     [name] T

## 2013-11-17 NOTE — Transfer of Care (Signed)
Immediate Anesthesia Transfer of Care Note  Patient: William [name]  Procedure(s) Performed: Procedure(s): LAPAROSCOPIC SIGMOID COLECTOMY rigid proctoscopy (N/A)  Patient Location: PACU  Anesthesia Type:General  Level of Consciousness: awake and alert   Airway & Oxygen Therapy: Patient Spontanous Breathing and Patient connected to face mask oxygen  Post-op Assessment: Report given to PACU RN and Post -op Vital signs reviewed and stable  Post vital signs: Reviewed and stable  Complications: No apparent anesthesia complications

## 2013-11-17 NOTE — H&P (View-Only) (Signed)
Chief complaint: Followup diverticular abscess.  History: The patient returns for routine followup with a history of a large diverticular abscess status post percutaneous drainage one month ago on 10/09/2013. 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 He has been feeling generally well with just some pain around the drain site. No significant deep abdominal pain. He has had purulent and somewhat feculent drainage from the drain. Bowels are moving regularly although he does take MiraLAX most days. No fever or nausea or vomiting or malaise.  Past Medical History  Diagnosis Date  . Hyperlipidemia   . Hypertension   . Reflux   . ED (erectile dysfunction)   . Prostatitis   . Pancreatitis, acute   . Diverticulosis    Past Surgical History  Procedure Laterality Date  . Knee surgery      Both  . Cholecystectomy    . Appendectomy    . Hernia repair    . Tonsillectomy    . Sphincterotomy      for sphincter of Oddi dysfunction   Current Outpatient Prescriptions  Medication Sig Dispense Refill  . ibuprofen (ADVIL,MOTRIN) 800 MG tablet Take 800 mg by mouth every 8 (eight) hours as needed for moderate pain.      . Lactobacillus Rhamnosus, GG, (CULTURELLE PO) Take 1 tablet by mouth daily.      Marland Kitchen lovastatin (MEVACOR) 40 MG tablet Take 1 tablet (40 mg total) by mouth at bedtime.  90 tablet  3  . Multiple Vitamin (MULTIVITAMIN WITH MINERALS) TABS tablet Take 2 tablets by mouth daily.      Marland Kitchen omeprazole (PRILOSEC) 40 MG capsule Take 1 capsule (40 mg total) by mouth daily.  90 capsule  3  . oxyCODONE-acetaminophen (PERCOCET) 7.5-325 MG per tablet Take 1-2 tablets by mouth every 4 (four) hours as needed.  50 tablet  0  . tadalafil (CIALIS) 20 MG tablet Take 20 mg by mouth daily as needed for erectile dysfunction.       No current facility-administered medications for this  visit.   No Known Allergies  Exam: BP 127/90  Pulse 77  Temp(Src) 98.1 F (36.7 C) (Temporal)  Resp 18  Ht 6\' 1"  (1.854 m)  Wt 199 lb 6.4 oz (90.447 kg)  BMI 26.31 kg/m2 General: Healthy-appearing Caucasian male in no distress Skin: The right for infection Lungs: Clear equal breath sounds without increased work of breathing Cardiac: Regular rate and rhythm without murmurs. No edema Abdomen: Percutaneous drain and lower midline. Purulent drainage in the bag. Abdomen is generally flat and soft and nontender. No palpable masses. No distention.  Assessment and plan: Status post percutaneous drainage of large diverticular abscess. 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 As per previous plan we will proceed with laparoscopic-assisted and possible open colectomy in approximately 2 weeks. We discussed the procedure again in detail today. We discussed very likely needing to convert to open due to due the expected significant inflammation but will approach initially laparoscopically. We discussed that we could decide to do a diverting ileostomy or even a Hartmann colectomy based on operative findings. We discussed expected recovery and possible complications and anesthetic risks, bleeding, infection. All his questions were answered. He is given a mechanical and antibiotic bowel prep to use the day before surgery.  I urged to call for any concerns.

## 2013-11-17 NOTE — Op Note (Signed)
Preoperative Diagnosis: perforated diverticulitis   Postoprative Diagnosis: perforated diverticulitis   Procedure: Procedure(s): LAPAROSCOPIC SIGMOID COLECTOMY rigid proctoscopy   Surgeon: Excell Seltzer T   Assistants: Alphonsa Overall  Anesthesia:  General endotracheal anesthesia  Indications: patient is a 48 year old male who approximately 6 weeks ago presented with perforated sigmoid diverticulitis with a large pericolonic abscess. He underwent percutaneous drainage and was treated with IV and then oral antibiotics. He has had persistent slightly feculent drainage from his drain and followup CT scan shows resolution of the abscess but likely fistula to the drain. We recommended proceeding with laparoscopic and possible open sigmoid colectomy. The procedure and recovery and risks have been discussed in detailed extensively elsewhere. He is now brought the operating room for this procedure.  Procedure Detail:  Patient was brought to the operating room, placed in the supine position on the operating table, and general endotracheal anesthesia induced. He received preoperative IV antibiotics. He received a mechanical and antibiotic bowel prep at home. He was carefully positioned in a semi-lithotomy position. Foley catheter was placed. PAS replaced. The abdomen and perineum were widely sterilely prepped and draped. Access was obtained with an open Hassan technique for a 1 cm incision just beneath the umbilicus through mattress suture of 0 Vicryl and pneumoperitoneum established. Under direct vision a 5 mm trocar was placed just above and medial to the right and left iliac crest. There were some omental adhesions to the right lower quadrant from remote open appendectomy were taken down easily with the harmonic scalpel. A second 5 mm trocar was placed in the right lateral mid abdomen. The percutaneous drain had been removed at the beginning of the procedure and there was a area of adhesion and pericolonic  fat that was stuck up to the midline at the drain site. This was taken down with the Harmonic Scalpel and we came across the fistula. There was no real leakage. There was significant inflammatory change in the mid sigmoid colon. There were omental adhesions down into the pelvis and around the colon but these were able to be swept away easily in the omentum displaced in the upper abdomen. There were some dense inflammatory adhesions of the sigmoid to the bladder peritoneum and lateral pelvic sidewall the left that were carefully taken down with the harmonic scalpel and then were able to bring the inflamed segment of sigmoid up out of the pelvis and see that there was paramount distal soft normal-appearing rectosigmoid. The sigmoid was further mobilized laterally with careful blunt dissection and we were able to identify the ureter throughout its course and it was carefully protected throughout the remainder of the dissection. The left colon was mobilized dividing lateral peritoneal attachments up toward the splenic flexure and then back down to the sigmoid completely mobilizing the left and sigmoid colon.  I then chose a area of distal rectosigmoid where there were no tinea and the bowel was soft and healthy appearing for the distal resection. The 5 mm trocar at the right anterior suprailiac spine was changed for a 12 mm trocar. The mesentery of the rectosigmoid at this site was carefully bluntly dissected away from the bowel and a window created behind the rectosigmoid. The bowel was then divided with 2 firings of the blue load 45 mm stapler. Following this the mesentery of the rectosigmoid and sigmoid and distal left colon was sequentially divided with the harmonic scalpel staying up out of the retroperitoneum and keeping the left ureter in view. As dissection was carried proximally to  normal soft distal left colon. At this point a small about 4-5 cm incision was made in the left lower quadrant obliquely through  the previous 5 mm trocar sites in a muscle-splitting incision carried down into the peritoneum. The end of the bowel was brought out through this and we brought up the entire sigmoid and left colon easily with nice soft normal bowel reaching down to the pubis. This was chosen as a site of proximal resection and was cleared of mesentery which was clamped and divided with 2-0 silk. The bowel was divided this point with the pursestring clamp after placing a 2-0 Prolene pursestring suture. The pursestring was intact and the end of the bowel was sized a 29 mm and a 29 mm EEA stapler was chosen the anvil placed in the distal bowel the pursestring secured. There was very good perfusion. This was then dropped back into the abdomen and the incision sealed with the Power wound protector that had been placed. Dr. Lucia Gaskins then went below and was able to pass the 29 mm stapler easily up to the rectosigmoid stump and it was deployed under direct vision just posterior to the staple line. The anvil was attached and the stapler closed and fired and removed without difficulty. Both doughnuts were thick and intact. There was absolutely no tension on the anastomosis. Dr. Lucia Gaskins and performed rigid sigmoidoscopy and with the anastomosis tensely distended with air there was no evidence of leak. The abdomen was thoroughly irrigated and complete hemostasis assured. The wound protector was removed and all instruments and gloves and drapes were changed. The mattress sutures secured the umbilicus. The left lower quadrant incision was closed in 2 layers with running 0 PDS. Skin was closed with Vicryl and Dermabond. Sponge needle and instrument counts were correct.    Findings: Consistent with diverticulitis and previous perforation of the sigmoid colon  Estimated Blood Loss:  less than 100 mL         Drains: none  Blood Given: none          Specimens: sigmoid colon        Complications:  * No complications entered in OR log *          Disposition: PACU - hemodynamically stable.         Condition: stable

## 2013-11-17 NOTE — Anesthesia Preprocedure Evaluation (Addendum)
Anesthesia Evaluation  Patient identified by MRN, date of birth, ID band Patient awake    Reviewed: Allergy & Precautions, H&P , NPO status , Patient's Chart, lab work & pertinent test results  History of Anesthesia Complications (+) PONV  Airway Mallampati: II TM Distance: >3 FB Neck ROM: Full    Dental  (+) Teeth Intact and Dental Advisory Given   Pulmonary neg pulmonary ROS, former smoker,  breath sounds clear to auscultation  Pulmonary exam normal       Cardiovascular negative cardio ROS  Rhythm:Regular Rate:Normal     Neuro/Psych negative neurological ROS  negative psych ROS   GI/Hepatic Neg liver ROS, GERD-  Medicated,Perforated diverticulitis.   Endo/Other  negative endocrine ROS  Renal/GU negative Renal ROS  negative genitourinary   Musculoskeletal negative musculoskeletal ROS (+)   Abdominal   Peds  Hematology negative hematology ROS (+)   Anesthesia Other Findings   Reproductive/Obstetrics                          Anesthesia Physical Anesthesia Plan  ASA: II  Anesthesia Plan: General   Post-op Pain Management:    Induction: Intravenous, Rapid sequence and Cricoid pressure planned  Airway Management Planned: Oral ETT  Additional Equipment:   Intra-op Plan:   Post-operative Plan: Extubation in OR  Informed Consent: I have reviewed the patients History and Physical, chart, labs and discussed the procedure including the risks, benefits and alternatives for the proposed anesthesia with the patient or authorized representative who has indicated his/her understanding and acceptance.   Dental advisory given  Plan Discussed with: CRNA  Anesthesia Plan Comments:         Anesthesia Quick Evaluation

## 2013-11-18 ENCOUNTER — Encounter (HOSPITAL_COMMUNITY): Payer: Self-pay | Admitting: General Surgery

## 2013-11-18 LAB — BASIC METABOLIC PANEL
BUN: 9 mg/dL (ref 6–23)
CALCIUM: 9.1 mg/dL (ref 8.4–10.5)
CO2: 25 mEq/L (ref 19–32)
CREATININE: 0.68 mg/dL (ref 0.50–1.35)
Chloride: 100 mEq/L (ref 96–112)
GFR calc non Af Amer: >90 mL/min (ref >90)
Glucose, Bld: 134 mg/dL — ABNORMAL HIGH (ref 70–99)
Potassium: 4.2 mEq/L (ref 3.7–5.3)
Sodium: 140 mEq/L (ref 137–147)

## 2013-11-18 LAB — CBC
HEMATOCRIT: 37.9 % — AB (ref 39.0–52.0)
Hemoglobin: 13.2 g/dL (ref 13.0–17.0)
MCH: 28.9 pg (ref 26.0–34.0)
MCHC: 34.8 g/dL (ref 30.0–36.0)
MCV: 82.9 fL (ref 78.0–100.0)
PLATELETS: 229 10*3/uL (ref 150–400)
RBC: 4.57 MIL/uL (ref 4.22–5.81)
RDW: 14.2 % (ref 11.5–15.5)
WBC: 10.9 10*3/uL — AB (ref 4.0–10.5)

## 2013-11-18 MED ORDER — DIPHENHYDRAMINE HCL 25 MG PO CAPS
25.0000 mg | ORAL_CAPSULE | Freq: Four times a day (QID) | ORAL | Status: DC | PRN
  Administered 2013-11-18: 25 mg via ORAL
  Administered 2013-11-18: 50 mg via ORAL
  Filled 2013-11-18: qty 2
  Filled 2013-11-18: qty 1

## 2013-11-18 NOTE — Care Management Note (Signed)
    Page 1 of 1   11/18/2013     10:46:57 AM   CARE MANAGEMENT NOTE 11/18/2013  Patient:  William [name], William [name]   Account Number:  [id number]  Date Initiated:  11/18/2013  Documentation initiated by:  Sunday Spillers  Subjective/Objective Assessment:   48 yo male admitted s/p lap colectomy. PTA lived at home with mother.     Action/Plan:   Home when stable   Anticipated DC Date:  11/21/2013   Anticipated DC Plan:  [place]  CM consult      Choice offered to / List presented to:             Status of service:  Completed, signed off Medicare Important Message given?  NA - LOS <3 / Initial given by admissions (If response is "NO", the following Medicare IM given date fields will be blank) Date Medicare IM given:   Date Additional Medicare IM given:    Discharge Disposition:  HOME/SELF CARE  Per UR Regulation:  Reviewed for med. necessity/level of care/duration of stay  If discussed at [place] of Stay Meetings, dates discussed:    Comments:

## 2013-11-18 NOTE — Progress Notes (Signed)
Patient ID: William [name], male   DOB: 1966/08/03, 48 y.o.   MRN: [id number] 1 Day Post-Op  Subjective: No C/O.  Very good pain control, just sore.  No nausea. Has voided after Foley out with some dysuria  Objective: Vital signs in last 24 hours: Temp:  [97 F (36.1 C)-98.2 F (36.8 C)] 97.5 F (36.4 C) (02/03 0554) Pulse Rate:  [77-93] 82 (02/03 0554) Resp:  [16-18] 18 (02/03 0554) BP: (120-147)/(80-95) 120/80 mmHg (02/03 0554) SpO2:  [94 %-100 %] 94 % (02/03 0554) Weight:  [201 lb 1 oz (91.2 kg)] 201 lb 1 oz (91.2 kg) (02/02 1513) Last BM Date:  (per pt, he has not had a BM since surgery)  Intake/Output from previous day: 02/02 0701 - 02/03 0700 In: 4456.3 [I.V.:4456.3] Out: 2625 [Urine:2575; Blood:50] Intake/Output this shift:    General appearance: alert, cooperative and no distress GI: normal findings: soft, non-tender and minimal tendernesss around LLQ incision Incision/Wound: Clean and dry without erythema  Lab Results:   Recent Labs  11/18/13 0520  WBC 10.9*  HGB 13.2  HCT 37.9*  PLT 229   BMET  Recent Labs  11/18/13 0520  NA 140  K 4.2  CL 100  CO2 25  GLUCOSE 134*  BUN 9  CREATININE 0.68  CALCIUM 9.1     Studies/Results: No results found.  Anti-infectives: Anti-infectives   Start     Dose/Rate Route Frequency Ordered Stop   11/17/13 0716  cefoTEtan (CEFOTAN) 2 g in dextrose 5 % 50 mL IVPB     2 g 100 mL/hr over 30 Minutes Intravenous On call to O.R. 11/17/13 8657 11/17/13 0946      Assessment/Plan: s/p Procedure(s): LAPAROSCOPIC SIGMOID COLECTOMY rigid proctoscopy Doing very well postop without apparent complication Start CL diet, increase activity   LOS: 1 day    Elverda Wendel T 11/18/2013

## 2013-11-19 LAB — CBC
HCT: 37 % — ABNORMAL LOW (ref 39.0–52.0)
HEMOGLOBIN: 12.4 g/dL — AB (ref 13.0–17.0)
MCH: 28.7 pg (ref 26.0–34.0)
MCHC: 33.5 g/dL (ref 30.0–36.0)
MCV: 85.6 fL (ref 78.0–100.0)
PLATELETS: 200 10*3/uL (ref 150–400)
RBC: 4.32 MIL/uL (ref 4.22–5.81)
RDW: 14.6 % (ref 11.5–15.5)
WBC: 8.3 10*3/uL (ref 4.0–10.5)

## 2013-11-19 MED ORDER — HYDROMORPHONE HCL PF 2 MG/ML IJ SOLN
INTRAMUSCULAR | Status: AC
  Administered 2013-11-19: 2 mg
  Filled 2013-11-19: qty 1

## 2013-11-19 MED ORDER — HYDROMORPHONE HCL PF 2 MG/ML IJ SOLN
INTRAMUSCULAR | Status: AC
  Filled 2013-11-19: qty 1

## 2013-11-19 NOTE — Progress Notes (Signed)
Patient ID: William [name], male   DOB: Mar 14, 1966, 48 y.o.   MRN: [id number] 2 Days Post-Op  Subjective: Some increased pain yesterday p.m. Both left side and some diffuse crampy pain. Has had 4 small bowel movements. Tolerating clear liquids without nausea. Feels much better again this morning.  Objective: Vital signs in last 24 hours: Temp:  [97.7 F (36.5 C)-98 F (36.7 C)] 97.7 F (36.5 C) (02/04 0540) Pulse Rate:  [61-67] 66 (02/04 0540) Resp:  [18] 18 (02/04 0540) BP: (138-157)/(88-100) 157/94 mmHg (02/04 0540) SpO2:  [95 %-99 %] 95 % (02/04 0540) Last BM Date: 11/18/13 (BM's x 4)  Intake/Output from previous day: 02/03 0701 - 02/04 0700 In: 2625 [P.O.:480; I.V.:2145] Out: 2050 [Urine:2050] Intake/Output this shift:    General appearance: alert, cooperative and no distress GI: mild to moderate left-sided left lower quadrant tenderness particularly around the incision. Bowel sounds present. Rest of abdomen soft and nontender. Incision/Wound: clean and dry without erythema  Lab Results:   Recent Labs  11/18/13 0520 11/19/13 0530  WBC 10.9* 8.3  HGB 13.2 12.4*  HCT 37.9* 37.0*  PLT 229 200   BMET  Recent Labs  11/18/13 0520  NA 140  K 4.2  CL 100  CO2 25  GLUCOSE 134*  BUN 9  CREATININE 0.68  CALCIUM 9.1     Studies/Results: No results found.  Anti-infectives: Anti-infectives   Start     Dose/Rate Route Frequency Ordered Stop   11/17/13 0716  cefoTEtan (CEFOTAN) 2 g in dextrose 5 % 50 mL IVPB     2 g 100 mL/hr over 30 Minutes Intravenous On call to O.R. 11/17/13 5053 11/17/13 0946      Assessment/Plan: s/p Procedure(s): LAPAROSCOPIC SIGMOID COLECTOMY rigid proctoscopy Doing well without apparent complication. Likely gas pain yesterday which is much improved today. Tenderness appropriate. Advanced to full liquid diet.   LOS: 2 days    Talecia Sherlin T 11/19/2013

## 2013-11-20 LAB — CBC WITH DIFFERENTIAL/PLATELET
BASOS ABS: 0 10*3/uL (ref 0.0–0.1)
Basophils Relative: 0 % (ref 0–1)
Eosinophils Absolute: 0.1 10*3/uL (ref 0.0–0.7)
Eosinophils Relative: 1 % (ref 0–5)
HCT: 40.8 % (ref 39.0–52.0)
Hemoglobin: 14.3 g/dL (ref 13.0–17.0)
LYMPHS ABS: 1.8 10*3/uL (ref 0.7–4.0)
LYMPHS PCT: 19 % (ref 12–46)
MCH: 29.6 pg (ref 26.0–34.0)
MCHC: 35 g/dL (ref 30.0–36.0)
MCV: 84.5 fL (ref 78.0–100.0)
Monocytes Absolute: 0.6 10*3/uL (ref 0.1–1.0)
Monocytes Relative: 7 % (ref 3–12)
NEUTROS ABS: 6.6 10*3/uL (ref 1.7–7.7)
Neutrophils Relative %: 73 % (ref 43–77)
PLATELETS: 237 10*3/uL (ref 150–400)
RBC: 4.83 MIL/uL (ref 4.22–5.81)
RDW: 14.2 % (ref 11.5–15.5)
WBC: 9.1 10*3/uL (ref 4.0–10.5)

## 2013-11-20 MED ORDER — PIPERACILLIN-TAZOBACTAM 3.375 G IVPB
3.3750 g | Freq: Three times a day (TID) | INTRAVENOUS | Status: DC
  Administered 2013-11-20 – 2013-11-21 (×4): 3.375 g via INTRAVENOUS
  Filled 2013-11-20 (×7): qty 50

## 2013-11-20 NOTE — Progress Notes (Signed)
Patient ID: William [name], male   DOB: Aug 06, 1966, 48 y.o.   MRN: [id number] 3 Days Post-Op  Subjective: Had increased abdominal pain yesterday evening which became fairly severe, described as intermittent and sharp and cramping in various areas throughout his abdomen. Also noticed some distention and nausea. Late last night he had a lot of flatus and 2 large bowel movements and felt he had marked relief. This morning has mild to moderate generalized abdominal discomfort much improved from last night. No further nausea. He has noted some swelling on the right side of his abdomen.  Objective: Vital signs in last 24 hours: Temp:  [97.3 F (36.3 C)-98.1 F (36.7 C)] 97.3 F (36.3 C) (02/05 0600) Pulse Rate:  [75-92] 89 (02/05 0600) Resp:  [16-18] 18 (02/05 0600) BP: (118-133)/(82-90) 118/82 mmHg (02/05 0600) SpO2:  [95 %-96 %] 96 % (02/05 0600) Last BM Date: 11/19/13 (3 very large one with lots of gas/ loosed)  Intake/Output from previous day: 02/04 0701 - 02/05 0700 In: 1672.1 [P.O.:360; I.V.:1312.1] Out: 500 [Urine:500] Intake/Output this shift:    General appearance: alert, cooperative and no distress GI: minimal distention. Bowel sounds are active. There is mild diffuse tenderness with no guarding. Incision/Wound: there is some noticeable puffiness and swelling in the right flank just above and lateral to the iliac crest. Also some faint erythema over this area which is lateral to the right lower quadrant trocar site. Wounds themselves looked fine.  Lab Results:   Recent Labs  11/18/13 0520 11/19/13 0530  WBC 10.9* 8.3  HGB 13.2 12.4*  HCT 37.9* 37.0*  PLT 229 200   BMET  Recent Labs  11/18/13 0520  NA 140  K 4.2  CL 100  CO2 25  GLUCOSE 134*  BUN 9  CREATININE 0.68  CALCIUM 9.1     Studies/Results: No results found.  Anti-infectives: Anti-infectives   Start     Dose/Rate Route Frequency Ordered Stop   11/20/13 0900  piperacillin-tazobactam (ZOSYN) IVPB  3.375 g     3.375 g 12.5 mL/hr over 240 Minutes Intravenous 3 times per day 11/20/13 0845     11/17/13 0716  cefoTEtan (CEFOTAN) 2 g in dextrose 5 % 50 mL IVPB     2 g 100 mL/hr over 30 Minutes Intravenous On call to O.R. 11/17/13 2119 11/17/13 0946      Assessment/Plan: s/p Procedure(s): LAPAROSCOPIC SIGMOID COLECTOMY rigid proctoscopy Increased pain and nausea last night which seemed consistent with ileus as he has had marked improvement after flatus and bowel movements. No fever. Her abdomen is mildly tender. He does however have some erythema and swelling in his right flank of uncertain source, question cellulitis lateral to his right lower quadrant trocar site He does not appear ill. I will check a CBC this morning. Start IV Zosyn for possible early cellulitis of his abdominal wall. Monitor closely.   LOS: 3 days    Evann Koelzer T 11/20/2013

## 2013-11-21 DIAGNOSIS — G8918 Other acute postprocedural pain: Secondary | ICD-10-CM | POA: Diagnosis present

## 2013-11-21 DIAGNOSIS — R1032 Left lower quadrant pain: Secondary | ICD-10-CM | POA: Diagnosis present

## 2013-11-21 DIAGNOSIS — E785 Hyperlipidemia, unspecified: Secondary | ICD-10-CM | POA: Diagnosis present

## 2013-11-21 DIAGNOSIS — Y838 Other surgical procedures as the cause of abnormal reaction of the patient, or of later complication, without mention of misadventure at the time of the procedure: Secondary | ICD-10-CM | POA: Diagnosis present

## 2013-11-21 DIAGNOSIS — K560 Paralytic ileus: Secondary | ICD-10-CM | POA: Diagnosis present

## 2013-11-21 DIAGNOSIS — Z87891 Personal history of nicotine dependence: Secondary | ICD-10-CM

## 2013-11-21 DIAGNOSIS — K219 Gastro-esophageal reflux disease without esophagitis: Secondary | ICD-10-CM | POA: Diagnosis present

## 2013-11-21 DIAGNOSIS — K929 Disease of digestive system, unspecified: Principal | ICD-10-CM | POA: Diagnosis present

## 2013-11-21 DIAGNOSIS — Z9049 Acquired absence of other specified parts of digestive tract: Secondary | ICD-10-CM

## 2013-11-21 LAB — CBC
HEMATOCRIT: 35.8 % — AB (ref 39.0–52.0)
HEMOGLOBIN: 12.1 g/dL — AB (ref 13.0–17.0)
MCH: 28.2 pg (ref 26.0–34.0)
MCHC: 33.8 g/dL (ref 30.0–36.0)
MCV: 83.4 fL (ref 78.0–100.0)
Platelets: 193 10*3/uL (ref 150–400)
RBC: 4.29 MIL/uL (ref 4.22–5.81)
RDW: 14.2 % (ref 11.5–15.5)
WBC: 7.8 10*3/uL (ref 4.0–10.5)

## 2013-11-21 MED ORDER — AMOXICILLIN-POT CLAVULANATE 875-125 MG PO TABS: 1.0000 | ORAL_TABLET | Freq: Two times a day (BID) | ORAL | Status: DC

## 2013-11-21 MED ORDER — HYDROMORPHONE HCL 4 MG PO TABS: 2.0000 mg | ORAL_TABLET | Freq: Four times a day (QID) | ORAL | Status: DC | PRN

## 2013-11-21 NOTE — Discharge Summary (Signed)
Patient ID: William [name] [id number] 47 y.o. 1965-11-28  11/17/2013  Discharge date and time: 11/21/2013   Admitting Physician: Excell Seltzer T  Discharge Physician: Excell Seltzer T  Admission Diagnoses: perforated diverticulitis   Discharge Diagnoses: same  Operations: Procedure(s): LAPAROSCOPIC SIGMOID COLECTOMY rigid proctoscopy  Admission Condition: fair  Discharged Condition: good  Indication for Admission: patient is a 48 year old male who approximately 6 weeks ago presented with a large peridiverticular abdominal abscess related to the sigmoid colon.  This was treated with percutaneous drainage and antibiotics.  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  After discussion of options we have elected to proceed with laparoscopic sigmoid colectomy.  Following mechanical and antibiotic bowel prep at home he is admitted for this procedure.  Hospital Course: on the morning of admission the patient underwent an uneventful laparoscopic sigmoid colectomy with stapled anastomosis.  His postoperative course was relatively smooth.  He had some expected incisional pain.  He had bowel movements begin within a couple of days the surgery was started on clear liquid diet.  For the last couple of days of his hospitalization he had escalating pain at night requiring IV Dilaudid and then would feel essentially fine in the morning.  2 days prior to discharge he did develop some swelling and redness of his right flank lateral to the right lower quadrant trocar site.  He had no fever and CBC on the day prior to her the day of discharge shows a normal white count. On the day of discharge the swelling is down significantly and there is a dull area of redness lateral to the incision and possibly consistent with ecchymosis from the trocar site or possibly resolving cellulitis.  He was covered with IV Zosyn for 2 days after the onset  of this.  However he is tolerating his diet well and has had several large loose bowel movements.  His abdomen is generally soft and nontender.  Incisions are all clean.  Pathology confirmed diverticulitis and abscess.  He is felt ready for discharge at this time and will be covered.  We with Augmentin for one week due to the possibility of some cellulitis lateral to his trocar site.   Disposition: Home  Patient Instructions:    Medication List         amoxicillin-clavulanate 875-125 MG per tablet  Commonly known as:  AUGMENTIN  Take 1 tablet by mouth 2 (two) times daily.     CULTURELLE PO  Take 1 tablet by mouth daily.     HYDROmorphone 4 MG tablet  Commonly known as:  DILAUDID  Take 0.5-2 tablets (2-8 mg total) by mouth every 6 (six) hours as needed.     ibuprofen 800 MG tablet  Commonly known as:  ADVIL,MOTRIN  Take 800 mg by mouth every 8 (eight) hours as needed for moderate pain.     lovastatin 40 MG tablet  Commonly known as:  MEVACOR  Take 1 tablet (40 mg total) by mouth at bedtime.     multivitamin with minerals Tabs tablet  Take 2 tablets by mouth daily.     omeprazole 40 MG capsule  Commonly known as:  PRILOSEC  Take 1 capsule (40 mg total) by mouth daily.     ondansetron 4 MG tablet  Commonly known as:  ZOFRAN  Take 4 mg by mouth every 8 (eight) hours as needed for nausea or vomiting.     oxyCODONE-acetaminophen 7.5-325 MG  per tablet  Commonly known as:  PERCOCET  Take 1-2 tablets by mouth every 4 (four) hours as needed.     tadalafil 20 MG tablet  Commonly known as:  CIALIS  Take 20 mg by mouth daily as needed for erectile dysfunction.        Activity: no heavy lifting for 4 weeks Diet: regular diet Wound Care: none needed  Follow-up:  With Dr. Excell Seltzer in 1 week.  Signed: Edward Jolly MD, FACS  11/21/2013, 10:47 AM

## 2013-11-21 NOTE — Discharge Instructions (Signed)
[facility]      [facility] [phone]  OPEN ABDOMINAL SURGERY: POST OP INSTRUCTIONS  Always review your discharge instruction sheet given to you by the facility where your surgery was performed.  IF YOU HAVE DISABILITY OR FAMILY LEAVE FORMS, YOU MUST BRING THEM TO THE OFFICE FOR PROCESSING.  PLEASE DO NOT GIVE THEM TO YOUR DOCTOR.  1. A prescription for pain medication may be given to you upon discharge.  Take your pain medication as prescribed, if needed.  If narcotic pain medicine is not needed, then you may take acetaminophen (Tylenol) or ibuprofen (Advil) as needed. 2. Take your usually prescribed medications unless otherwise directed. 3. If you need a refill on your pain medication, please contact your pharmacy. They will contact our office to request authorization.  Prescriptions will not be filled after 5pm or on week-ends. 4. You should follow a light diet the first few days after arrival home, such as soup and crackers, pudding, etc.unless your doctor has advised otherwise. A high-fiber, low fat diet can be resumed as tolerated.   Be sure to include lots of fluids daily. Most patients will experience some swelling and bruising on the chest and neck area.  Ice packs will help.  Swelling and bruising can take several days to resolve 5. Most patients will experience some swelling and bruising in the area of the incision. Ice pack will help. Swelling and bruising can take several days to resolve..  6. It is common to experience some constipation if taking pain medication after surgery.  Increasing fluid intake and taking a stool softener will usually help or prevent this problem from occurring.  A mild laxative (Milk of Magnesia or Miralax) should be taken according to package directions if there are no bowel movements after 48 hours. 7.  You may have steri-strips (small skin tapes) in place directly over the incision.  These strips should be left on the skin for 7-10 days.  If your  surgeon used skin glue on the incision, you may shower in 24 hours.  The glue will flake off over the next 2-3 weeks.  Any sutures or staples will be removed at the office during your follow-up visit. You may find that a light gauze bandage over your incision may keep your staples from being rubbed or pulled. You may shower and replace the bandage daily. 8. ACTIVITIES:  You may resume regular (light) daily activities beginning the next day--such as daily self-care, walking, climbing stairs--gradually increasing activities as tolerated.  You may have sexual intercourse when it is comfortable.  Refrain from any heavy lifting or straining until approved by your doctor. a. You may drive when you no longer are taking prescription pain medication, you can comfortably wear a seatbelt, and you can safely maneuver your car and apply brakes b. Return to Work: ___________________________________ 9. You should see your doctor in the office for a follow-up appointment approximately two weeks after your surgery.  Make sure that you call for this appointment within a day or two after you arrive home to insure a convenient appointment time. OTHER INSTRUCTIONS:  _____________________________________________________________ _____________________________________________________________  WHEN TO CALL YOUR DOCTOR: 1. Fever over 101.0 2. Inability to urinate 3. Nausea and/or vomiting 4. Extreme swelling or bruising 5. Continued bleeding from incision. 6. Increased pain, redness, or drainage from the incision. 7. Difficulty swallowing or breathing 8. Muscle cramping or spasms. 9. Numbness or tingling in hands or feet or around lips.  The clinic staff is available to   answer your questions during regular business hours.  Please don't hesitate to call and ask to speak to one of the nurses if you have concerns.  For further questions, please visit [email or web address]   

## 2013-11-21 NOTE — Progress Notes (Signed)
Patient ID: William [name], male   DOB: 29-Jul-1966, 48 y.o.   MRN: [id number] 4 Days Post-Op  Subjective: Again had some increased general abdominal pain yesterday p.m. But again feels much better today.  Minimal pain and up walking in the halls.  He has had several bowel movements.  Less swelling on the right side of his abdomen.  Objective: Vital signs in last 24 hours: Temp:  [97.1 F (36.2 C)-97.9 F (36.6 C)] 97.6 F (36.4 C) (02/06 0600) Pulse Rate:  [72-73] 72 (02/06 0600) Resp:  [16-18] 18 (02/06 0600) BP: (121-136)/(77-83) 136/77 mmHg (02/06 0600) SpO2:  [93 %-100 %] 100 % (02/06 0600) Last BM Date: 11/21/13  Intake/Output from previous day: 02/05 0701 - 02/06 0700 In: 1870 [P.O.:720; I.V.:1150] Out: 150 [Urine:150] Intake/Output this shift: Total I/O In: -  Out: 2 [Urine:1; Stool:1]  General appearance: alert, cooperative and no distress GI: minimal diffuse tenderness without guarding and some appropriate incisional tenderness Incision/Wound: incisions clean and dry.  There is a fairly large area of dull erythema around his right flank which is improved from yesterday and the swelling is essentially gone.  Lab Results:   Recent Labs  11/20/13 1000 11/21/13 0417  WBC 9.1 7.8  HGB 14.3 12.1*  HCT 40.8 35.8*  PLT 237 193   BMET No results found for this basename: NA, K, CL, CO2, GLUCOSE, BUN, CREATININE, CALCIUM,  in the last 72 hours   Studies/Results: No results found.  Anti-infectives: Anti-infectives   Start     Dose/Rate Route Frequency Ordered Stop   11/20/13 0900  piperacillin-tazobactam (ZOSYN) IVPB 3.375 g     3.375 g 12.5 mL/hr over 240 Minutes Intravenous 3 times per day 11/20/13 0845     11/17/13 0716  cefoTEtan (CEFOTAN) 2 g in dextrose 5 % 50 mL IVPB     2 g 100 mL/hr over 30 Minutes Intravenous On call to O.R. 11/17/13 0962 11/17/13 0946      Assessment/Plan: s/p Procedure(s): LAPAROSCOPIC SIGMOID COLECTOMY rigid proctoscopy Generally  doing well.  I did not see evidence of complication such as leak or abscess. Right flank erythema and swelling is better.  I am not sure if this represents some bleeding from his right lower quadrant trocar site or possibly some cellulitis.  He has no fever or elevated white count. Patient looks well today and I think is okay for discharge.  Send him home on one week of Augmentin empirically for possible cellulitis.   LOS: 4 days    Kelden Lavallee T 11/21/2013

## 2013-11-22 ENCOUNTER — Inpatient Hospital Stay (HOSPITAL_COMMUNITY)
Admission: EM | Admit: 2013-11-22 | Discharge: 2013-11-24 | DRG: 394 | Disposition: A | Discharge status: Home / Self Care | Payer: BC Managed Care – PPO | Attending: General Surgery | Admitting: General Surgery

## 2013-11-22 ENCOUNTER — Encounter (HOSPITAL_COMMUNITY): Payer: Self-pay | Admitting: Emergency Medicine

## 2013-11-22 ENCOUNTER — Emergency Department (HOSPITAL_COMMUNITY): Payer: BC Managed Care – PPO

## 2013-11-22 ENCOUNTER — Inpatient Hospital Stay (HOSPITAL_COMMUNITY): Payer: BC Managed Care – PPO

## 2013-11-22 DIAGNOSIS — G8918 Other acute postprocedural pain: Secondary | ICD-10-CM | POA: Diagnosis present

## 2013-11-22 DIAGNOSIS — R109 Unspecified abdominal pain: Secondary | ICD-10-CM

## 2013-11-22 LAB — COMPREHENSIVE METABOLIC PANEL
ALT: 83 U/L — ABNORMAL HIGH (ref 0–53)
AST: 45 U/L — ABNORMAL HIGH (ref 0–37)
Albumin: 4.1 g/dL (ref 3.5–5.2)
Alkaline Phosphatase: 79 U/L (ref 39–117)
BILIRUBIN TOTAL: 0.7 mg/dL (ref 0.3–1.2)
BUN: 9 mg/dL (ref 6–23)
CO2: 25 mEq/L (ref 19–32)
Calcium: 9.7 mg/dL (ref 8.4–10.5)
Chloride: 96 mEq/L (ref 96–112)
Creatinine, Ser: 0.68 mg/dL (ref 0.50–1.35)
GFR calc non Af Amer: >90 mL/min (ref >90)
Glucose, Bld: 110 mg/dL — ABNORMAL HIGH (ref 70–99)
POTASSIUM: 3.7 meq/L (ref 3.7–5.3)
Sodium: 136 mEq/L — ABNORMAL LOW (ref 137–147)
TOTAL PROTEIN: 7.9 g/dL (ref 6.0–8.3)

## 2013-11-22 LAB — CBC WITH DIFFERENTIAL/PLATELET
Basophils Absolute: 0 10*3/uL (ref 0.0–0.1)
Basophils Relative: 0 % (ref 0–1)
EOS ABS: 0.3 10*3/uL (ref 0.0–0.7)
Eosinophils Relative: 4 % (ref 0–5)
HEMATOCRIT: 39.8 % (ref 39.0–52.0)
HEMOGLOBIN: 14.4 g/dL (ref 13.0–17.0)
Lymphocytes Relative: 15 % (ref 12–46)
Lymphs Abs: 1.3 10*3/uL (ref 0.7–4.0)
MCH: 29.6 pg (ref 26.0–34.0)
MCHC: 36.2 g/dL — AB (ref 30.0–36.0)
MCV: 81.7 fL (ref 78.0–100.0)
MONO ABS: 0.5 10*3/uL (ref 0.1–1.0)
MONOS PCT: 6 % (ref 3–12)
Neutro Abs: 6.2 10*3/uL (ref 1.7–7.7)
Neutrophils Relative %: 75 % (ref 43–77)
Platelets: 251 10*3/uL (ref 150–400)
RBC: 4.87 MIL/uL (ref 4.22–5.81)
RDW: 13.9 % (ref 11.5–15.5)
WBC: 8.3 10*3/uL (ref 4.0–10.5)

## 2013-11-22 LAB — CG4 I-STAT (LACTIC ACID): Lactic Acid, Venous: 1.34 mmol/L (ref 0.5–2.2)

## 2013-11-22 MED ORDER — HYDROMORPHONE HCL PF 1 MG/ML IJ SOLN
1.0000 mg | Freq: Once | INTRAMUSCULAR | Status: AC
  Administered 2013-11-22: 1 mg via INTRAVENOUS

## 2013-11-22 MED ORDER — HEPARIN SODIUM (PORCINE) 5000 UNIT/ML IJ SOLN
5000.0000 [IU] | Freq: Three times a day (TID) | INTRAMUSCULAR | Status: DC
  Administered 2013-11-22 – 2013-11-24 (×7): 5000 [IU] via SUBCUTANEOUS
  Filled 2013-11-22 (×11): qty 1

## 2013-11-22 MED ORDER — IOHEXOL 300 MG/ML  SOLN
50.0000 mL | Freq: Once | INTRAMUSCULAR | Status: AC | PRN
  Administered 2013-11-22: 50 mL via ORAL

## 2013-11-22 MED ORDER — HYDROMORPHONE HCL PF 1 MG/ML IJ SOLN
1.0000 mg | INTRAMUSCULAR | Status: DC | PRN
  Administered 2013-11-22: 2 mg via INTRAVENOUS
  Administered 2013-11-22 (×2): 1 mg via INTRAVENOUS
  Administered 2013-11-22 (×4): 2 mg via INTRAVENOUS
  Administered 2013-11-22 (×2): 1 mg via INTRAVENOUS
  Administered 2013-11-23 (×2): 2 mg via INTRAVENOUS
  Filled 2013-11-22: qty 2
  Filled 2013-11-22 (×2): qty 1
  Filled 2013-11-22 (×3): qty 2
  Filled 2013-11-22: qty 1
  Filled 2013-11-22 (×4): qty 2
  Filled 2013-11-22: qty 1
  Filled 2013-11-22: qty 2

## 2013-11-22 MED ORDER — OXYCODONE-ACETAMINOPHEN 5-325 MG PO TABS
1.0000 | ORAL_TABLET | ORAL | Status: DC | PRN
  Administered 2013-11-23 – 2013-11-24 (×7): 2 via ORAL
  Filled 2013-11-22 (×7): qty 2

## 2013-11-22 MED ORDER — OXYCODONE-ACETAMINOPHEN 7.5-325 MG PO TABS: 1.0000 | ORAL_TABLET | ORAL | Status: DC | PRN

## 2013-11-22 MED ORDER — OXYCODONE HCL 5 MG PO TABS: 2.5000 mg | ORAL_TABLET | ORAL | Status: DC | PRN

## 2013-11-22 MED ORDER — ONDANSETRON HCL 4 MG/2ML IJ SOLN
4.0000 mg | Freq: Once | INTRAMUSCULAR | Status: AC
  Administered 2013-11-22: 4 mg via INTRAVENOUS
  Filled 2013-11-22: qty 2

## 2013-11-22 MED ORDER — IOHEXOL 300 MG/ML  SOLN
100.0000 mL | Freq: Once | INTRAMUSCULAR | Status: AC | PRN
  Administered 2013-11-22: 100 mL via INTRAVENOUS

## 2013-11-22 MED ORDER — KCL IN DEXTROSE-NACL 20-5-0.9 MEQ/L-%-% IV SOLN
INTRAVENOUS | Status: DC
  Administered 2013-11-22 – 2013-11-24 (×4): via INTRAVENOUS
  Filled 2013-11-22 (×6): qty 1000

## 2013-11-22 MED ORDER — HYDROMORPHONE HCL PF 1 MG/ML IJ SOLN
1.0000 mg | Freq: Once | INTRAMUSCULAR | Status: AC
  Administered 2013-11-22: 1 mg via INTRAVENOUS
  Filled 2013-11-22: qty 1

## 2013-11-22 MED ORDER — PANTOPRAZOLE SODIUM 40 MG IV SOLR
40.0000 mg | Freq: Every day | INTRAVENOUS | Status: DC
  Administered 2013-11-22 – 2013-11-23 (×2): 40 mg via INTRAVENOUS
  Filled 2013-11-22 (×3): qty 40

## 2013-11-22 MED ORDER — ONDANSETRON HCL 4 MG/2ML IJ SOLN
4.0000 mg | Freq: Four times a day (QID) | INTRAMUSCULAR | Status: DC | PRN
  Administered 2013-11-22 (×2): 4 mg via INTRAVENOUS
  Filled 2013-11-22 (×2): qty 2

## 2013-11-22 NOTE — ED Notes (Signed)
Came in with complaint of abdominal pain at 10/10 , pt. Claimed that he was just discharged yesterday at 3pm here for abdominal surgery that was done on 11/17/13. Pt. was managed by Dr. Excell Seltzer and paged for pt.'s arrival.

## 2013-11-22 NOTE — H&P (Signed)
Michelle Vanhise is an 48 y.o. male.   Chief Complaint: abdominal pain HPI: patient is a 48 year old male just discharged yesterday on postop day #4 following a laparoscopic sigmoid colectomy with a history of perforated diverticulitis percutaneous drain and fistula to the sigmoid colon. The patient was doing well at the time of discharge. He had complained of some increased abdominal pain in the evenings on the days prior to discharge but this was decreasing and he was discharged in good condition. However at about 9 PM last night he states he again had the onset of abdominal pain but much more severe. He describes sharp and cramping intermittent abdominal pain radiating down toward his lower abdomen and scrotal area. This became very severe. This was in various areas throughout his abdomen. He had been taking fluids eating a small amount of food during the day without difficulty. He's had no associated nausea or vomiting. He had several loose bowel movements today. No fever or chills. He called I asked him to come to the emergency room for evaluation.  Past Medical History  Diagnosis Date  . Hyperlipidemia   . Reflux   . ED (erectile dysfunction)   . Prostatitis   . Pancreatitis, acute   . Diverticulosis   . PONV (postoperative nausea and vomiting)   . Diverticulitis of intestine with perforation and abscess   . GERD (gastroesophageal reflux disease)   . Dysuria   . Difficulty sleeping     TAKES ADVIL PM    Past Surgical History  Procedure Laterality Date  . Knee surgery      Both  . Cholecystectomy    . Appendectomy    . Hernia repair    . Tonsillectomy    . Sphincterotomy      for sphincter of Oddi dysfunction  . Abcess drainage      ABDOMINAL  . Laparoscopic sigmoid colectomy N/A 11/17/2013    Procedure: LAPAROSCOPIC SIGMOID COLECTOMY rigid proctoscopy;  Surgeon: Edward Jolly, MD;  Location: [facility];  Service: General;  Laterality: N/A;    Family History  Problem Relation  Age of Onset  . Cancer Father     Colon  . Cancer Other   . Diverticulitis Other    Social History:  reports that he quit smoking about 27 years ago. He has never used smokeless tobacco. He reports that he drinks about 0.6 ounces of alcohol per week. He reports that he does not use illicit drugs.  Allergies: No Known Allergies   (Not in a hospital admission)  Results for orders placed during the hospital encounter of 11/22/13 (from the past 48 hour(s))  CBC WITH DIFFERENTIAL     Status: Abnormal   Collection Time    11/22/13 12:30 AM      Result Value Range   WBC 8.3  4.0 - 10.5 K/uL   RBC 4.87  4.22 - 5.81 MIL/uL   Hemoglobin 14.4  13.0 - 17.0 g/dL   HCT 39.8  39.0 - 52.0 %   MCV 81.7  78.0 - 100.0 fL   MCH 29.6  26.0 - 34.0 pg   MCHC 36.2 (*) 30.0 - 36.0 g/dL   RDW 13.9  11.5 - 15.5 %   Platelets 251  150 - 400 K/uL   Comment: DELTA CHECK NOTED     REPEATED TO VERIFY   Neutrophils Relative % 75  43 - 77 %   Neutro Abs 6.2  1.7 - 7.7 K/uL   Lymphocytes Relative 15  12 -  46 %   Lymphs Abs 1.3  0.7 - 4.0 K/uL   Monocytes Relative 6  3 - 12 %   Monocytes Absolute 0.5  0.1 - 1.0 K/uL   Eosinophils Relative 4  0 - 5 %   Eosinophils Absolute 0.3  0.0 - 0.7 K/uL   Basophils Relative 0  0 - 1 %   Basophils Absolute 0.0  0.0 - 0.1 K/uL  COMPREHENSIVE METABOLIC PANEL     Status: Abnormal   Collection Time    11/22/13 12:30 AM      Result Value Range   Sodium 136 (*) 137 - 147 mEq/L   Potassium 3.7  3.7 - 5.3 mEq/L   Chloride 96  96 - 112 mEq/L   CO2 25  19 - 32 mEq/L   Glucose, Bld 110 (*) 70 - 99 mg/dL   BUN 9  6 - 23 mg/dL   Creatinine, Ser 0.68  0.50 - 1.35 mg/dL   Calcium 9.7  8.4 - 10.5 mg/dL   Total Protein 7.9  6.0 - 8.3 g/dL   Albumin 4.1  3.5 - 5.2 g/dL   AST 45 (*) 0 - 37 U/L   ALT 83 (*) 0 - 53 U/L   Alkaline Phosphatase 79  39 - 117 U/L   Total Bilirubin 0.7  0.3 - 1.2 mg/dL   GFR calc non Af Amer >90  >90 mL/min   GFR calc Af Amer >90  >90 mL/min    Comment: (NOTE)     The eGFR has been calculated using the CKD EPI equation.     This calculation has not been validated in all clinical situations.     eGFR's persistently <90 mL/min signify possible Chronic Kidney     Disease.  CG4 I-STAT (LACTIC ACID)     Status: None   Collection Time    11/22/13 12:43 AM      Result Value Range   Lactic Acid, Venous 1.34  0.5 - 2.2 mmol/L   Dg Abd Portable 2v  11/22/2013   CLINICAL DATA:  48 year old male with abdominal pain. Sigmoid colectomy 5 days ago.  EXAM: PORTABLE ABDOMEN - 2 VIEW  COMPARISON:  10/27/2013 CT  FINDINGS: Gas within the colon is noted.  A few fluid and gas-filled loops of small bowel within the right abdomen are noted -nonspecific.  A tiny amount of pneumoperitoneum is identified, not unexpected postoperatively.  No acute bony abnormalities are present.  IMPRESSION: Nonspecific bowel gas pattern as described -most likely representing focal ileus. Early small bowel obstruction is difficult to entirely exclude. .  Tiny amount of pneumoperitoneum - not unexpected postoperatively.   Electronically Signed   By: Hassan Rowan M.D.   On: 11/22/2013 01:43    Review of Systems  Constitutional: Negative for fever and chills.  HENT: Negative.   Respiratory: Positive for shortness of breath. Negative for cough and wheezing.   Gastrointestinal: Positive for abdominal pain. Negative for nausea, vomiting, blood in stool and melena.  Genitourinary: Negative.     Blood pressure 138/86, pulse 89, temperature 97.7 F (36.5 C), temperature source Oral, resp. rate 24, SpO2 100.00%. Physical Exam  General:  Well-developed Caucasian male. Initially on presentation was in quite a bit of distress essentially writhing in pain and difficult to talk to or examined. After pain medication and reevaluation 45 minutes later he is calm and in no severe distress. Skin: Warm and dry without rash or infection HEENT: No masses. No icterus Lungs: Clear equal breath  sounds  without increased work of breathing Cardiac: Regular rate and rhythm. No edema. Abdomen: Well-healed laparoscopic and small left lower quadrant incisions. Minimal if any distention. Bowel sounds sounds are hyperactive. tthere is moderate diffuse tenderness without peritoneal signs. No evidence of wound infection. There is some faint ecchymosis around the right flank as noted prior to discharge. There is mild ecchymosis of the scrotum without swelling. Extremities: No edema or joint swelling Neurologic: Alert and oriented. Affect now normal.  Assessment/Plan Postoperative abdominal pain, 4 days post laparoscopic sigmoid colectomy. Lab work and plain x-rays and exam are not particularly revealing although his plain x-rays could indicate either ileus or partial small bowel obstruction. He does not have nausea or vomiting or obstipation the indicate obstruction. He does have hyperactive bowel sounds and this could be secondary to severe cramping from resolving ileus. However his pain has been quite severe. He currently is more comfortable. All plan admission for further evaluation and we are going to go ahead with a CT scan of the abdomen and pelvis.  Kloe Oates T 11/22/2013, 2:00 AM

## 2013-11-22 NOTE — ED Provider Notes (Signed)
MSE was initiated and I personally evaluated the patient and placed orders (if any) at  12:39 AM on February 37, 1331.  48 year old male status post lap.  Cholectomy.  He was discharged from the hospital yesterday.  Starting around 9 PM, he had sudden increase in pain.  He reports pain is worse in left lower quadrant and radiates down into his scrotum.  He has contacted his surgeon, who is in route to see him in the emergency department.  I have placed orders recommended by his surgeon.  Pain and nausea medicine, have been ordered.  He should is hemodynamically stable, plan to defer further evaluation until surgery has evaluated him.  Kalman Drape, MD 11/22/13 0040

## 2013-11-22 NOTE — Progress Notes (Signed)
Patient ID: William [name], male   DOB: April 24, 1966, 48 y.o.   MRN: [id number]    Subjective: Feels much better this morning. Mild diffuse pain but well controlled with medications.  Objective: Vital signs in last 24 hours: Temp:  [97.5 F (36.4 C)-98 F (36.7 C)] 97.8 F (36.6 C) (02/07 0600) Pulse Rate:  [65-89] 71 (02/07 0600) Resp:  [18-24] 18 (02/07 0600) BP: (110-138)/(71-86) 117/73 mmHg (02/07 0600) SpO2:  [94 %-100 %] 94 % (02/07 0600) Weight:  [192 lb (87.091 kg)] 192 lb (87.091 kg) (02/07 0258) Last BM Date: 11/21/13  Intake/Output from previous day: 02/06 0701 - 02/07 0700 In: 301.7 [I.V.:301.7] Out: 400 [Urine:400] Intake/Output this shift:    General appearance: alert, cooperative and no distress GI: mild diffuse tenderness without guarding. Bowel sounds active. Incision/Wound: clean and dry without evidence of infection  Lab Results:   Recent Labs  11/21/13 0417 11/22/13 0030  WBC 7.8 8.3  HGB 12.1* 14.4  HCT 35.8* 39.8  PLT 193 251   BMET  Recent Labs  11/22/13 0030  NA 136*  K 3.7  CL 96  CO2 25  GLUCOSE 110*  BUN 9  CREATININE 0.68  CALCIUM 9.7     Studies/Results: Dg Abd Portable 2v  11/22/2013   CLINICAL DATA:  48 year old male with abdominal pain. Sigmoid colectomy 5 days ago.  EXAM: PORTABLE ABDOMEN - 2 VIEW  COMPARISON:  10/27/2013 CT  FINDINGS: Gas within the colon is noted.  A few fluid and gas-filled loops of small bowel within the right abdomen are noted -nonspecific.  A tiny amount of pneumoperitoneum is identified, not unexpected postoperatively.  No acute bony abnormalities are present.  IMPRESSION: Nonspecific bowel gas pattern as described -most likely representing focal ileus. Early small bowel obstruction is difficult to entirely exclude. .  Tiny amount of pneumoperitoneum - not unexpected postoperatively.   Electronically Signed   By: Hassan Rowan M.D.   On: 11/22/2013 01:43    Anti-infectives: Anti-infectives   None       Assessment/Plan: Recurrent episodes of abdominal pain post laparoscopic sigmoid colectomy earlier this week. Etiology unclear. Does not appear to have  Does not appear to have a leak or any severe infection clinically. Much improved this morning. CT scan scheduled for this morning for further evaluation.     LOS: 0 days    Danford Tat T 11/22/2013

## 2013-11-23 MED ORDER — IBUPROFEN 600 MG PO TABS
600.0000 mg | ORAL_TABLET | Freq: Four times a day (QID) | ORAL | Status: DC | PRN
  Administered 2013-11-23 – 2013-11-24 (×3): 600 mg via ORAL
  Filled 2013-11-23 (×3): qty 1

## 2013-11-23 NOTE — Progress Notes (Signed)
Patient ID: William [name], male   DOB: 02-17-1966, 48 y.o.   MRN: [id number]    Subjective: No further severe crampy pain as he had on admission. Still requiring pain medication but more for incisional type soreness across his lower abdomen. Tolerated some full liquids and would like to try a soft diet. Has had flatus and no bowel movements.  Objective: Vital signs in last 24 hours: Temp:  [97.4 F (36.3 C)-98.6 F (37 C)] 97.8 F (36.6 C) (02/08 0630) Pulse Rate:  [59-74] 65 (02/08 0630) Resp:  [16-20] 16 (02/08 0630) BP: (112-147)/(71-90) 147/90 mmHg (02/08 0630) SpO2:  [95 %-99 %] 99 % (02/08 0630) Last BM Date: 11/21/13  Intake/Output from previous day: 02/07 0701 - 02/08 0700 In: 2340 [I.V.:2340] Out: 4500 [Urine:4500] Intake/Output this shift:    General appearance: alert, cooperative and no distress GI: very mild tenderness across lower abdomen. Nondistended. Incision/Wound: clean and dry. Still has very faint erythema around his right flank which continues to improve.  Lab Results:   Recent Labs  11/21/13 0417 11/22/13 0030  WBC 7.8 8.3  HGB 12.1* 14.4  HCT 35.8* 39.8  PLT 193 251   BMET  Recent Labs  11/22/13 0030  NA 136*  K 3.7  CL 96  CO2 25  GLUCOSE 110*  BUN 9  CREATININE 0.68  CALCIUM 9.7     Studies/Results: Ct Abdomen Pelvis W Contrast  11/22/2013   CLINICAL DATA:  New onset abdominal pain  EXAM: CT ABDOMEN AND PELVIS WITH CONTRAST  TECHNIQUE: Multidetector CT imaging of the abdomen and pelvis was performed using the standard protocol following bolus administration of intravenous contrast.  CONTRAST:  107mL OMNIPAQUE IOHEXOL 300 MG/ML SOLN, 136mL OMNIPAQUE IOHEXOL 300 MG/ML SOLN  COMPARISON:  Plain film from earlier in the same day.  FINDINGS: The lung bases demonstrate mild left basilar atelectasis.  The gallbladder is been surgically removed. The liver, spleen, adrenal glands and pancreas are within normal limits. The kidneys are well  visualized bilaterally and demonstrate no mass lesion or hydronephrosis. No renal calculi are noted.  Postsurgical changes are identified consistent with the patient's recent sigmoid colectomy. Air is noted within the abdominal wall extending into the scrotum bilaterally. No definitive intraperitoneal air is seen. Few loops of dilated small bowel are identified likely related to a degree of postoperative ileus. Correlation with the physical exam is recommended. No true obstructive changes are seen. Inflammatory changes are noted surrounding the colectomy anastomosis. No perforation is identified. The bladder is well distended. No gross bony abnormality is seen.  IMPRESSION: Mild small bowel dilatation low within the pelvis likely related to a postoperative ileus.  Air is noted within the abdominal wall of extending into the scrotum consistent with the recent surgery. No intraperitoneal air is seen.   Electronically Signed   By: Inez Catalina M.D.   On: 11/22/2013 09:16   Dg Abd Portable 2v  11/22/2013   CLINICAL DATA:  48 year old male with abdominal pain. Sigmoid colectomy 5 days ago.  EXAM: PORTABLE ABDOMEN - 2 VIEW  COMPARISON:  10/27/2013 CT  FINDINGS: Gas within the colon is noted.  A few fluid and gas-filled loops of small bowel within the right abdomen are noted -nonspecific.  A tiny amount of pneumoperitoneum is identified, not unexpected postoperatively.  No acute bony abnormalities are present.  IMPRESSION: Nonspecific bowel gas pattern as described -most likely representing focal ileus. Early small bowel obstruction is difficult to entirely exclude. Chucky May amount of pneumoperitoneum -  not unexpected postoperatively.   Electronically Signed   By: Hassan Rowan M.D.   On: 11/22/2013 01:43    Anti-infectives: Anti-infectives   None      Assessment/Plan: Status post laparoscopic sigmoid colectomy. Readmitted with severe abdominal pain but workup fortunately negative. Possible resolving ileus with  severe cramps. He appears improved with no recurrence of that pain. He had some erythema around his right flank which was noted at discharge. Seems to be improving and there is no evidence of infection in terms of elevated white count or fever and not going to restart his Augmentin in case this may have been aggravating his abdominal/GI symptoms. Advanced to soft diet and observe today.    LOS: 1 day    [name] 11/23/2013

## 2013-11-24 MED ORDER — OXYCODONE-ACETAMINOPHEN 7.5-325 MG PO TABS: 1.0000 | ORAL_TABLET | ORAL | Status: DC | PRN

## 2013-11-24 NOTE — Discharge Summary (Signed)
   Patient ID: [name] [id number] 47 y.o. 1966/04/12  11/22/2013  Discharge date and time: 11/24/2013   Admitting Physician: Excell Seltzer T  Discharge Physician: Excell Seltzer T  Admission Diagnoses: abdominal pain  Discharge Diagnoses: same  Operations: none  Admission Condition: fair  Discharged Condition: good  Indication for Admission: patient is a 48 year old male who underwent laparoscopic sigmoid colectomy for perforated diverticulitis with abscess treated initially with percutaneous drain. He was discharged on postop day #4 doing well. However 24 hours later he developed the onset of severe generalized and crampy abdominal pain and re\re presented to the emergency room in obvious distress.   Hospital Course: The patient was admitted for further evaluation and treatment. Lab work was unremarkable had a CT scan of the abdomen and pelvis was obtained which was essentially normal. After admission with IV pain medication his pain was quickly brought under control and he improved steadily after this point. The source of his pain was not entirely clear but seemed possibly related to resolving ileus with severe cramps. Patient was observed for a day and a half and diet advanced to regular. He continued to feel better daily. On the day of admission he has mild pain controlled with Advil. Tolerating a diet and having normal bowel movements. Abdomen is soft and nontender. Wounds are healing well.   Significant Diagnostic Studies: radiology: CT scan: without leak or other significant abnormality  Treatments: IV hydration and analgesia: Dilaudid  Disposition: Home  Patient Instructions:    Medication List    STOP taking these medications       amoxicillin-clavulanate 875-125 MG per tablet  Commonly known as:  AUGMENTIN     HYDROmorphone 4 MG tablet  Commonly known as:  DILAUDID      TAKE these medications       CULTURELLE PO  Take 1 tablet by mouth daily.     ibuprofen 800 MG tablet  Commonly known as:  ADVIL,MOTRIN  Take 800 mg by mouth every 8 (eight) hours as needed for moderate pain.     lovastatin 40 MG tablet  Commonly known as:  MEVACOR  Take 1 tablet (40 mg total) by mouth at bedtime.     multivitamin with minerals Tabs tablet  Take 2 tablets by mouth daily.     omeprazole 40 MG capsule  Commonly known as:  PRILOSEC  Take 1 capsule (40 mg total) by mouth daily.     ondansetron 4 MG tablet  Commonly known as:  ZOFRAN  Take 4 mg by mouth every 8 (eight) hours as needed for nausea or vomiting.     oxyCODONE-acetaminophen 7.5-325 MG per tablet  Commonly known as:  PERCOCET  Take 1-2 tablets by mouth every 4 (four) hours as needed.     tadalafil 20 MG tablet  Commonly known as:  CIALIS  Take 20 mg by mouth daily as needed for erectile dysfunction.        Activity: activity as tolerated Diet: soft diet Wound Care: none needed  Follow-up:  With Dr. Excell Seltzer in 3 days.  Signed: Edward Jolly MD, FACS  11/24/2013, 11:44 AM

## 2013-11-24 NOTE — Discharge Instructions (Signed)
[facility] ______[facility] °LAPAROSCOPIC SURGERY: POST OP INSTRUCTIONS °Always review your discharge instruction sheet given to you by the facility where your surgery was performed. °IF YOU HAVE DISABILITY OR FAMILY LEAVE FORMS, YOU MUST BRING THEM TO THE OFFICE FOR PROCESSING.   °DO NOT GIVE THEM TO YOUR DOCTOR. ° °1. A prescription for pain medication may be given to you upon discharge.  Take your pain medication as prescribed, if needed.  If narcotic pain medicine is not needed, then you may take acetaminophen (Tylenol) or ibuprofen (Advil) as needed. °2. Take your usually prescribed medications unless otherwise directed. °3. If you need a refill on your pain medication, please contact your pharmacy.  They will contact our office to request authorization. Prescriptions will not be filled after 5pm or on week-ends. °4. You should follow a light diet the first few days after arrival home, such as soup and crackers, etc.  Be sure to include lots of fluids daily. °5. Most patients will experience some swelling and bruising in the area of the incisions.  Ice packs will help.  Swelling and bruising can take several days to resolve.  °6. It is common to experience some constipation if taking pain medication after surgery.  Increasing fluid intake and taking a stool softener (such as Colace) will usually help or prevent this problem from occurring.  A mild laxative (Milk of Magnesia or Miralax) should be taken according to package instructions if there are no bowel movements after 48 hours. °7. Unless discharge instructions indicate otherwise, you may remove your bandages 24-48 hours after surgery, and you may shower at that time.  You may have steri-strips (small skin tapes) in place directly over the incision.  These strips should be left on the skin for 7-10 days.  If your surgeon used skin glue on the incision, you may shower in 24 hours.  The glue will flake off over the next 2-3 weeks.  Any sutures or  staples will be removed at the office during your follow-up visit. °8. ACTIVITIES:  You may resume regular (light) daily activities beginning the next day--such as daily self-care, walking, climbing stairs--gradually increasing activities as tolerated.  You may have sexual intercourse when it is comfortable.  Refrain from any heavy lifting or straining until approved by your doctor. °a. You may drive when you are no longer taking prescription pain medication, you can comfortably wear a seatbelt, and you can safely maneuver your car and apply brakes. °b. RETURN TO WORK:  __________________________________________________________ °9. You should see your doctor in the office for a follow-up appointment approximately 2-3 weeks after your surgery.  Make sure that you call for this appointment within a day or two after you arrive home to insure a convenient appointment time. °10. OTHER INSTRUCTIONS: __________________________________________________________________________________________________________________________ __________________________________________________________________________________________________________________________ °WHEN TO CALL YOUR DOCTOR: °1. Fever over 101.0 °2. Inability to urinate °3. Continued bleeding from incision. °4. Increased pain, redness, or drainage from the incision. °5. Increasing abdominal pain ° °The clinic staff is available to answer your questions during regular business hours.  Please don’t hesitate to call and ask to speak to one of the nurses for clinical concerns.  If you have a medical emergency, go to the nearest emergency room or call 911.  A surgeon from [facility] is always on call at the hospital. °[address], [address], [place], [place]  [address] ? [address] [address], [place],    [address] °[phone] ? [phone] ? FAX [phone] °Web site:   [email or web address] °

## 2013-11-24 NOTE — Progress Notes (Signed)
Patient ID: William [name], male   DOB: [birth year], 48 y.o.   MRN: [id number]    Subjective: Feels much better this morning. Had much less pain last night and just a little sore this morning. Tolerating a soft diet and has had 2 normal bowel movements last night and this morning. Feels he is ready to go home.  Objective: Vital signs in last 24 hours: Temp:  [97.5 F (36.4 C)-98 F (36.7 C)] 97.9 F (36.6 C) (02/09 1013) Pulse Rate:  [64-77] 77 (02/09 1013) Resp:  [16-18] 16 (02/09 1013) BP: (110-132)/(73-92) 111/74 mmHg (02/09 1013) SpO2:  [97 %-100 %] 100 % (02/09 1013) Last BM Date: 11/24/13  Intake/Output from previous day: 02/08 0701 - 02/09 0700 In: 1633.3 [P.O.:360; I.V.:1273.3] Out: 2100 [Urine:2100] Intake/Output this shift: Total I/O In: 360 [P.O.:360] Out: 175 [Urine:175]  General appearance: alert, cooperative and no distress GI: normal findings: soft, non-tender Incision/Wound: all healing well without erythema or drainage. His right flank erythema has entirely resolved.  Lab Results:   Recent Labs  11/22/13 0030  WBC 8.3  HGB 14.4  HCT 39.8  PLT 251   BMET  Recent Labs  11/22/13 0030  NA 136*  K 3.7  CL 96  CO2 25  GLUCOSE 110*  BUN 9  CREATININE 0.68  CALCIUM 9.7     Studies/Results: No results found.  Anti-infectives: Anti-infectives   None      Assessment/Plan: Doing well. The source of his postoperative pain is not clear but likely severe intestinal cramping related to resolving ileus. Okay for discharge.     LOS: 2 days    William [name] T 11/24/2013

## 2013-11-26 ENCOUNTER — Ambulatory Visit (INDEPENDENT_AMBULATORY_CARE_PROVIDER_SITE_OTHER): Payer: BC Managed Care – PPO | Admitting: Family Medicine

## 2013-11-26 ENCOUNTER — Other Ambulatory Visit: Payer: Self-pay | Admitting: Family Medicine

## 2013-11-26 ENCOUNTER — Encounter: Payer: Self-pay | Admitting: Family Medicine

## 2013-11-26 VITALS — BP 120/70 | Temp 97.9°F | Ht 73.0 in | Wt 185.0 lb

## 2013-11-26 DIAGNOSIS — R197 Diarrhea, unspecified: Secondary | ICD-10-CM

## 2013-11-26 NOTE — Progress Notes (Signed)
   Subjective:    Patient ID: William [name], male    DOB: Mar 26, 1966, 48 y.o.   MRN: [id number]  Diarrhea  This is a new problem. The current episode started yesterday. The stool consistency is described as watery. Associated symptoms include abdominal pain, a fever and increased flatus. Associated symptoms comments: Runny nose. Fever last night . Risk factors include recent hospitalization. Treatments tried: Advil and Percocet. The treatment provided mild relief. His past medical history is significant for a recent abdominal surgery.   recent surgery although notes were reviewed.  Low-grade fever last night none today.  Review of Systems  Constitutional: Positive for fever.  Gastrointestinal: Positive for abdominal pain, diarrhea and flatus.   Denies cough wheezing difficulty breathing    Objective:   Physical Exam  Lungs clear heart regular not tachycardic abdomen is soft with some tenderness around the surgical site otherwise doing well      Assessment & Plan:  Diarrhea/low-grade fever-recent surgery. Check C. difficile PCR. Also stool cultures stool WBC. No antibiotics indicated today. Do not recommend any type of Imodium. He may use a probiotic. Soft diet clear liquids.

## 2013-11-27 ENCOUNTER — Encounter (INDEPENDENT_AMBULATORY_CARE_PROVIDER_SITE_OTHER): Payer: BC Managed Care – PPO | Admitting: General Surgery

## 2013-11-27 ENCOUNTER — Encounter (INDEPENDENT_AMBULATORY_CARE_PROVIDER_SITE_OTHER): Payer: Self-pay | Admitting: General Surgery

## 2013-11-27 ENCOUNTER — Ambulatory Visit (INDEPENDENT_AMBULATORY_CARE_PROVIDER_SITE_OTHER): Payer: BC Managed Care – PPO | Admitting: General Surgery

## 2013-11-27 VITALS — BP 118/68 | HR 76 | Temp 97.0°F | Resp 14 | Ht 73.0 in | Wt 182.0 lb

## 2013-11-27 DIAGNOSIS — R309 Painful micturition, unspecified: Secondary | ICD-10-CM

## 2013-11-27 DIAGNOSIS — Z09 Encounter for follow-up examination after completed treatment for conditions other than malignant neoplasm: Secondary | ICD-10-CM

## 2013-11-27 DIAGNOSIS — R300 Dysuria: Secondary | ICD-10-CM

## 2013-11-27 LAB — CLOSTRIDIUM DIFFICILE BY PCR: Toxigenic C. Difficile by PCR: NOT DETECTED

## 2013-11-27 LAB — FECAL LACTOFERRIN, QUANT: Lactoferrin: NEGATIVE

## 2013-11-27 NOTE — Progress Notes (Signed)
History: Patient returns for followup status post microscopic sigmoid colectomy. He was readmitted last week due to severe crampy abdominal pain a day or 2 after discharge but workup including CT was negative and he quickly improved and was discharged. Yesterday he developed the sudden onset of frequent watery diarrhea not associated with any significant abdominal pain. This today has resolved. He saw his family physician and it was felt he may have a viral illness. His stool was checked for C. Difficile which is pending. Again today he has not had any diarrhea and denies abdominal pain or fever or other complaints.  Exam: BP 118/68  Pulse 76  Temp(Src) 97 F (36.1 C) (Oral)  Resp 14  Ht 6\' 1"  (1.854 m)  Wt 182 lb (82.555 kg)  BMI 24.02 kg/m2 General: Appears well Abdomen: Soft and nontender and nondistended. Wounds all well healed.  Assessment and plan: I do not see any evidence of postop complications at this point. I told him to call his physician to check on the C. Difficile results tomorrow. He will call as needed for any problems. He is advanced his diet. I will see him in one month for what should be a final check.

## 2013-11-27 NOTE — Addendum Note (Signed)
Addended by: Ivor Costa on: 11/27/2013 10:59 AM   Modules accepted: Orders

## 2013-11-28 LAB — URINALYSIS
Bilirubin Urine: NEGATIVE
Glucose, UA: NEGATIVE mg/dL
Hgb urine dipstick: NEGATIVE
Ketones, ur: NEGATIVE mg/dL
LEUKOCYTES UA: NEGATIVE
Nitrite: NEGATIVE
Protein, ur: 30 mg/dL — AB
Urobilinogen, UA: 0.2 mg/dL (ref 0.0–1.0)
pH: 5.5 (ref 5.0–8.0)

## 2013-11-30 LAB — STOOL CULTURE

## 2013-12-02 ENCOUNTER — Telehealth (INDEPENDENT_AMBULATORY_CARE_PROVIDER_SITE_OTHER): Payer: Self-pay | Admitting: *Deleted

## 2013-12-02 NOTE — Telephone Encounter (Signed)
Patient called to report that he is having bruising around his incision site that is increasing.  Patient states he had bruising there following his procedure but then it cleared up but has now come back.  Patient also states some sharp pains in this area.  Patient also reports increasing pain with urination.  I did explain to patient that his UA came back normal and did not show blood or bacteria.  Patient states understanding.  I explained that I would send a message to Dr. Excell Seltzer however we will also run his symptoms past one of the other MD's to see if they think there is anything urgent.  Explained that we will give him a call back if they have any further suggestions.  Patient states he is already taking the Ibuprofen 800mg  every 8 hours.  I explained to patient that the pain at his incision site may just be scar tissue that he pulled at and Ibuprofen will help the best with that.  Patient states understanding to all and agreeable with the plan at this time.

## 2013-12-03 NOTE — Progress Notes (Signed)
Patient notified and verbalized understanding. 

## 2013-12-05 ENCOUNTER — Ambulatory Visit (INDEPENDENT_AMBULATORY_CARE_PROVIDER_SITE_OTHER): Payer: BC Managed Care – PPO | Admitting: Surgery

## 2013-12-05 ENCOUNTER — Encounter (INDEPENDENT_AMBULATORY_CARE_PROVIDER_SITE_OTHER): Payer: Self-pay | Admitting: Surgery

## 2013-12-05 VITALS — BP 112/60 | Temp 98.8°F | Resp 16 | Ht 73.0 in | Wt 196.0 lb

## 2013-12-05 DIAGNOSIS — G8918 Other acute postprocedural pain: Secondary | ICD-10-CM

## 2013-12-05 DIAGNOSIS — R109 Unspecified abdominal pain: Secondary | ICD-10-CM

## 2013-12-05 DIAGNOSIS — K5732 Diverticulitis of large intestine without perforation or abscess without bleeding: Secondary | ICD-10-CM

## 2013-12-05 DIAGNOSIS — K5720 Diverticulitis of large intestine with perforation and abscess without bleeding: Secondary | ICD-10-CM

## 2013-12-05 NOTE — Progress Notes (Signed)
General Surgery [facility]  Chief Complaint  Patient presents with  . Post-op Problem    Recheck pain and swelling s/p lap colectomy 11/17/2013 - patient of Dr. Adonis Housekeeper    HISTORY: The patient is a 48 year old male who underwent laparoscopic sigmoid colectomy on 11/17/2013. Patient had been doing well. He has developed some soft tissue swelling and pain in the left lower abdominal wall and left flank. He presents today for evaluation.  EXAM: Vital signs show the patient to be afebrile with normal heart rate and normal blood pressure. Examination of the abdominal wall shows no sign of erythema or cellulitis. Wounds appear to be well healed. Palpation in a standing position with cough and Valsalva showed no sign of incisional hernia. There is no palpable fluctuance. There is tenderness in the left lower quadrant of the abdominal wall extending into the left flank. This appears inflammatory.  IMPRESSION: Status post laparoscopic sigmoid colectomy, postoperative abdominal wall pain, no obvious complications  PLAN: Patient will continue to take Advil for pain. I have also renewed his prescription for Percocet, dispense #30, to use for pain as needed. Patient will continue to do symptomatic measures such as warm showers and using an electric heating pad.  Patient will return to see Dr. Excell Seltzer in 3 weeks.  Earnstine Regal, MD, [facility]   Visit Diagnoses: 1. Diverticulitis of colon with perforation   2. Postoperative abdominal pain

## 2013-12-08 ENCOUNTER — Other Ambulatory Visit: Payer: Self-pay | Admitting: Family Medicine

## 2013-12-08 NOTE — Telephone Encounter (Signed)
I am just back in town and seeing this. Let's call him please to make sure he is improving

## 2013-12-08 NOTE — Telephone Encounter (Signed)
Patient called back and we decided to have him come into urgent office since he was still having issues and I couldn't be sure what was going on.  Patient was seen on 12/05/13 by Dr. Harlow Asa.

## 2013-12-26 ENCOUNTER — Encounter (INDEPENDENT_AMBULATORY_CARE_PROVIDER_SITE_OTHER): Payer: Self-pay | Admitting: General Surgery

## 2013-12-26 ENCOUNTER — Ambulatory Visit (INDEPENDENT_AMBULATORY_CARE_PROVIDER_SITE_OTHER): Payer: BC Managed Care – PPO | Admitting: General Surgery

## 2013-12-26 VITALS — BP 124/90 | HR 74 | Temp 98.1°F | Ht 73.0 in | Wt 201.0 lb

## 2013-12-26 DIAGNOSIS — K5732 Diverticulitis of large intestine without perforation or abscess without bleeding: Secondary | ICD-10-CM

## 2013-12-26 DIAGNOSIS — K5720 Diverticulitis of large intestine with perforation and abscess without bleeding: Secondary | ICD-10-CM

## 2013-12-26 NOTE — Progress Notes (Signed)
Chief complaint: Followup laparoscopic colectomy  History: patient returns for routine followup now 6 weeks following laparoscopic sigmoid colectomy for perforated diverticulitis and fistula. At this point he is doing quite well. Some occasional minimal discomfort with activity. No difficulty with bowel movements or diet.  Exam: BP 124/90  Pulse 74  Temp(Src) 98.1 F (36.7 C) (Oral)  Ht 6\' 1"  (1.854 m)  Wt 201 lb (91.173 kg)  BMI 26.52 kg/m2 General: Appears well Abdomen: Soft and nontender. Wounds all well healed. No hernias or other complications.  Assessment and plan: Doing well following sigmoid colectomy as above. Released to full activity. Call me as needed for any concerns.

## 2013-12-26 NOTE — Patient Instructions (Signed)
No activity limitations. Buildup exercise gradually.

## 2014-03-18 ENCOUNTER — Telehealth: Payer: Self-pay | Admitting: Family Medicine

## 2014-03-18 DIAGNOSIS — Z125 Encounter for screening for malignant neoplasm of prostate: Secondary | ICD-10-CM

## 2014-03-18 DIAGNOSIS — E785 Hyperlipidemia, unspecified: Secondary | ICD-10-CM

## 2014-03-18 DIAGNOSIS — Z79899 Other long term (current) drug therapy: Secondary | ICD-10-CM

## 2014-03-18 NOTE — Telephone Encounter (Signed)
Patient needs Rx for lovastatin 40 mg and omeprazole 40 mg to [facility].  Also, patient was on cialis that he said you only take every so often, but now that he is married, he wants to change to a daily med.

## 2014-03-19 NOTE — Telephone Encounter (Signed)
We've not had a chronic ck up long time, needs lip liv m7 psa plus ov, give mo's worth, we can try cialis 5 daily numb 30 one dailybut tell pt if insur rejects will need ov tod document/justify to the insur co

## 2014-03-20 MED ORDER — OMEPRAZOLE 40 MG PO CPDR: DELAYED_RELEASE_CAPSULE | ORAL | Status: DC

## 2014-03-20 MED ORDER — TADALAFIL 5 MG PO TABS
5.0000 mg | ORAL_TABLET | Freq: Every day | ORAL | Status: DC | PRN
Start: 2014-03-20 — End: 2014-04-09

## 2014-03-20 MED ORDER — LOVASTATIN 40 MG PO TABS: 40.0000 mg | ORAL_TABLET | Freq: Every day | ORAL | Status: DC

## 2014-03-20 NOTE — Telephone Encounter (Signed)
Left message on voicemail notifying patient the meds have been sent to pharmacy and bloodwork has been ordered and he needs to schedule office visit.

## 2014-04-06 LAB — HEPATIC FUNCTION PANEL
ALK PHOS: 56 U/L (ref 39–117)
ALT: 41 U/L (ref 0–53)
AST: 31 U/L (ref 0–37)
Albumin: 5 g/dL (ref 3.5–5.2)
BILIRUBIN DIRECT: 0.1 mg/dL (ref 0.0–0.3)
BILIRUBIN INDIRECT: 0.5 mg/dL (ref 0.2–1.2)
TOTAL PROTEIN: 7.6 g/dL (ref 6.0–8.3)
Total Bilirubin: 0.6 mg/dL (ref 0.2–1.2)

## 2014-04-06 LAB — BASIC METABOLIC PANEL
BUN: 23 mg/dL (ref 6–23)
CALCIUM: 9.6 mg/dL (ref 8.4–10.5)
CO2: 27 mEq/L (ref 19–32)
CREATININE: 0.94 mg/dL (ref 0.50–1.35)
Chloride: 101 mEq/L (ref 96–112)
GLUCOSE: 98 mg/dL (ref 70–99)
Potassium: 4.4 mEq/L (ref 3.5–5.3)
Sodium: 139 mEq/L (ref 135–145)

## 2014-04-06 LAB — LIPID PANEL
CHOLESTEROL: 196 mg/dL (ref 0–200)
HDL: 43 mg/dL (ref >39)
LDL Cholesterol: 118 mg/dL — ABNORMAL HIGH (ref 0–99)
TRIGLYCERIDES: 176 mg/dL — AB (ref <150)
Total CHOL/HDL Ratio: 4.6 Ratio
VLDL: 35 mg/dL (ref 0–40)

## 2014-04-07 LAB — PSA: PSA: 1.65 ng/mL (ref <=4.00)

## 2014-04-08 NOTE — Telephone Encounter (Signed)
Transferred pt up front to scheduled chronic OV

## 2014-04-09 ENCOUNTER — Telehealth: Payer: Self-pay | Admitting: Family Medicine

## 2014-04-09 DIAGNOSIS — E785 Hyperlipidemia, unspecified: Secondary | ICD-10-CM

## 2014-04-09 MED ORDER — LOVASTATIN 40 MG PO TABS: 40.0000 mg | ORAL_TABLET | Freq: Every day | ORAL | Status: DC

## 2014-04-09 MED ORDER — TADALAFIL 5 MG PO TABS: 5.0000 mg | ORAL_TABLET | Freq: Every day | ORAL | Status: DC | PRN

## 2014-04-09 MED ORDER — OMEPRAZOLE 40 MG PO CPDR: DELAYED_RELEASE_CAPSULE | ORAL | Status: DC

## 2014-04-09 NOTE — Telephone Encounter (Signed)
Patient needs Rx for lovastatin (MEVACOR) 40 MG tablet, omeprazole (PRILOSEC) 40 MG capsule, tadalafil (CIALIS) 5 MG tablet. He doesn't have a followup appointment until 04/20/2014.   [facility]

## 2014-04-09 NOTE — Telephone Encounter (Signed)
Refilled meds. Patient notified and verbalized understanding that he needs to keep appt.

## 2014-04-20 ENCOUNTER — Ambulatory Visit (INDEPENDENT_AMBULATORY_CARE_PROVIDER_SITE_OTHER): Payer: BC Managed Care – PPO | Admitting: Family Medicine

## 2014-04-20 ENCOUNTER — Encounter: Payer: Self-pay | Admitting: Family Medicine

## 2014-04-20 VITALS — BP 122/80 | Ht 73.0 in | Wt 210.1 lb

## 2014-04-20 DIAGNOSIS — R109 Unspecified abdominal pain: Secondary | ICD-10-CM

## 2014-04-20 DIAGNOSIS — G8918 Other acute postprocedural pain: Secondary | ICD-10-CM

## 2014-04-20 DIAGNOSIS — E785 Hyperlipidemia, unspecified: Secondary | ICD-10-CM

## 2014-04-20 DIAGNOSIS — K219 Gastro-esophageal reflux disease without esophagitis: Secondary | ICD-10-CM

## 2014-04-20 MED ORDER — LOVASTATIN 40 MG PO TABS: 40.0000 mg | ORAL_TABLET | Freq: Every day | ORAL | Status: DC

## 2014-04-20 MED ORDER — OMEPRAZOLE 40 MG PO CPDR: DELAYED_RELEASE_CAPSULE | ORAL | Status: DC

## 2014-04-20 MED ORDER — TADALAFIL 5 MG PO TABS: 5.0000 mg | ORAL_TABLET | Freq: Every day | ORAL | Status: DC

## 2014-04-20 NOTE — Progress Notes (Signed)
   Subjective:    Patient ID: William [name], male    DOB: 22-Mar-1966, 48 y.o.   MRN: [id number]  Hyperlipidemia This is a chronic problem. The current episode started more than 1 year ago. The problem is controlled. There are no known factors aggravating his hyperlipidemia. Current antihyperlipidemic treatment includes statins. The current treatment provides significant improvement of lipids. There are no compliance problems.  There are no known risk factors for coronary artery disease.   Patient states he has no other concerns at this time. States he is doing very good.   Patient also concerned about diverticulitis. Had the surgery. Some scarring leftover. Still sore in places.  Compliant with reflux medicine. States she definitely needs to take it.  History of elevated blood pressure numbers are now improved. Exercising regularly  Results for orders placed in visit on 03/18/14  LIPID PANEL      Result Value Ref Range   Cholesterol 196  0 - 200 mg/dL   Triglycerides 176 (*) <150 mg/dL   HDL 43  >39 mg/dL   Total CHOL/HDL Ratio 4.6     VLDL 35  0 - 40 mg/dL   LDL Cholesterol 118 (*) 0 - 99 mg/dL  HEPATIC FUNCTION PANEL      Result Value Ref Range   Total Bilirubin 0.6  0.2 - 1.2 mg/dL   Bilirubin, Direct 0.1  0.0 - 0.3 mg/dL   Indirect Bilirubin 0.5  0.2 - 1.2 mg/dL   Alkaline Phosphatase 56  39 - 117 U/L   AST 31  0 - 37 U/L   ALT 41  0 - 53 U/L   Total Protein 7.6  6.0 - 8.3 g/dL   Albumin 5.0  3.5 - 5.2 g/dL  BASIC METABOLIC PANEL      Result Value Ref Range   Sodium 139  135 - 145 mEq/L   Potassium 4.4  3.5 - 5.3 mEq/L   Chloride 101  96 - 112 mEq/L   CO2 27  19 - 32 mEq/L   Glucose, Bld 98  70 - 99 mg/dL   BUN 23  6 - 23 mg/dL   Creat 0.94  0.50 - 1.35 mg/dL   Calcium 9.6  8.4 - 10.5 mg/dL  PSA      Result Value Ref Range   PSA 1.65  <=4.00 ng/mL     Review of Systems No chest pain no headache no back pain no abdominal pain no change in bowel habits no blood in  stool ROS otherwise negative.    Objective:   Physical Exam  Alert pleasant no apparent distress. HEENT normal. Lungs clear. Heart regular rate rhythm. Scarring evident abdominal wall. Some still fresh. No hernia noted      Assessment & Plan:  #1 hyperlipidemia good control. #2 reflux clinically stable on meds. #3 abdominal pain post surgery discussed #4 history of hypertension numbers good control. Plan maintain same meds. Diet exercise discussed. Check every 6 months. WSL

## 2014-06-24 ENCOUNTER — Encounter: Payer: Self-pay | Admitting: Family Medicine

## 2014-06-24 ENCOUNTER — Telehealth: Payer: Self-pay | Admitting: *Deleted

## 2014-06-24 ENCOUNTER — Other Ambulatory Visit: Payer: Self-pay | Admitting: *Deleted

## 2014-06-24 DIAGNOSIS — E785 Hyperlipidemia, unspecified: Secondary | ICD-10-CM

## 2014-06-24 MED ORDER — LOVASTATIN 40 MG PO TABS: 40.0000 mg | ORAL_TABLET | Freq: Every day | ORAL | Status: DC

## 2014-06-24 MED ORDER — OMEPRAZOLE 40 MG PO CPDR: DELAYED_RELEASE_CAPSULE | ORAL | Status: DC

## 2014-06-24 MED ORDER — TADALAFIL 5 MG PO TABS: 5.0000 mg | ORAL_TABLET | Freq: Every day | ORAL | Status: DC

## 2014-06-24 NOTE — Telephone Encounter (Signed)
Pt requesting 90 day on cialis to [facility]. Last seen 04/20/14

## 2014-06-24 NOTE — Telephone Encounter (Signed)
Ok plus one ref 

## 2014-06-24 NOTE — Telephone Encounter (Signed)
Med sent- pt notified

## 2014-07-27 ENCOUNTER — Other Ambulatory Visit: Payer: Self-pay | Admitting: Family Medicine

## 2014-08-10 ENCOUNTER — Encounter: Payer: Self-pay | Admitting: Family Medicine

## 2014-08-10 ENCOUNTER — Ambulatory Visit (INDEPENDENT_AMBULATORY_CARE_PROVIDER_SITE_OTHER): Payer: BC Managed Care – PPO | Admitting: Family Medicine

## 2014-08-10 VITALS — BP 140/84 | Ht 73.0 in | Wt 212.0 lb

## 2014-08-10 DIAGNOSIS — R109 Unspecified abdominal pain: Secondary | ICD-10-CM

## 2014-08-10 MED ORDER — AZITHROMYCIN 250 MG PO TABS: ORAL_TABLET | ORAL | Status: DC

## 2014-08-10 NOTE — Progress Notes (Signed)
   Subjective:    Patient ID: William [name], male    DOB: November 21, 1965, 48 y.o.   MRN: [id number]  HPI Comments: Had an URI, so the coughing makes his abdomen hurt. Also did yard work on Saturday, which made the pain worst.   Abdominal Pain This is a new problem. Episode onset: Saturday. The onset quality is sudden. The problem occurs constantly. The problem has been gradually worsening. The pain is located in the left flank. The pain is moderate. The quality of the pain is aching (soreness). The abdominal pain does not radiate. Associated symptoms include nausea. The pain is aggravated by certain positions, coughing and movement. The pain is relieved by being still. Treatments tried: ibu. The treatment provided mild relief.   Couple wks agpo upper resp mess, took awhjile, eventually faded  Felt soreness left lowe quadrant  Pain with movement and moving of mulch via tarp this weekend  Then pain with deep cough       Review of Systems  Gastrointestinal: Positive for nausea and abdominal pain.   no vomiting no diarrhea no change in bowel habits no rash     Objective:   Physical Exam Alert mild malaise vitals stable afebrile. HEENT normal. Lungs clear heart regular rate and rhythm. Abdominal exam benign left lower groin tender no true hernia       Assessment & Plan:  Impression subacute bronchitis with abdominal strain plan anti-inflammatory medicine. Antibiotics prescribed. Since Medicare discussed. Warning signs discussed.

## 2014-08-24 ENCOUNTER — Encounter: Payer: Self-pay | Admitting: Family Medicine

## 2014-08-24 ENCOUNTER — Ambulatory Visit (INDEPENDENT_AMBULATORY_CARE_PROVIDER_SITE_OTHER): Payer: BC Managed Care – PPO | Admitting: Family Medicine

## 2014-08-24 VITALS — BP 136/82 | Temp 98.7°F | Ht 73.0 in | Wt 218.0 lb

## 2014-08-24 DIAGNOSIS — Z23 Encounter for immunization: Secondary | ICD-10-CM

## 2014-08-24 MED ORDER — AMOXICILLIN-POT CLAVULANATE 875-125 MG PO TABS: 1.0000 | ORAL_TABLET | Freq: Two times a day (BID) | ORAL | Status: DC

## 2014-08-24 MED ORDER — HYDROCODONE-ACETAMINOPHEN 5-325 MG PO TABS: 1.0000 | ORAL_TABLET | Freq: Four times a day (QID) | ORAL | Status: DC | PRN

## 2014-08-24 NOTE — Progress Notes (Signed)
   Subjective:    Patient ID: William [name], male    DOB: 1966-04-05, 48 y.o.   MRN: [id number]  Otalgia  There is pain in the right ear. The current episode started in the past 7 days. He has tried NSAIDs for the symptoms.    More painful over the pawt  Several days  Tried local abx drops  incr pressure and heart beat  No fever  Decent appetitie  Wears ear budss but not working as well   Uses occas perox and alco comb oe   Review of Systems  HENT: Positive for ear pain.   no vomiting no diarrhea no rash     Objective:   Physical Exam  Alert good hydration impressive right otitis media with bulging. Pharynx normal neck supple. Lungs clear. Heart regular rate and rhythm.      Assessment & Plan:  Impression right otitis media impressive discussed plan Augmentin twice a day 10 days. Since Medicare discussed. WSL

## 2014-08-26 ENCOUNTER — Telehealth: Payer: Self-pay | Admitting: Family Medicine

## 2014-08-26 MED ORDER — OFLOXACIN 0.3 % OT SOLN: 5.0000 [drp] | Freq: Two times a day (BID) | OTIC | Status: AC

## 2014-08-26 MED ORDER — DOXYCYCLINE HYCLATE 100 MG PO TABS: 100.0000 mg | ORAL_TABLET | Freq: Two times a day (BID) | ORAL | Status: DC

## 2014-08-26 NOTE — Telephone Encounter (Signed)
Patient notified and verbalized understanding. 

## 2014-08-26 NOTE — Telephone Encounter (Signed)
Patient was seen for inner ear infection on 08/24/2014 and says that he is much worse today.  He compares it to "cauliflower ear". Please advise.

## 2014-08-26 NOTE — Telephone Encounter (Signed)
Add doxy 100 bid ten d and floxin five drops bid affected ear 7 d

## 2014-08-26 NOTE — Telephone Encounter (Signed)
Given augmentin and vicodin. He said the pain and swelling is worst. No fever.

## 2014-08-28 ENCOUNTER — Encounter: Payer: Self-pay | Admitting: Family Medicine

## 2014-08-28 ENCOUNTER — Telehealth: Payer: Self-pay | Admitting: Family Medicine

## 2014-08-28 ENCOUNTER — Ambulatory Visit (INDEPENDENT_AMBULATORY_CARE_PROVIDER_SITE_OTHER): Payer: BC Managed Care – PPO | Admitting: Family Medicine

## 2014-08-28 VITALS — Temp 98.6°F | Ht 73.0 in | Wt 213.5 lb

## 2014-08-28 DIAGNOSIS — H6001 Abscess of right external ear: Secondary | ICD-10-CM

## 2014-08-28 DIAGNOSIS — H6091 Unspecified otitis externa, right ear: Secondary | ICD-10-CM

## 2014-08-28 MED ORDER — PROMETHAZINE HCL 25 MG PO TABS: 25.0000 mg | ORAL_TABLET | Freq: Two times a day (BID) | ORAL | Status: DC | PRN

## 2014-08-28 MED ORDER — LEVOFLOXACIN 500 MG PO TABS: 500.0000 mg | ORAL_TABLET | Freq: Every day | ORAL | Status: DC

## 2014-08-28 NOTE — Telephone Encounter (Signed)
Patient states that he was diagnosed with an inner ear infection this past Monday.  He said that the pain has gotten worse and he has ringing in ears and nausea.  He wants to know if we can call in something for the nausea?   Larene Pickett

## 2014-08-28 NOTE — Progress Notes (Signed)
   Subjective:    Patient ID: William [name], male    DOB: Sep 17, 1966, 48 y.o.   MRN: [id number]  Otalgia  There is pain in the right ear. This is a new problem. The current episode started in the past 7 days. The problem occurs constantly. The problem has been unchanged. There has been no fever. The pain is moderate. Associated symptoms include vomiting. Pertinent negatives include no coughing or rhinorrhea. Associated symptoms comments: nausea. He has tried ear drops and antibiotics for the symptoms. The treatment provided no relief.   Patient states he has no other concerns at this time.   Previous notes were reviewed. Review of Systems  Constitutional: Negative for fever and activity change.  HENT: Positive for ear pain. Negative for congestion and rhinorrhea.   Eyes: Negative for discharge.  Respiratory: Negative for cough and wheezing.   Cardiovascular: Negative for chest pain.  Gastrointestinal: Positive for vomiting.       Objective:   Physical Exam  Constitutional: He appears well-developed.  HENT:  Head: Normocephalic.  Mouth/Throat: Oropharynx is clear and moist. No oropharyngeal exudate.  Neck: Normal range of motion.  Cardiovascular: Normal rate, regular rhythm and normal heart sounds.   No murmur heard. Pulmonary/Chest: Effort normal and breath sounds normal. He has no wheezes.  Lymphadenopathy:    He has no cervical adenopathy.  Neurological: He exhibits normal muscle tone.  Skin: Skin is warm and dry.  Nursing note and vitals reviewed.         Assessment & Plan:  Severe otitis externa change to Levaquin daily for the next 10 days to cover for possibility of Pseudomonas culture was sent. Stop Augmentin. Finish doxycycline. If progressive troubles let us know. Phenergan for nausea cautioned drowsiness.

## 2014-08-28 NOTE — Telephone Encounter (Signed)
Patient states he feels like he is getting worse. Office visit scheduled today for follow up.

## 2014-08-31 LAB — WOUND CULTURE
GRAM STAIN: NONE SEEN
Gram Stain: NONE SEEN
Organism ID, Bacteria: NO GROWTH

## 2014-12-07 ENCOUNTER — Ambulatory Visit (INDEPENDENT_AMBULATORY_CARE_PROVIDER_SITE_OTHER): Payer: BLUE CROSS/BLUE SHIELD | Admitting: Family Medicine

## 2014-12-07 ENCOUNTER — Encounter: Payer: Self-pay | Admitting: Family Medicine

## 2014-12-07 VITALS — BP 140/90 | Ht 73.0 in | Wt 223.0 lb

## 2014-12-07 DIAGNOSIS — J329 Chronic sinusitis, unspecified: Secondary | ICD-10-CM

## 2014-12-07 DIAGNOSIS — M25512 Pain in left shoulder: Secondary | ICD-10-CM

## 2014-12-07 DIAGNOSIS — E785 Hyperlipidemia, unspecified: Secondary | ICD-10-CM

## 2014-12-07 DIAGNOSIS — Z79899 Other long term (current) drug therapy: Secondary | ICD-10-CM

## 2014-12-07 DIAGNOSIS — K219 Gastro-esophageal reflux disease without esophagitis: Secondary | ICD-10-CM

## 2014-12-07 MED ORDER — OMEPRAZOLE 40 MG PO CPDR: DELAYED_RELEASE_CAPSULE | ORAL | Status: DC

## 2014-12-07 MED ORDER — CEFDINIR 300 MG PO CAPS: 300.0000 mg | ORAL_CAPSULE | Freq: Two times a day (BID) | ORAL | Status: AC

## 2014-12-07 MED ORDER — ETODOLAC 400 MG PO TABS: 400.0000 mg | ORAL_TABLET | Freq: Two times a day (BID) | ORAL | Status: DC

## 2014-12-07 MED ORDER — LOVASTATIN 40 MG PO TABS: 40.0000 mg | ORAL_TABLET | Freq: Every day | ORAL | Status: DC

## 2014-12-07 NOTE — Progress Notes (Signed)
   Subjective:    Patient ID: William [name], male    DOB: Feb 09, 1966, 49 y.o.   MRN: [id number]  HPI Patient is here today for a 6 month check up.  Pt c/o of shoulder pain in both shoulders. Pain is getting worst. He believes it is arthritis. Keeping him up at night.  Still having a lingering cough from Christmas time., coughin  Off and on. Some congestion . On nasocort long term, not sig cough and drainage.    Sticking with chol meds, taking well, watching diet and dxercise , busy with things  Recent yrs shoulder pain flared up in the fall   Reflux overall stable. Compliant with medicines. No obvious side effects.  Worse in the gym  Now not going away  Hx of degen pain, uses glucosamin chondroiton sulfate  ibu 800 not helping aleave not doing uch    compliant with lipid medication. No obvious side effects. Has cut down fats in the diet.   Review of Systems  no headache no chest pain no back pain no abdominal pain no change in bowel habits    Objective:   Physical Exam   alert vital signs stable HEENT moderate his congestion frontal tenderness pharynx normal neck supple. Lungs clear heart regular in rhythm. Ankles without edema.    procedure note patient was draped anesthetized injected 1 mL Depo-Medrol 2 mL Xylocaine.      Assessment & Plan:   impression 1 rhinosinusitis #2 hyperlipidemia  Control uncertain #3 reflux good control medications. #4 worsening left shoulder pain history of injection helping in the past. Plan blood work further recommendations based results. Antibiotics prescribed. Lipid medicines refilled. Reflux meds refilled. Local measures discussed for shoulder. WSL

## 2014-12-08 DIAGNOSIS — E785 Hyperlipidemia, unspecified: Secondary | ICD-10-CM | POA: Insufficient documentation

## 2014-12-08 MED ORDER — METHYLPREDNISOLONE ACETATE 40 MG/ML IJ SUSP: 40.0000 mg | Freq: Once | INTRAMUSCULAR | Status: DC

## 2015-01-18 ENCOUNTER — Other Ambulatory Visit: Payer: Self-pay | Admitting: Family Medicine

## 2015-01-22 ENCOUNTER — Encounter: Payer: Self-pay | Admitting: Family Medicine

## 2015-01-25 ENCOUNTER — Other Ambulatory Visit: Payer: Self-pay | Admitting: *Deleted

## 2015-01-25 MED ORDER — ZOLPIDEM TARTRATE 10 MG PO TABS
10.0000 mg | ORAL_TABLET | Freq: Every evening | ORAL | Status: DC | PRN
Start: 2015-01-25 — End: 2015-05-17

## 2015-01-27 ENCOUNTER — Other Ambulatory Visit: Payer: Self-pay | Admitting: *Deleted

## 2015-01-27 NOTE — Telephone Encounter (Signed)
Med faxed to pharm. [facility] to notify pt

## 2015-02-14 HISTORY — PX: SHOULDER SURGERY: SHX246

## 2015-02-17 ENCOUNTER — Other Ambulatory Visit: Payer: Self-pay | Admitting: *Deleted

## 2015-02-23 ENCOUNTER — Encounter: Payer: Self-pay | Admitting: Family Medicine

## 2015-02-24 ENCOUNTER — Other Ambulatory Visit: Payer: Self-pay | Admitting: *Deleted

## 2015-02-24 MED ORDER — TADALAFIL 5 MG PO TABS: 5.0000 mg | ORAL_TABLET | Freq: Every day | ORAL | Status: DC

## 2015-03-11 ENCOUNTER — Ambulatory Visit (INDEPENDENT_AMBULATORY_CARE_PROVIDER_SITE_OTHER): Payer: BLUE CROSS/BLUE SHIELD | Admitting: Family Medicine

## 2015-03-11 ENCOUNTER — Encounter: Payer: Self-pay | Admitting: Family Medicine

## 2015-03-11 VITALS — BP 122/78 | Temp 98.3°F | Ht 73.0 in | Wt 234.0 lb

## 2015-03-11 DIAGNOSIS — J029 Acute pharyngitis, unspecified: Secondary | ICD-10-CM

## 2015-03-11 DIAGNOSIS — I889 Nonspecific lymphadenitis, unspecified: Secondary | ICD-10-CM | POA: Diagnosis not present

## 2015-03-11 LAB — POCT RAPID STREP A (OFFICE): RAPID STREP A SCREEN: NEGATIVE

## 2015-03-11 MED ORDER — CLARITHROMYCIN 500 MG PO TABS: 500.0000 mg | ORAL_TABLET | Freq: Two times a day (BID) | ORAL | Status: DC

## 2015-03-11 MED ORDER — MAGIC MOUTHWASH: ORAL | Status: DC

## 2015-03-11 NOTE — Progress Notes (Signed)
   Subjective:    Patient ID: Saahil Herbster, male    DOB: 09/29/1966, 49 y.o.   MRN: [id number]  Sore Throat  This is a new problem. Episode onset: 5 days ago. Associated symptoms include ear pain. Treatments tried: zyrtec.    Off nasocort and using claritin  Using zyrtec now     Sore throat this morn,  No change in pressure with the throat   Diminished energy. Throat worse in the morning. No obvious cough some allergies with drainage  Review of Systems  HENT: Positive for ear pain.    No vomiting no diarrhea no rash    Objective:   Physical Exam Alert moderate malaise HEENT left TM retracted pharynx erythematous tender anterior nodes left greater than right lungs clear. Heart regular rate and rhythm.       Assessment & Plan:  Impression 1 pharyngitis/cervical lymphadenitis negative strep screen. #2 allergic rhinitis discussed plan Biaxin 500 twice a day 10 days. Possible eustachian tube dysfunction. Symptom care discussed WSL

## 2015-03-16 ENCOUNTER — Telehealth: Payer: Self-pay | Admitting: Family Medicine

## 2015-03-16 MED ORDER — CEFDINIR 300 MG PO CAPS: 300.0000 mg | ORAL_CAPSULE | Freq: Two times a day (BID) | ORAL | Status: DC

## 2015-03-16 NOTE — Telephone Encounter (Signed)
Pt called stating that the antibiotics he was prescribed last week has caused him vomit,and had horrible headaches. Pt wants to know if something else can be called in. Pt hasn't taken it since Sat. Pt doesn't want to relapse seems to be getting somewhat better but still has the sore throat.    [facility] [address]

## 2015-03-16 NOTE — Addendum Note (Signed)
Addended by: Dairl Ponder on: 03/16/2015 04:27 PM   Modules accepted: Orders

## 2015-03-16 NOTE — Telephone Encounter (Signed)
Put biaxin in allergy list omnicef 300 bid ten d

## 2015-03-16 NOTE — Telephone Encounter (Signed)
Rx sent in electronically to pharmacy. Patient notified. 

## 2015-03-16 NOTE — Telephone Encounter (Signed)
Patient given Biaxin

## 2015-04-02 ENCOUNTER — Encounter: Payer: Self-pay | Admitting: Family Medicine

## 2015-04-02 DIAGNOSIS — Z125 Encounter for screening for malignant neoplasm of prostate: Secondary | ICD-10-CM

## 2015-04-02 DIAGNOSIS — Z79899 Other long term (current) drug therapy: Secondary | ICD-10-CM

## 2015-04-02 DIAGNOSIS — E785 Hyperlipidemia, unspecified: Secondary | ICD-10-CM

## 2015-04-02 MED ORDER — TRAZODONE HCL 50 MG PO TABS: 50.0000 mg | ORAL_TABLET | Freq: Every evening | ORAL | Status: DC | PRN

## 2015-04-02 NOTE — Telephone Encounter (Signed)
Patient was notified blood work has been ordered and schedule appointment for July per Dr. Richardson Landry. Medication was sent to pharmacy.

## 2015-04-07 ENCOUNTER — Other Ambulatory Visit: Payer: Self-pay | Admitting: *Deleted

## 2015-04-07 MED ORDER — OMEPRAZOLE 40 MG PO CPDR: DELAYED_RELEASE_CAPSULE | ORAL | Status: DC

## 2015-04-12 ENCOUNTER — Encounter: Payer: Self-pay | Admitting: Family Medicine

## 2015-04-12 MED ORDER — SCOPOLAMINE 1 MG/3DAYS TD PT72: 1.0000 | MEDICATED_PATCH | TRANSDERMAL | Status: DC

## 2015-04-29 ENCOUNTER — Other Ambulatory Visit: Payer: Self-pay | Admitting: Family Medicine

## 2015-04-30 ENCOUNTER — Encounter: Payer: Self-pay | Admitting: Family Medicine

## 2015-04-30 NOTE — Telephone Encounter (Signed)
Needs office visit.

## 2015-05-11 ENCOUNTER — Telehealth: Payer: Self-pay | Admitting: Family Medicine

## 2015-05-11 NOTE — Telephone Encounter (Signed)
Rx prior auth DENIED for pt's tadalafil (CIALIS) 5 MG tablet, we received same denial letter that [facility] also sent to pt (he does not have qualifying diagnosis)

## 2015-05-13 LAB — PSA: Prostate Specific Ag, Serum: 1.6 ng/mL (ref 0.0–4.0)

## 2015-05-13 LAB — HEPATIC FUNCTION PANEL
ALK PHOS: 60 IU/L (ref 39–117)
ALT: 24 IU/L (ref 0–44)
AST: 18 IU/L (ref 0–40)
Albumin: 4.6 g/dL (ref 3.5–5.5)
BILIRUBIN TOTAL: 0.4 mg/dL (ref 0.0–1.2)
BILIRUBIN, DIRECT: 0.1 mg/dL (ref 0.00–0.40)
Total Protein: 6.8 g/dL (ref 6.0–8.5)

## 2015-05-13 LAB — BASIC METABOLIC PANEL
BUN/Creatinine Ratio: 13 (ref 9–20)
BUN: 13 mg/dL (ref 6–24)
CALCIUM: 9.5 mg/dL (ref 8.7–10.2)
CO2: 23 mmol/L (ref 18–29)
CREATININE: 1.01 mg/dL (ref 0.76–1.27)
Chloride: 101 mmol/L (ref 97–108)
GFR calc non Af Amer: 86 mL/min/{1.73_m2} (ref >59)
GFR, EST AFRICAN AMERICAN: 99 mL/min/{1.73_m2} (ref >59)
Glucose: 109 mg/dL — ABNORMAL HIGH (ref 65–99)
Potassium: 4.5 mmol/L (ref 3.5–5.2)
Sodium: 142 mmol/L (ref 134–144)

## 2015-05-13 LAB — LIPID PANEL
CHOL/HDL RATIO: 3.7 ratio (ref 0.0–5.0)
Cholesterol, Total: 151 mg/dL (ref 100–199)
HDL: 41 mg/dL (ref >39)
LDL CALC: 89 mg/dL (ref 0–99)
Triglycerides: 107 mg/dL (ref 0–149)
VLDL Cholesterol Cal: 21 mg/dL (ref 5–40)

## 2015-05-17 ENCOUNTER — Encounter: Payer: Self-pay | Admitting: Family Medicine

## 2015-05-17 ENCOUNTER — Ambulatory Visit (INDEPENDENT_AMBULATORY_CARE_PROVIDER_SITE_OTHER): Payer: BLUE CROSS/BLUE SHIELD | Admitting: Family Medicine

## 2015-05-17 VITALS — BP 132/88 | Ht 73.0 in | Wt 220.0 lb

## 2015-05-17 DIAGNOSIS — M545 Low back pain: Secondary | ICD-10-CM | POA: Diagnosis not present

## 2015-05-17 DIAGNOSIS — E785 Hyperlipidemia, unspecified: Secondary | ICD-10-CM

## 2015-05-17 DIAGNOSIS — G4700 Insomnia, unspecified: Secondary | ICD-10-CM | POA: Diagnosis not present

## 2015-05-17 DIAGNOSIS — K219 Gastro-esophageal reflux disease without esophagitis: Secondary | ICD-10-CM

## 2015-05-17 MED ORDER — TRAZODONE HCL 50 MG PO TABS: ORAL_TABLET | ORAL | Status: DC

## 2015-05-17 MED ORDER — MELOXICAM 15 MG PO TABS: 15.0000 mg | ORAL_TABLET | Freq: Every day | ORAL | Status: DC

## 2015-05-17 MED ORDER — LOVASTATIN 40 MG PO TABS: 40.0000 mg | ORAL_TABLET | Freq: Every day | ORAL | Status: DC

## 2015-05-17 NOTE — Progress Notes (Signed)
   Subjective:    Patient ID: William [name], male    DOB: 03-15-1966, 49 y.o.   MRN: [id number] Patient arrives office with numerous concerns. Hyperlipidemia This is a chronic problem. The current episode started more than 1 year ago. There are no compliance problems.     pt is exercising and eating healthy. Has lost about 12 lbs since last visit.    Discuss continuing the trazodone. Hx of early a m awakening,  Now using trazadone often using 2 each night. Definitely helps sleep  Now not using ,  One to 2 qhs prn sleep,   Results for orders placed or performed in visit on 04/02/15  Lipid panel  Result Value Ref Range   Cholesterol, Total 151 100 - 199 mg/dL   Triglycerides 107 0 - 149 mg/dL   HDL 41 >39 mg/dL   VLDL Cholesterol Cal 21 5 - 40 mg/dL   LDL Calculated 89 0 - 99 mg/dL   Chol/HDL Ratio 3.7 0.0 - 5.0 ratio units  Hepatic function panel  Result Value Ref Range   Total Protein 6.8 6.0 - 8.5 g/dL   Albumin 4.6 3.5 - 5.5 g/dL   Bilirubin Total 0.4 0.0 - 1.2 mg/dL   Bilirubin, Direct 0.10 0.00 - 0.40 mg/dL   Alkaline Phosphatase 60 39 - 117 IU/L   AST 18 0 - 40 IU/L   ALT 24 0 - 44 IU/L  PSA  Result Value Ref Range   Prostate Specific Ag, Serum 1.6 0.0 - 4.0 ng/mL  Basic metabolic panel  Result Value Ref Range   Glucose 109 (H) 65 - 99 mg/dL   BUN 13 6 - 24 mg/dL   Creatinine, Ser 1.01 0.76 - 1.27 mg/dL   GFR calc non Af Amer 86 >59 mL/min/1.73   GFR calc Af Amer 99 >59 mL/min/1.73   BUN/Creatinine Ratio 13 9 - 20   Sodium 142 134 - 144 mmol/L   Potassium 4.5 3.5 - 5.2 mmol/L   Chloride 101 97 - 108 mmol/L   CO2 23 18 - 29 mmol/L   Calcium 9.5 8.7 - 10.2 mg/dL    Low back four to six months, rad to hip, not leg, stries to reg low and back stretching and abd work at times,  Had surgery on left shoulder and it is doing much better.shoulder stilll sings out at times,  Looking to having the right shoulder repaired, with rehab  No substantial abdominal  pain or change in bowel habits. Last colonoscopy 10 years ago.    Review of Systems No weight gain a little weight loss with exercise no chest pain no headache good appetite    Objective:   Physical Exam Alert vital stable no acute distress. H&T normal. Neck supple. Lungs clear. Heart regular in rhythm. Ankles setting.  Low back pain to deep palpation negative straight leg raise     Assessment & Plan:  Impression 1 insomnia clinically improved on trazodone No. 2 hyperlipidemia controlled good discuss #3 reflux clinically stable chronic low back pain worse response plan low back x-ray. Encouraged to call GI set up colonoscopy. An inflammatory medicine prescribed. Maintain chronic meds. Diet exercise discussed. Check every 6 months. WSL

## 2015-05-18 ENCOUNTER — Telehealth: Payer: Self-pay

## 2015-05-18 ENCOUNTER — Other Ambulatory Visit: Payer: Self-pay

## 2015-05-18 DIAGNOSIS — Z1211 Encounter for screening for malignant neoplasm of colon: Secondary | ICD-10-CM

## 2015-05-18 DIAGNOSIS — G4700 Insomnia, unspecified: Secondary | ICD-10-CM | POA: Insufficient documentation

## 2015-05-19 ENCOUNTER — Ambulatory Visit (HOSPITAL_COMMUNITY)
Admission: RE | Admit: 2015-05-19 | Discharge: 2015-05-19 | Disposition: A | Discharge status: Home / Self Care | Payer: BLUE CROSS/BLUE SHIELD | Source: Ambulatory Visit | Attending: Family Medicine | Admitting: Family Medicine

## 2015-05-19 DIAGNOSIS — M545 Low back pain: Secondary | ICD-10-CM | POA: Diagnosis present

## 2015-05-19 DIAGNOSIS — M4186 Other forms of scoliosis, lumbar region: Secondary | ICD-10-CM | POA: Insufficient documentation

## 2015-05-20 NOTE — Telephone Encounter (Signed)
Gastroenterology Pre-Procedure Review  Request Date: 06/09/2015 Requesting Physician: Dr. Mickie Hillier  PATIENT REVIEW QUESTIONS: The patient responded to the following health history questions as indicated:    LAST COLONOSCOPY WAS 06/30/2005 BY DR. Gala Romney PT SAID HIS FATHER AND MATERNAL GRANDMOTHER HAD COLON CANCER   1. Diabetes Melitis: no 2. Joint replacements in the past 12 months: no 3. Major health problems in the past 3 months: no 4. Has an artificial valve or MVP: no 5. Has a defibrillator: no 6. Has been advised in past to take antibiotics in advance of a procedure like teeth cleaning: no    MEDICATIONS & ALLERGIES:    Patient reports the following regarding taking any blood thinners:   Plavix? no Aspirin? no Coumadin? no  Patient confirms/reports the following medications:  Current Outpatient Prescriptions  Medication Sig Dispense Refill  . lovastatin (MEVACOR) 40 MG tablet Take 1 tablet (40 mg total) by mouth at bedtime. 90 tablet 1  . meloxicam (MOBIC) 15 MG tablet Take 1 tablet (15 mg total) by mouth daily. For back pain 30 tablet 1  . Multiple Vitamin (MULTIVITAMIN WITH MINERALS) TABS tablet Take 2 tablets by mouth daily.    Marland Kitchen omeprazole (PRILOSEC) 40 MG capsule TAKE 1 CAPSULE DAILY 90 capsule 1  . traZODone (DESYREL) 50 MG tablet Take one to two tablets qhs prn sleep 60 tablet 5  . tadalafil (CIALIS) 5 MG tablet Take 1 tablet (5 mg total) by mouth daily. 90 tablet 3   Current Facility-Administered Medications  Medication Dose Route Frequency Provider Last Rate Last Dose  . methylPREDNISolone acetate (DEPO-MEDROL) injection 40 mg  40 mg Intramuscular Once Mikey Kirschner, MD        Patient confirms/reports the following allergies:  Allergies  Allergen Reactions  . Biaxin [Clarithromycin]     Headache and vomiting    No orders of the defined types were placed in this encounter.    AUTHORIZATION INFORMATION Primary Insurance:   ID #:   Group #:  Pre-Cert /  Auth required:  Pre-Cert / Auth #:   Secondary Insurance:  ID #:  Group #:  Pre-Cert / Auth required:  Pre-Cert / Auth #:   SCHEDULE INFORMATION: Procedure has been scheduled as follows:  Date: 06/09/2015              Time: 11:30 am  Location: [facility] Short Stay  This Gastroenterology Pre-Precedure Review Form is being routed to the following provider(s): R. Garfield Cornea, MD

## 2015-05-20 NOTE — Telephone Encounter (Signed)
Appropriate.

## 2015-05-27 NOTE — Telephone Encounter (Signed)
Pt is scheduled for colonoscopy on 06/09/2015. He called to say he was put on Prednisone Dosepak today for six days due to some lower back issues. He just wanted to let us know and see if he needs to reschedule. I told him that will not require him to be rescheduled and he can keep the appt as scheduled for the colonoscopy.

## 2015-05-31 MED ORDER — PEG-KCL-NACL-NASULF-NA ASC-C 100 G PO SOLR: 1.0000 | ORAL | Status: DC

## 2015-05-31 NOTE — Telephone Encounter (Signed)
Noted and agree. 

## 2015-05-31 NOTE — Telephone Encounter (Signed)
Rx sent to the pharmacy and instructions mailed to pt.  

## 2015-06-07 ENCOUNTER — Telehealth: Payer: Self-pay

## 2015-06-07 NOTE — Telephone Encounter (Signed)
I called [employer] @ [phone] and got the automation. It said that PA is not required for out patient procedures.

## 2015-06-09 ENCOUNTER — Encounter (HOSPITAL_COMMUNITY): Payer: Self-pay | Admitting: *Deleted

## 2015-06-09 ENCOUNTER — Encounter (HOSPITAL_COMMUNITY)
Admission: RE | Disposition: A | Discharge status: Home / Self Care | Payer: Self-pay | Source: Ambulatory Visit | Attending: Internal Medicine

## 2015-06-09 ENCOUNTER — Ambulatory Visit (HOSPITAL_COMMUNITY)
Admission: RE | Admit: 2015-06-09 | Discharge: 2015-06-09 | Disposition: A | Discharge status: Home / Self Care | Payer: BLUE CROSS/BLUE SHIELD | Source: Ambulatory Visit | Attending: Internal Medicine | Admitting: Internal Medicine

## 2015-06-09 DIAGNOSIS — Z87891 Personal history of nicotine dependence: Secondary | ICD-10-CM | POA: Diagnosis not present

## 2015-06-09 DIAGNOSIS — K5730 Diverticulosis of large intestine without perforation or abscess without bleeding: Secondary | ICD-10-CM | POA: Insufficient documentation

## 2015-06-09 DIAGNOSIS — E785 Hyperlipidemia, unspecified: Secondary | ICD-10-CM | POA: Diagnosis not present

## 2015-06-09 DIAGNOSIS — Z800 Family history of malignant neoplasm of digestive organs: Secondary | ICD-10-CM | POA: Insufficient documentation

## 2015-06-09 DIAGNOSIS — Z9049 Acquired absence of other specified parts of digestive tract: Secondary | ICD-10-CM | POA: Insufficient documentation

## 2015-06-09 DIAGNOSIS — K219 Gastro-esophageal reflux disease without esophagitis: Secondary | ICD-10-CM | POA: Insufficient documentation

## 2015-06-09 DIAGNOSIS — Z79899 Other long term (current) drug therapy: Secondary | ICD-10-CM | POA: Insufficient documentation

## 2015-06-09 DIAGNOSIS — Z1211 Encounter for screening for malignant neoplasm of colon: Secondary | ICD-10-CM | POA: Diagnosis present

## 2015-06-09 HISTORY — PX: COLONOSCOPY: SHX5424

## 2015-06-09 SURGERY — COLONOSCOPY
Anesthesia: Moderate Sedation

## 2015-06-09 MED ORDER — MIDAZOLAM HCL 5 MG/5ML IJ SOLN
INTRAMUSCULAR | Status: AC
  Filled 2015-06-09: qty 10

## 2015-06-09 MED ORDER — ONDANSETRON HCL 4 MG/2ML IJ SOLN
INTRAMUSCULAR | Status: AC
  Filled 2015-06-09: qty 2

## 2015-06-09 MED ORDER — SODIUM CHLORIDE 0.9 % IV SOLN
INTRAVENOUS | Status: DC
  Administered 2015-06-09: 11:00:00 via INTRAVENOUS

## 2015-06-09 MED ORDER — STERILE WATER FOR IRRIGATION IR SOLN
Status: DC | PRN
Start: 2015-06-09 — End: 2015-06-09
  Administered 2015-06-09: 11:00:00

## 2015-06-09 MED ORDER — MIDAZOLAM HCL 5 MG/5ML IJ SOLN
INTRAMUSCULAR | Status: DC | PRN
Start: 2015-06-09 — End: 2015-06-09
  Administered 2015-06-09 (×2): 2 mg via INTRAVENOUS
  Administered 2015-06-09 (×3): 1 mg via INTRAVENOUS

## 2015-06-09 MED ORDER — MEPERIDINE HCL 100 MG/ML IJ SOLN
INTRAMUSCULAR | Status: AC
  Filled 2015-06-09: qty 2

## 2015-06-09 MED ORDER — ONDANSETRON HCL 4 MG/2ML IJ SOLN
INTRAMUSCULAR | Status: DC | PRN
Start: 2015-06-09 — End: 2015-06-09
  Administered 2015-06-09: 4 mg via INTRAVENOUS

## 2015-06-09 MED ORDER — MEPERIDINE HCL 100 MG/ML IJ SOLN
INTRAMUSCULAR | Status: DC | PRN
  Administered 2015-06-09: 25 mg via INTRAVENOUS
  Administered 2015-06-09 (×2): 50 mg via INTRAVENOUS
  Administered 2015-06-09: 25 mg via INTRAVENOUS

## 2015-06-09 NOTE — Discharge Instructions (Signed)
Diverticulosis Diverticulosis is the condition that develops when small pouches (diverticula) form in the wall of your colon. Your colon, or large intestine, is where water is absorbed and stool is formed. The pouches form when the inside layer of your colon pushes through weak spots in the outer layers of your colon. CAUSES  No one knows exactly what causes diverticulosis. RISK FACTORS  Being older than 57. Your risk for this condition increases with age. Diverticulosis is rare in people younger than 40 years. By age 54, almost everyone has it.  Eating a low-fiber diet.  Being frequently constipated.  Being overweight.  Not getting enough exercise.  Smoking.  Taking over-the-counter pain medicines, like aspirin and ibuprofen. SYMPTOMS  Most people with diverticulosis do not have symptoms. DIAGNOSIS  Because diverticulosis often has no symptoms, health care providers often discover the condition during an exam for other colon problems. In many cases, a health care provider will diagnose diverticulosis while using a flexible scope to examine the colon (colonoscopy). TREATMENT  If you have never developed an infection related to diverticulosis, you may not need treatment. If you have had an infection before, treatment may include:  Eating more fruits, vegetables, and grains.  Taking a fiber supplement.  Taking a live bacteria supplement (probiotic).  Taking medicine to relax your colon. HOME CARE INSTRUCTIONS   Drink at least 6-8 glasses of water each day to prevent constipation.  Try not to strain when you have a bowel movement.  Keep all follow-up appointments. If you have had an infection before:  Increase the fiber in your diet as directed by your health care provider or dietitian.  Take a dietary fiber supplement if your health care provider approves.  Only take medicines as directed by your health care provider. SEEK MEDICAL CARE IF:   You have abdominal  pain.  You have bloating.  You have cramps.  You have not gone to the bathroom in 3 days. SEEK IMMEDIATE MEDICAL CARE IF:   Your pain gets worse.  Yourbloating becomes very bad.  You have a fever or chills, and your symptoms suddenly get worse.  You begin vomiting.  You have bowel movements that are bloody or black. MAKE SURE YOU:  Understand these instructions.  Will watch your condition.  Will get help right away if you are not doing well or get worse. Document Released: 06/29/2004 Document Revised: 10/07/2013 Document Reviewed: 08/27/2013 [facility] Patient Information 2015 [place], [place]. This information is not intended to replace advice given to you by your health care provider. Make sure you discuss any questions you have with your health care provider.  Colonoscopy Discharge Instructions  Read the instructions outlined below and refer to this sheet in the next few weeks. These discharge instructions provide you with general information on caring for yourself after you leave the hospital. Your doctor may also give you specific instructions. While your treatment has been planned according to the most current medical practices available, unavoidable complications occasionally occur. If you have any problems or questions after discharge, call Dr. Gala Romney at [phone]. ACTIVITY  You may resume your regular activity, but move at a slower pace for the next 24 hours.   Take frequent rest periods for the next 24 hours.   Walking will help get rid of the air and reduce the bloated feeling in your belly (abdomen).   No driving for 24 hours (because of the medicine (anesthesia) used during the test).    Do not sign any important legal  documents or operate any machinery for 24 hours (because of the anesthesia used during the test).  NUTRITION  Drink plenty of fluids.   You may resume your normal diet as instructed by your doctor.   Begin with a light meal and progress to your  normal diet. Heavy or fried foods are harder to digest and may make you feel sick to your stomach (nauseated).   Avoid alcoholic beverages for 24 hours or as instructed.  MEDICATIONS  You may resume your normal medications unless your doctor tells you otherwise.  WHAT YOU CAN EXPECT TODAY  Some feelings of bloating in the abdomen.   Passage of more gas than usual.   Spotting of blood in your stool or on the toilet paper.  IF YOU HAD POLYPS REMOVED DURING THE COLONOSCOPY:  No aspirin products for 7 days or as instructed.   No alcohol for 7 days or as instructed.   Eat a soft diet for the next 24 hours.  FINDING OUT THE RESULTS OF YOUR TEST Not all test results are available during your visit. If your test results are not back during the visit, make an appointment with your caregiver to find out the results. Do not assume everything is normal if you have not heard from your caregiver or the medical facility. It is important for you to follow up on all of your test results.  SEEK IMMEDIATE MEDICAL ATTENTION IF:  You have more than a spotting of blood in your stool.   Your belly is swollen (abdominal distention).   You are nauseated or vomiting.   You have a temperature over 101.   You have abdominal pain or discomfort that is severe or gets worse throughout the day.   Diverticulosis information provided  Repeat screening colonoscopy in 10 years

## 2015-06-09 NOTE — H&P (Signed)
@LOGO @   Primary Care Physician:  Mickie Hillier, MD Primary Gastroenterologist:  Dr. Gala Romney  Pre-Procedure History & Physical: HPI:  William [name] is a 49 y.o. male is here for a screening colonoscopy.  Last colonoscopy 2006-diverticulosis; done for Hemoccult-positive stool. No bowel symptoms currently. Status post sigmoid resection for diverticulitis-last year. Doing well at this time.  Sister with UC.  Father had benign "tumor" removed from his colon. Patient denies family history of colon cancer in any first-degree relatives.  Past Medical History  Diagnosis Date  . Hyperlipidemia   . Reflux   . ED (erectile dysfunction)   . Prostatitis   . Pancreatitis, acute   . Diverticulosis   . PONV (postoperative nausea and vomiting)   . Diverticulitis of intestine with perforation and abscess   . GERD (gastroesophageal reflux disease)   . Dysuria   . Difficulty sleeping     TAKES ADVIL PM    Past Surgical History  Procedure Laterality Date  . Knee surgery      Both  . Cholecystectomy    . Appendectomy    . Hernia repair    . Tonsillectomy    . Sphincterotomy      for sphincter of Oddi dysfunction  . Abcess drainage      ABDOMINAL  . Laparoscopic sigmoid colectomy N/A 11/17/2013    Procedure: LAPAROSCOPIC SIGMOID COLECTOMY rigid proctoscopy;  Surgeon: Edward Jolly, MD;  Location: [facility];  Service: General;  Laterality: N/A;  . Shoulder surgery Left May 2016    Prior to Admission medications   Medication Sig Start Date End Date Taking? Authorizing Provider  cyclobenzaprine (FLEXERIL) 10 MG tablet Take 10 mg by mouth 3 (three) times daily as needed for muscle spasms.   Yes Historical Provider, MD  lovastatin (MEVACOR) 40 MG tablet Take 1 tablet (40 mg total) by mouth at bedtime. 05/17/15  Yes Mikey Kirschner, MD  Multiple Vitamins-Minerals (ALIVE MENS ENERGY PO) Take 1 tablet by mouth daily.   Yes Historical Provider, MD  omeprazole (PRILOSEC) 40 MG capsule TAKE 1 CAPSULE  DAILY 04/07/15  Yes Mikey Kirschner, MD  peg 3350 powder (MOVIPREP) 100 G SOLR Take 1 kit (200 g total) by mouth as directed. 05/31/15  Yes Daneil Dolin, MD  predniSONE (DELTASONE) 10 MG tablet Take 10 mg by mouth daily with breakfast. Taper pack. Started on 8/11 for 6 days   Yes Historical Provider, MD  traZODone (DESYREL) 50 MG tablet Take one to two tablets qhs prn sleep 05/17/15  Yes Mikey Kirschner, MD    Allergies as of 05/18/2015 - Review Complete 05/18/2015  Allergen Reaction Noted  . Biaxin [clarithromycin]  03/16/2015    Family History  Problem Relation Age of Onset  . Cancer Father     Colon  . Cancer Other   . Diverticulitis Other     Social History   Social History  . Marital Status: Married    Spouse Name: N/A  . Number of Children: N/A  . Years of Education: N/A   Occupational History  . Not on file.   Social History Main Topics  . Smoking status: Former Smoker    Quit date: 11/10/1986  . Smokeless tobacco: Never Used  . Alcohol Use: 0.6 oz/week    1 Glasses of wine per week     Comment: occ  . Drug Use: No  . Sexual Activity: Not on file   Other Topics Concern  . Not on file   Social  History Narrative    Review of Systems: See HPI, otherwise negative ROS  Physical Exam: BP 122/84 mmHg  Pulse 68  Temp(Src) 98.3 F (36.8 C) (Oral)  Resp 12  Ht 6' 1"  (1.854 m)  Wt 205 lb (92.987 kg)  BMI 27.05 kg/m2  SpO2 100% General:   Alert,  Well-developed, well-nourished, pleasant and cooperative in NAD Head:  Normocephalic and atraumatic. Neck:  Supple; no masses or thyromegaly. Lungs:  Clear throughout to auscultation.   No wheezes, crackles, or rhonchi. No acute distress. Heart:  Regular rate and rhythm; no murmurs, clicks, rubs,  or gallops. Abdomen:  Surgical scar right lower quadrant-well healed. Nondistended. Positive bowel sounds. Soft and nontender without appreciable mass or organomegaly   Msk:  Symmetrical without gross deformities. Normal  posture. Pulses:  Normal pulses noted. Extremities:  Without clubbing or edema.  Impression/Plan: William [name] is now here to undergo a screening colonoscopy. This will be an average risk screening examination. Risks, benefits, limitations, imponderables and alternatives regarding colonoscopy have been reviewed with the patient. Questions have been answered. All parties agreeable.     Notice:  This dictation was prepared with Dragon dictation along with smaller phrase technology. Any transcriptional errors that result from this process are unintentional and may not be corrected upon review.

## 2015-06-09 NOTE — Op Note (Signed)
[facility] [address] [place], [address]   COLONOSCOPY PROCEDURE REPORT  PATIENT: William, [name]  MR#: [id number] BIRTHDATE: [birth year] , 49  yrs. old GENDER: male ENDOSCOPIST: R.  Garfield Cornea, MD FACP [facility] REFERRED [id number] Wolfgang Phoenix, M.D.  Excell Seltzer, M.D. PROCEDURE DATE:  Jul 07, 2015 PROCEDURE:   Colonoscopy, screening INDICATIONS:Average risk colorectal cancer screening examination. MEDICATIONS: Versed 7 mg IV and Demerol 150 mg IV in divided doses. Zofran 4 mg IV. ASA CLASS:       Class II  CONSENT: The risks, benefits, alternatives and imponderables including but not limited to bleeding, perforation as well as the possibility of a missed lesion have been reviewed.  The potential for biopsy, lesion removal, etc. have also been discussed. Questions have been answered.  All parties agreeable.  Please see the history and physical in the medical record for more information.  DESCRIPTION OF PROCEDURE:   After the risks benefits and alternatives of the procedure were thoroughly explained, informed consent was obtained.  The digital rectal exam revealed no abnormalities of the rectum.   The EC-3890Li ([id number])  endoscope was introduced through the anus and advanced to the terminal ileum which was intubated for a short distance. No adverse events experienced.   The quality of the prep was adequate  The instrument was then slowly withdrawn as the colon was fully examined. Estimated blood loss is zero unless otherwise noted in this procedure report.      COLON FINDINGS: Normal-appearing rectal mucosa.  Subtle, well-healed surgical anastomosis identified at approximately 20 cm from the anal verge.  Patient had a couple of small mouth distal colonic diverticula; the remainder the colonic mucosa appeared normal.  The distal 10 cm of terminal ileal mucosa also appeared normal. Retroflexion was performed. .  Withdrawal time=7 minutes 0 seconds.  The  scope was withdrawn and the procedure completed. COMPLICATIONS: There were no immediate complications.  ENDOSCOPIC IMPRESSION: Colonic diverticulosis (mild).  Status post segmental resection.  RECOMMENDATIONS: Repeat screening colonoscopy in 10 years.  eSigned:  R. Garfield Cornea, MD Rosalita Chessman [facility] 07-07-2015 12:01 PM   cc:  CPT CODES: ICD CODES:  The ICD and CPT codes recommended by this software are interpretations from the data that the clinical staff has captured with the software.  The verification of the translation of this report to the ICD and CPT codes and modifiers is the sole responsibility of the health care institution and practicing physician where this report was generated.  [name] will not be held responsible for the validity of the ICD and CPT codes included on this report.  AMA assumes no liability for data contained or not contained herein. CPT is a Designer, television/film set of the [employer].  PATIENT NAME:  William, [name] MR#: [id number]

## 2015-06-14 ENCOUNTER — Telehealth: Payer: Self-pay | Admitting: *Deleted

## 2015-06-14 ENCOUNTER — Encounter: Payer: Self-pay | Admitting: Family Medicine

## 2015-06-14 ENCOUNTER — Ambulatory Visit (INDEPENDENT_AMBULATORY_CARE_PROVIDER_SITE_OTHER): Payer: BLUE CROSS/BLUE SHIELD | Admitting: Family Medicine

## 2015-06-14 VITALS — BP 132/90 | Temp 98.6°F | Ht 73.0 in | Wt 209.0 lb

## 2015-06-14 DIAGNOSIS — R109 Unspecified abdominal pain: Secondary | ICD-10-CM

## 2015-06-14 LAB — CBC WITH DIFFERENTIAL/PLATELET
BASOS ABS: 0 10*3/uL (ref 0.0–0.2)
Basos: 0 %
EOS (ABSOLUTE): 0.1 10*3/uL (ref 0.0–0.4)
EOS: 2 %
Hematocrit: 46.1 % (ref 37.5–51.0)
Hemoglobin: 16.3 g/dL (ref 12.6–17.7)
LYMPHS ABS: 1.6 10*3/uL (ref 0.7–3.1)
Lymphs: 33 %
MCH: 30 pg (ref 26.6–33.0)
MCHC: 35.4 g/dL (ref 31.5–35.7)
MCV: 85 fL (ref 79–97)
MONOS ABS: 0.4 10*3/uL (ref 0.1–0.9)
Monocytes: 8 %
NEUTROS ABS: 2.7 10*3/uL (ref 1.4–7.0)
Neutrophils: 57 %
PLATELETS: 220 10*3/uL (ref 150–379)
RBC: 5.43 x10E6/uL (ref 4.14–5.80)
RDW: 14.7 % (ref 12.3–15.4)
WBC: 4.9 10*3/uL (ref 3.4–10.8)

## 2015-06-14 LAB — BASIC METABOLIC PANEL
BUN / CREAT RATIO: 19 (ref 9–20)
BUN: 17 mg/dL (ref 6–24)
CHLORIDE: 99 mmol/L (ref 97–108)
CO2: 28 mmol/L (ref 18–29)
Calcium: 9.9 mg/dL (ref 8.7–10.2)
Creatinine, Ser: 0.89 mg/dL (ref 0.76–1.27)
GFR calc non Af Amer: 99 mL/min/{1.73_m2} (ref >59)
GFR, EST AFRICAN AMERICAN: 114 mL/min/{1.73_m2} (ref >59)
GLUCOSE: 104 mg/dL — AB (ref 65–99)
POTASSIUM: 4.3 mmol/L (ref 3.5–5.2)
Sodium: 138 mmol/L (ref 134–144)

## 2015-06-14 NOTE — Progress Notes (Signed)
   Subjective:    Patient ID: William [name], male    DOB: 1966-06-24, 49 y.o.   MRN: [id number]  Abdominal Pain This is a new problem. Episode onset: 4 days ago.  The quality of the pain is dull. Associated symptoms include headaches. Associated symptoms comments: bloating. Treatments tried: advil. The treatment provided moderate relief.    pain started after having colonoscopy last Wednesday. Pain mostly on left side. Pain on right side yesterday.   Sore since the coonoscopy  No bm for a few days  Not as usual re with bm's as usual  Still bloated but better, eeems to be worse post eating,  Still having dull ache  In rlq abdomen    Headache off and on  Since day of colonoscopy.  No appetite yesterday or today. Feels worse after eating. First bm after colonoscopy was yesterday am.    Review of Systems  Gastrointestinal: Positive for abdominal pain.  Neurological: Positive for headaches.   No vomiting no true fever sore worse with motion diffuse low abdomen    Objective:   Physical Exam  Alert mild malaise. Vitals stable. Lungs clear. Heart regular in rhythm. Abdomen hyperactive bowel sounds. Diffuse low abdominal tenderness no rebound no guarding testicular exam normal      Assessment & Plan:  Impression post colonoscopy abdominal pain. None of the cardinal features usually suggestive of frank infection or perforation discussed plan we'll do blood work. If benign will give this a few more days. Discussed at length addendum blood work returned normal WSL

## 2015-06-14 NOTE — Telephone Encounter (Signed)
Called pt and discussed bw results per dr Richardson Landry. bw looks good. Discomfort should get better over the next few days. Call back if worse or not better. Pt verbalized understanding.

## 2015-06-16 ENCOUNTER — Encounter: Payer: Self-pay | Admitting: Family Medicine

## 2015-06-17 ENCOUNTER — Other Ambulatory Visit: Payer: Self-pay | Admitting: *Deleted

## 2015-06-17 MED ORDER — OMEPRAZOLE 40 MG PO CPDR: DELAYED_RELEASE_CAPSULE | ORAL | Status: DC

## 2015-07-06 ENCOUNTER — Other Ambulatory Visit: Payer: Self-pay | Admitting: *Deleted

## 2015-07-06 ENCOUNTER — Telehealth: Payer: Self-pay | Admitting: *Deleted

## 2015-07-06 MED ORDER — TRAZODONE HCL 50 MG PO TABS: ORAL_TABLET | ORAL | Status: DC

## 2015-07-06 NOTE — Telephone Encounter (Signed)
Fax from [place] requesting 90 day supply on trazodone 50mg . Pt last seen 06/14/15

## 2015-07-06 NOTE — Telephone Encounter (Signed)
Med sent to pharm for 90 day supply 

## 2015-07-06 NOTE — Telephone Encounter (Signed)
Ok plus one ref 

## 2015-07-18 ENCOUNTER — Emergency Department
Admission: EM | Admit: 2015-07-18 | Discharge: 2015-07-18 | Disposition: A | Discharge status: Home / Self Care | Payer: BLUE CROSS/BLUE SHIELD | Attending: Emergency Medicine | Admitting: Emergency Medicine

## 2015-07-18 ENCOUNTER — Encounter: Payer: Self-pay | Admitting: Emergency Medicine

## 2015-07-18 ENCOUNTER — Emergency Department: Payer: BLUE CROSS/BLUE SHIELD

## 2015-07-18 DIAGNOSIS — R202 Paresthesia of skin: Secondary | ICD-10-CM | POA: Diagnosis present

## 2015-07-18 DIAGNOSIS — Z79899 Other long term (current) drug therapy: Secondary | ICD-10-CM | POA: Insufficient documentation

## 2015-07-18 DIAGNOSIS — Z87891 Personal history of nicotine dependence: Secondary | ICD-10-CM | POA: Diagnosis not present

## 2015-07-18 DIAGNOSIS — I160 Hypertensive urgency: Secondary | ICD-10-CM

## 2015-07-18 LAB — COMPREHENSIVE METABOLIC PANEL
ALT: 160 U/L — AB (ref 17–63)
AST: 65 U/L — AB (ref 15–41)
Albumin: 5.5 g/dL — ABNORMAL HIGH (ref 3.5–5.0)
Alkaline Phosphatase: 82 U/L (ref 38–126)
Anion gap: 14 (ref 5–15)
BUN: 17 mg/dL (ref 6–20)
CHLORIDE: 99 mmol/L — AB (ref 101–111)
CO2: 26 mmol/L (ref 22–32)
CREATININE: 0.85 mg/dL (ref 0.61–1.24)
Calcium: 10.2 mg/dL (ref 8.9–10.3)
GFR calc Af Amer: >60 mL/min (ref >60)
GFR calc non Af Amer: >60 mL/min (ref >60)
GLUCOSE: 100 mg/dL — AB (ref 65–99)
Potassium: 3.7 mmol/L (ref 3.5–5.1)
SODIUM: 139 mmol/L (ref 135–145)
Total Bilirubin: 0.5 mg/dL (ref 0.3–1.2)
Total Protein: 8.5 g/dL — ABNORMAL HIGH (ref 6.5–8.1)

## 2015-07-18 LAB — CBC
HCT: 49.4 % (ref 40.0–52.0)
Hemoglobin: 16.6 g/dL (ref 13.0–18.0)
MCH: 29 pg (ref 26.0–34.0)
MCHC: 33.6 g/dL (ref 32.0–36.0)
MCV: 86.3 fL (ref 80.0–100.0)
PLATELETS: 234 10*3/uL (ref 150–440)
RBC: 5.72 MIL/uL (ref 4.40–5.90)
RDW: 14.3 % (ref 11.5–14.5)
WBC: 8.9 10*3/uL (ref 3.8–10.6)

## 2015-07-18 LAB — DIFFERENTIAL
BASOS PCT: 1 %
Basophils Absolute: 0 10*3/uL (ref 0–0.1)
EOS ABS: 0.3 10*3/uL (ref 0–0.7)
EOS PCT: 4 %
LYMPHS ABS: 1.8 10*3/uL (ref 1.0–3.6)
Lymphocytes Relative: 21 %
Monocytes Absolute: 0.6 10*3/uL (ref 0.2–1.0)
Monocytes Relative: 7 %
NEUTROS PCT: 67 %
Neutro Abs: 6.1 10*3/uL (ref 1.4–6.5)

## 2015-07-18 LAB — TROPONIN I: Troponin I: <0.03 ng/mL (ref <0.031)

## 2015-07-18 MED ORDER — LORAZEPAM 1 MG PO TABS
1.0000 mg | ORAL_TABLET | Freq: Once | ORAL | Status: AC
  Administered 2015-07-18: 1 mg via ORAL
  Filled 2015-07-18: qty 1

## 2015-07-18 MED ORDER — CLONIDINE HCL 0.1 MG PO TABS: 0.1000 mg | ORAL_TABLET | Freq: Two times a day (BID) | ORAL | Status: DC | PRN

## 2015-07-18 MED ORDER — CLONIDINE HCL 0.1 MG PO TABS
0.2000 mg | ORAL_TABLET | Freq: Once | ORAL | Status: DC
  Filled 2015-07-18: qty 2

## 2015-07-18 MED ORDER — LORAZEPAM 1 MG PO TABS: 1.0000 mg | ORAL_TABLET | Freq: Three times a day (TID) | ORAL | Status: DC | PRN

## 2015-07-18 NOTE — ED Notes (Signed)
Pt denies tingling numbness currently. Pt states "i feel better, i'm just trying to relax."

## 2015-07-18 NOTE — ED Notes (Signed)
Pt up to restroom to void with staff assist.

## 2015-07-18 NOTE — ED Notes (Signed)
Dr. Junita Push notified pt's blood pressure currently 135/91. Clonidine held at this time.

## 2015-07-18 NOTE — ED Provider Notes (Signed)
Time Seen: Approximately ----------------------------------------- 8:25 PM on 07/18/2015 -----------------------------------------   I have reviewed the triage notes  Chief Complaint: Tingling   History of Present Illness: William [name] is a 49 y.o. male who presents with acute onset of some "" a seeing "" feeling and some numbness and tingling in his right arm and right leg. He states the tingling seemed to be in the right side of his face. He denies any focal weakness or trouble with speech or swallowing. He states he is feeling better now and only has some numbness in the right hand. He denies any significant headache, trouble with speech, trouble with swallowing, nausea, vomiting. Eyes any neck pain or chest pain or shortness of breath. His wife did not see any facial weakness. His wife did check his blood pressure at home and noticed systolic pressure greater than 034 and I'll diastolic pressure greater than 100. Patient does have a remote history of hypertension but has not been on any medication recently for hypertension. He did have a left laparoscopic surgery of his right shoulder approximately a week ago.   Past Medical History  Diagnosis Date  . Hyperlipidemia   . Reflux   . ED (erectile dysfunction)   . Prostatitis   . Pancreatitis, acute   . Diverticulosis   . PONV (postoperative nausea and vomiting)   . Diverticulitis of intestine with perforation and abscess   . GERD (gastroesophageal reflux disease)   . Dysuria   . Difficulty sleeping     TAKES ADVIL PM    Patient Active Problem List   Diagnosis Date Noted  . Special screening for malignant neoplasms, colon   . Insomnia 05/18/2015  . Hyperlipidemia LDL goal <130 12/08/2014  . Postoperative abdominal pain 11/22/2013  . Diverticulitis of sigmoid colon 11/17/2013  . Intra-abdominal abscess ([place]) 10/08/2013  . Diverticulitis with perforation 10/08/2013  . Diverticulitis of colon with perforation 09/22/2013  .  Other and unspecified hyperlipidemia 01/09/2013  . Gastro-esophageal reflux 01/09/2013  . Epididymitis 01/09/2013    Past Surgical History  Procedure Laterality Date  . Knee surgery      Both  . Cholecystectomy    . Appendectomy    . Hernia repair    . Tonsillectomy    . Sphincterotomy      for sphincter of Oddi dysfunction  . Abcess drainage      ABDOMINAL  . Laparoscopic sigmoid colectomy N/A 11/17/2013    Procedure: LAPAROSCOPIC SIGMOID COLECTOMY rigid proctoscopy;  Surgeon: Edward Jolly, MD;  Location: [facility];  Service: General;  Laterality: N/A;  . Shoulder surgery Left May 2016  . Colonoscopy N/A 06/09/2015    Procedure: COLONOSCOPY;  Surgeon: Daneil Dolin, MD;  Location: [facility];  Service: Endoscopy;  Laterality: N/A;  11:30 Am    Past Surgical History  Procedure Laterality Date  . Knee surgery      Both  . Cholecystectomy    . Appendectomy    . Hernia repair    . Tonsillectomy    . Sphincterotomy      for sphincter of Oddi dysfunction  . Abcess drainage      ABDOMINAL  . Laparoscopic sigmoid colectomy N/A 11/17/2013    Procedure: LAPAROSCOPIC SIGMOID COLECTOMY rigid proctoscopy;  Surgeon: Edward Jolly, MD;  Location: [facility];  Service: General;  Laterality: N/A;  . Shoulder surgery Left May 2016  . Colonoscopy N/A 06/09/2015    Procedure: COLONOSCOPY;  Surgeon: Daneil Dolin, MD;  Location: [facility];  Service: Endoscopy;  Laterality: N/A;  11:30 Am    Current Outpatient Rx  Name  Route  Sig  Dispense  Refill  . lovastatin (MEVACOR) 40 MG tablet   Oral   Take 1 tablet (40 mg total) by mouth at bedtime.   90 tablet   1   . Multiple Vitamins-Minerals (ALIVE MENS ENERGY PO)   Oral   Take 1 tablet by mouth daily.         Marland Kitchen omeprazole (PRILOSEC) 40 MG capsule      TAKE 1 CAPSULE DAILY   90 capsule   1   . traZODone (DESYREL) 50 MG tablet      Take one to two tablets qhs prn sleep   180 tablet   1     Allergies:  Biaxin and  Other  Family History: Family History  Problem Relation Age of Onset  . Cancer Father     Colon  . Cancer Other   . Diverticulitis Other     Social History: Social History  Substance Use Topics  . Smoking status: Former Smoker    Quit date: 11/10/1986  . Smokeless tobacco: Never Used  . Alcohol Use: 0.6 oz/week    1 Glasses of wine per week     Comment: occ     Review of Systems:   10 point review of systems was performed and was otherwise negative:  Constitutional: No fever Eyes: No visual disturbances ENT: No sore throat, ear pain Cardiac: No chest pain Respiratory: No shortness of breath, wheezing, or stridor Abdomen: No abdominal pain, no vomiting, No diarrhea Endocrine: No weight loss, No night sweats Extremities: No peripheral edema, cyanosis Skin: No rashes, easy bruising Neurologic: No focal weakness, trouble with speech or swollowing Urologic: No dysuria, Hematuria, or urinary frequency   Physical Exam:  ED Triage Vitals  Enc Vitals Group     BP 07/18/15 1509 179/100 mmHg     Pulse Rate 07/18/15 1509 70     Resp 07/18/15 1509 18     Temp 07/18/15 1509 97.5 F (36.4 C)     Temp Source 07/18/15 1509 Oral     SpO2 07/18/15 1509 98 %     Weight 07/18/15 1509 207 lb (93.895 kg)     Height 07/18/15 1509 6\' 1"  (1.854 m)     Head Cir --      Peak Flow --      Pain Score 07/18/15 1510 0     Pain Loc --      Pain Edu? --      Excl. in [place]? --     General: Awake , Alert , and Oriented times 3; GCS 15 Head: Normal cephalic , atraumatic Eyes: Pupils equal , round, reactive to light Nose/Throat: No nasal drainage, patent upper airway without erythema or exudate.  Neck: Supple, Full range of motion, No anterior adenopathy or palpable thyroid masses Lungs: Clear to ascultation without wheezes , rhonchi, or rales Heart: Regular rate, regular rhythm without murmurs , gallops , or rubs Abdomen: Soft, non tender without rebound, guarding , or rigidity; bowel  sounds positive and symmetric in all 4 quadrants. No organomegaly .        Extremities: 2 plus symmetric pulses. No edema, clubbing or cyanosis Neurologic: normal ambulation, Motor symmetric without deficits, sensory intact Skin: warm, dry, no rashes   Labs:   All laboratory work was reviewed including any pertinent negatives or positives listed below:  Labs Reviewed  COMPREHENSIVE METABOLIC PANEL - Abnormal; Notable for the following:    Chloride 99 (*)    Glucose, Bld 100 (*)    Total Protein 8.5 (*)    Albumin 5.5 (*)    AST 65 (*)    ALT 160 (*)    All other components within normal limits  CBC  DIFFERENTIAL  TROPONIN I  CBG MONITORING, ED    EKG: ED ECG REPORT I, Daymon Larsen, the attending physician, personally viewed and interpreted this ECG.  Date: 07/18/2015 EKG Time: 1521 Rate: 99 Rhythm: normal sinus rhythm QRS Axis: normal Intervals: normal ST/T Wave abnormalities: normal Conduction Disutrbances: none Narrative Interpretation: unremarkable   EXAM: CT HEAD WITHOUT CONTRAST  TECHNIQUE: Contiguous axial images were obtained from the base of the skull through the vertex without intravenous contrast.  COMPARISON: None.  FINDINGS: The ventricles are normal in size and configuration. There is either a prominent perivascular space or small arachnoid cyst in the posterior medial right frontal lobe at the level of the suprasellar cistern. There is no mass effect on this study. No hemorrhage, extra-axial fluid, or midline shift. Gray-white compartments are otherwise normal. No acute infarct apparent. The bony calvarium appears intact. The mastoid air cells are clear.  IMPRESSION: Probable perivascular space prominence in the posterior medial right frontal lobe. Elsewhere gray-white compartments appear normal. No acute infarct. No hemorrhage or midline shift. No edema appreciable.  Radiology:    EXAM: CT HEAD WITHOUT  CONTRAST  TECHNIQUE: Contiguous axial images were obtained from the base of the skull through the vertex without intravenous contrast.  COMPARISON: None.  FINDINGS: The ventricles are normal in size and configuration. There is either a prominent perivascular space or small arachnoid cyst in the posterior medial right frontal lobe at the level of the suprasellar cistern. There is no mass effect on this study. No hemorrhage, extra-axial fluid, or midline shift. Gray-white compartments are otherwise normal. No acute infarct apparent. The bony calvarium appears intact. The mastoid air cells are clear.  IMPRESSION: Probable perivascular space prominence in the posterior medial right frontal lobe. Elsewhere gray-white compartments appear normal. No acute infarct. No hemorrhage or midline shift. No edema appreciable.   I personally reviewed the radiologic studies    ED Course:  The patient had orders for Ativan along with clonidine. The clonidine was held as the patient's blood pressure seemed to decrease along with his numbness and his symptoms resolved on her own notable for decrease on his blood pressure likely due to relieving his anxiety. Patient's been advised to contact his primary physician in the morning for further outpatient follow-up. He was prescribed Ativan along with clonidine as needed. He'll monitor his blood pressure at home through the rest of the evening. He's been advised to return here if he has any focal weakness, headache, chest pain, or any other new concerns.   Assessment:  Hypertensive urgency  Final Clinical Impression:  Hypertensive urgency Final diagnoses:  None     Plan:  Outpatient management Patient was advised to return immediately if condition worsens. Patient was advised to follow up with her primary care physician or other specialized physicians involved and in their current assessment.             Daymon Larsen, MD 07/18/15  [id number]

## 2015-07-18 NOTE — Discharge Instructions (Signed)

## 2015-07-18 NOTE — ED Notes (Signed)
Pt states he began feeling "spacey" and having tingling in his right arm, bp taken at home was elevated. Pt now states he is tingling on the whole right side of his body at this time. Had shoulder surgery on Wed, began taking Phentermine yesterday and again today. Pt anxious, states "something just isn't right on my right side." Speech clear, grips equal and strong.

## 2015-07-18 NOTE — ED Notes (Signed)
Pt states was at [place] around noon when he began to have right sided facial, arm and leg tingling. Pt states "the entire side of my right body was tingling." pt states began to experience a headache and felt like his blood pressure was high. Pt arrives to ed room very anxious. Skin slightly flushed, warm and dry. Pt states "it seemed like it lasted until about two hours into my stay here." pt with recent right shoulder arthroscopy "for bone spurs and arthritis." pt with healing surgical incisions to3 to right anterior shoulder. One is approx 1/4 inch to medial top of shoulder, one approx 1/8 inch to right lateral anterior shoulder and one approx 1/8 inch to  medial anterior shoulder.

## 2015-07-19 ENCOUNTER — Encounter: Payer: Self-pay | Admitting: Family Medicine

## 2015-07-19 ENCOUNTER — Ambulatory Visit (INDEPENDENT_AMBULATORY_CARE_PROVIDER_SITE_OTHER): Payer: BLUE CROSS/BLUE SHIELD | Admitting: Family Medicine

## 2015-07-19 VITALS — BP 130/90 | Ht 73.0 in | Wt 204.5 lb

## 2015-07-19 DIAGNOSIS — F32A Depression, unspecified: Secondary | ICD-10-CM

## 2015-07-19 DIAGNOSIS — F411 Generalized anxiety disorder: Secondary | ICD-10-CM

## 2015-07-19 DIAGNOSIS — G4700 Insomnia, unspecified: Secondary | ICD-10-CM | POA: Diagnosis not present

## 2015-07-19 DIAGNOSIS — Z23 Encounter for immunization: Secondary | ICD-10-CM | POA: Diagnosis not present

## 2015-07-19 DIAGNOSIS — M25512 Pain in left shoulder: Secondary | ICD-10-CM | POA: Diagnosis not present

## 2015-07-19 DIAGNOSIS — M545 Low back pain: Secondary | ICD-10-CM

## 2015-07-19 DIAGNOSIS — F329 Major depressive disorder, single episode, unspecified: Secondary | ICD-10-CM | POA: Diagnosis not present

## 2015-07-19 MED ORDER — ESCITALOPRAM OXALATE 10 MG PO TABS: 10.0000 mg | ORAL_TABLET | Freq: Every day | ORAL | Status: DC

## 2015-07-19 MED ORDER — LISINOPRIL 5 MG PO TABS: 5.0000 mg | ORAL_TABLET | Freq: Every day | ORAL | Status: DC

## 2015-07-19 MED ORDER — LORAZEPAM 1 MG PO TABS: 1.0000 mg | ORAL_TABLET | Freq: Every day | ORAL | Status: DC | PRN

## 2015-07-19 NOTE — Progress Notes (Signed)
   Subjective:    Patient ID: William [name], male    DOB: 01-30-1966, 49 y.o.   MRN: [id number] Patient arrives office with very complicated and multiple concerns.   HPI Patient is here today for a ER follow up visit. Patient was seen at [facility] on 07/18/15 for hypertensive urgency. Patient states that he is feeling better but not 100%. Patient complains of headache.  Took meds on empty stomach, went to church   Took a percocet for the shoulder, took with enmpty stomach plsu coffee  Feeling woozy,sig reflux and heartburn,  Sat and took it easy and the pb came down  Red and freq urin ation    Patient states that he wants to discuss managing his stress and focusing better.   Felt bad and anxious. No suicidal thoughts no homicidal thoughts. Some stress within the family. Feeling down depressed. Notes the trazodone did not help.  189 over 85. 201 over 110  Numb and tingling on one side right side, on further history right arm had a lot of tingling and numbness after recent shoulder block for shoulder surgery.  Percocet caused itching, took benadryl and perc and omep and other meds on empty sotomach   States definitely needs something else at night to take for sleep. Also to take intermittently for anxiety.  Complete emergency room report reviewed in presence of patient. Had negative scan with assessment.   Patient is still having abdominal pain after his colonoscopy.               Review of Systems No chest pain less headache no change in bowel habits some diminished energy no rash complete review systems otherwise negative    Objective:   Physical Exam Alert vitals stable blood pressure 160/90 on repeat anxious appearing HEENT normal neck supple lungs clear. Heart regular in rhythm. Abdomen benign. Ankles without edema.       Assessment & Plan:  Impression 1 hypertension reemergence of elevated numbers discuss with need to resume medicine #2 lateral paresthesia associated with  hypertensive urgency. Patient was worried he had stroke. On further history likely not and likely residual symptoms from arm block #3 progressive insomnia patient concerned about this #4 anxiety with depression. Plan easily 40 minutes spent with patient. Numerous questions answered regarding each of these concerns. Initiate Lexapro side effects benefits discussed. Initiate Ativan daily at bedtime. Initiate Zestril. Diet exercise discussed. Stress reduction discussed. Follow-up is scheduled. WSL

## 2015-07-22 ENCOUNTER — Encounter: Payer: Self-pay | Admitting: Family Medicine

## 2015-07-22 DIAGNOSIS — F32A Depression, unspecified: Secondary | ICD-10-CM | POA: Insufficient documentation

## 2015-07-22 DIAGNOSIS — F411 Generalized anxiety disorder: Secondary | ICD-10-CM | POA: Insufficient documentation

## 2015-07-22 DIAGNOSIS — F329 Major depressive disorder, single episode, unspecified: Secondary | ICD-10-CM | POA: Insufficient documentation

## 2015-07-23 ENCOUNTER — Ambulatory Visit (INDEPENDENT_AMBULATORY_CARE_PROVIDER_SITE_OTHER): Payer: BLUE CROSS/BLUE SHIELD | Admitting: Family Medicine

## 2015-07-23 ENCOUNTER — Encounter: Payer: Self-pay | Admitting: Family Medicine

## 2015-07-23 VITALS — BP 102/80 | Temp 98.3°F | Ht 73.0 in | Wt 211.0 lb

## 2015-07-23 DIAGNOSIS — K5792 Diverticulitis of intestine, part unspecified, without perforation or abscess without bleeding: Secondary | ICD-10-CM

## 2015-07-23 MED ORDER — METRONIDAZOLE 500 MG PO TABS: 500.0000 mg | ORAL_TABLET | Freq: Three times a day (TID) | ORAL | Status: DC

## 2015-07-23 MED ORDER — CIPROFLOXACIN HCL 500 MG PO TABS: 500.0000 mg | ORAL_TABLET | Freq: Two times a day (BID) | ORAL | Status: AC

## 2015-07-23 NOTE — Telephone Encounter (Signed)
Called pt and schedule appt for this am.

## 2015-07-23 NOTE — Progress Notes (Signed)
   Subjective:    Patient ID: William [name], male    DOB: 08/05/1966, 49 y.o.   MRN: [id number]  Abdominal Pain This is a new problem. The current episode started in the past 7 days. The problem occurs intermittently. The problem has been gradually worsening. The pain is located in the LUQ. The pain is moderate. The quality of the pain is aching. The abdominal pain does not radiate. Associated symptoms include belching. Nothing aggravates the pain. The pain is relieved by nothing. Treatments tried: Advil. The treatment provided no relief.    Pain has worsened and progressed,  BP was up slightly, feels weird since event   No fever or chills  Felt cold  Discomfort not awoken at night times LLQ pain and sever  Bowels non existent   No blood ins stools  Some back aching and discomfort  Have kept working during paind and discomfort, worse with motion  No olimping or gait abnormaoities             Review of Systems  Gastrointestinal: Positive for abdominal pain.   no vomiting positive nausea and diminished energy appetite somewhat diminished no headache no chest pain complete review systems otherwise negative     Objective:   Physical Exam   Alert vitals stable HEENT normal lungs clear heart regular in rhythm no CVA tenderness testicular inguinal exam normal abdomen bowel sounds present distinct left lower quadrant tenderness no rebound no guarding     Assessment & Plan:  Impression probable acute diverticulitis discussed. There is a place for impaired cold medication year. Discussed length with patient. We'll hold off on scanning rationale discussed multiple questions answered 25 minutes spent most in discussion plan will cover with Cipro and metronidazole patient wondered if he might of had a reaction mention is on the past but not sure may been related to actual diverticulitis warning signs discussed [facility]

## 2015-07-28 ENCOUNTER — Encounter: Payer: Self-pay | Admitting: Family Medicine

## 2015-07-29 ENCOUNTER — Encounter: Payer: Self-pay | Admitting: Family Medicine

## 2015-07-29 ENCOUNTER — Ambulatory Visit (HOSPITAL_COMMUNITY)
Admission: RE | Admit: 2015-07-29 | Discharge: 2015-07-29 | Disposition: A | Discharge status: Home / Self Care | Payer: BLUE CROSS/BLUE SHIELD | Source: Ambulatory Visit | Attending: Family Medicine | Admitting: Family Medicine

## 2015-07-29 ENCOUNTER — Other Ambulatory Visit (HOSPITAL_COMMUNITY)
Admission: RE | Admit: 2015-07-29 | Discharge: 2015-07-29 | Disposition: A | Discharge status: Home / Self Care | Payer: BLUE CROSS/BLUE SHIELD | Source: Ambulatory Visit | Attending: Family Medicine | Admitting: Family Medicine

## 2015-07-29 ENCOUNTER — Ambulatory Visit (INDEPENDENT_AMBULATORY_CARE_PROVIDER_SITE_OTHER): Payer: BLUE CROSS/BLUE SHIELD | Admitting: Family Medicine

## 2015-07-29 ENCOUNTER — Other Ambulatory Visit: Payer: Self-pay

## 2015-07-29 VITALS — BP 112/80 | Temp 98.3°F | Ht 73.0 in | Wt 213.1 lb

## 2015-07-29 DIAGNOSIS — Z9889 Other specified postprocedural states: Secondary | ICD-10-CM | POA: Diagnosis not present

## 2015-07-29 DIAGNOSIS — Z9049 Acquired absence of other specified parts of digestive tract: Secondary | ICD-10-CM | POA: Insufficient documentation

## 2015-07-29 DIAGNOSIS — R109 Unspecified abdominal pain: Secondary | ICD-10-CM | POA: Diagnosis present

## 2015-07-29 DIAGNOSIS — K5730 Diverticulosis of large intestine without perforation or abscess without bleeding: Secondary | ICD-10-CM | POA: Insufficient documentation

## 2015-07-29 LAB — CBC WITH DIFFERENTIAL/PLATELET
Basophils Absolute: 0 10*3/uL (ref 0.0–0.1)
Basophils Relative: 1 %
EOS ABS: 0.3 10*3/uL (ref 0.0–0.7)
Eosinophils Relative: 5 %
HEMATOCRIT: 40.3 % (ref 39.0–52.0)
HEMOGLOBIN: 13.9 g/dL (ref 13.0–17.0)
LYMPHS ABS: 1.9 10*3/uL (ref 0.7–4.0)
LYMPHS PCT: 31 %
MCH: 29.9 pg (ref 26.0–34.0)
MCHC: 34.5 g/dL (ref 30.0–36.0)
MCV: 86.7 fL (ref 78.0–100.0)
MONOS PCT: 7 %
Monocytes Absolute: 0.4 10*3/uL (ref 0.1–1.0)
NEUTROS PCT: 56 %
Neutro Abs: 3.5 10*3/uL (ref 1.7–7.7)
Platelets: 223 10*3/uL (ref 150–400)
RBC: 4.65 MIL/uL (ref 4.22–5.81)
RDW: 13.9 % (ref 11.5–15.5)
WBC: 6.2 10*3/uL (ref 4.0–10.5)

## 2015-07-29 LAB — BASIC METABOLIC PANEL
Anion gap: 6 (ref 5–15)
BUN: 23 mg/dL — AB (ref 6–20)
CHLORIDE: 105 mmol/L (ref 101–111)
CO2: 27 mmol/L (ref 22–32)
CREATININE: 0.88 mg/dL (ref 0.61–1.24)
Calcium: 9 mg/dL (ref 8.9–10.3)
GFR calc Af Amer: >60 mL/min (ref >60)
GFR calc non Af Amer: >60 mL/min (ref >60)
GLUCOSE: 95 mg/dL (ref 65–99)
Potassium: 4 mmol/L (ref 3.5–5.1)
SODIUM: 138 mmol/L (ref 135–145)

## 2015-07-29 LAB — HEPATIC FUNCTION PANEL
ALK PHOS: 60 U/L (ref 38–126)
ALT: 56 U/L (ref 17–63)
AST: 29 U/L (ref 15–41)
Albumin: 4.1 g/dL (ref 3.5–5.0)
BILIRUBIN DIRECT: 0.1 mg/dL (ref 0.1–0.5)
BILIRUBIN INDIRECT: 0.5 mg/dL (ref 0.3–0.9)
BILIRUBIN TOTAL: 0.6 mg/dL (ref 0.3–1.2)
Total Protein: 6.8 g/dL (ref 6.5–8.1)

## 2015-07-29 MED ORDER — IOHEXOL 300 MG/ML  SOLN
100.0000 mL | Freq: Once | INTRAMUSCULAR | Status: AC | PRN
  Administered 2015-07-29: 100 mL via INTRAVENOUS

## 2015-07-29 MED ORDER — METRONIDAZOLE 500 MG PO TABS: 500.0000 mg | ORAL_TABLET | Freq: Three times a day (TID) | ORAL | Status: DC

## 2015-07-29 NOTE — Progress Notes (Signed)
   Subjective:    Patient ID: William [name], male    DOB: Jun 19, 1966, 49 y.o.   MRN: [id number]  Abdominal Pain This is a recurrent problem. Associated symptoms include diarrhea. Treatments tried: Flaygl, Cipro. The treatment provided no relief.   Some nausea   Some diarrhea  Still having abd pain and discomfort LLQ pain and tend erness  No sig pain at night   Has contd to work   Persist pain and tenderness  History of diverticulitis. Patient concerned about this. Pain minimally improved despite one week worth of Cipro and Flagyl. Low-grade fever. Ongoing diminished energy and appetite  History of diverticulitis surgery in the past Patient states no other concerns this visit.  Review of Systems  Gastrointestinal: Positive for abdominal pain and diarrhea.   No headache no chest pain    Objective:   Physical Exam Alert vitals stable. Afebrile moderate malaise. Lungs clear. Heart regular in rhythm. No [place] positive left lower quadrant tenderness could be bowel sounds present no rebound no guarding       Assessment & Plan:  Impression history diverticulitis with worsening symptoms plan sent for emergency blood work and emergency CT scan abdomen and pelvis addendum blood work returned good. Scan return with no evident of current diverticulitis patient William [name] finish current antibiotics. If chronic symptoms ensue may need long-term GI referral [facility]

## 2015-09-02 ENCOUNTER — Encounter: Payer: Self-pay | Admitting: Family Medicine

## 2015-09-08 ENCOUNTER — Other Ambulatory Visit: Payer: Self-pay | Admitting: Family Medicine

## 2015-09-08 NOTE — Telephone Encounter (Signed)
incr to fifteen, o k plus two ref

## 2015-09-15 ENCOUNTER — Other Ambulatory Visit: Payer: Self-pay | Admitting: Family Medicine

## 2015-09-15 NOTE — Telephone Encounter (Signed)
May have this and 2 additional refills

## 2015-10-05 ENCOUNTER — Other Ambulatory Visit: Payer: Self-pay

## 2015-10-05 MED ORDER — ESCITALOPRAM OXALATE 10 MG PO TABS: 10.0000 mg | ORAL_TABLET | Freq: Every day | ORAL | Status: DC

## 2015-10-12 ENCOUNTER — Other Ambulatory Visit: Payer: Self-pay | Admitting: *Deleted

## 2015-10-12 MED ORDER — LISINOPRIL 5 MG PO TABS: 5.0000 mg | ORAL_TABLET | Freq: Every day | ORAL | Status: DC

## 2015-10-19 ENCOUNTER — Encounter: Payer: Self-pay | Admitting: Family Medicine

## 2015-10-19 ENCOUNTER — Ambulatory Visit (INDEPENDENT_AMBULATORY_CARE_PROVIDER_SITE_OTHER): Payer: BLUE CROSS/BLUE SHIELD | Admitting: Family Medicine

## 2015-10-19 VITALS — BP 132/86 | Temp 98.3°F | Ht 73.0 in | Wt 223.0 lb

## 2015-10-19 DIAGNOSIS — J019 Acute sinusitis, unspecified: Secondary | ICD-10-CM

## 2015-10-19 DIAGNOSIS — B9689 Other specified bacterial agents as the cause of diseases classified elsewhere: Secondary | ICD-10-CM

## 2015-10-19 MED ORDER — AMOXICILLIN-POT CLAVULANATE 875-125 MG PO TABS: 1.0000 | ORAL_TABLET | Freq: Two times a day (BID) | ORAL | Status: DC

## 2015-10-19 NOTE — Progress Notes (Signed)
   Subjective:    Patient ID: William [name], male    DOB: 1966/08/13, 50 y.o.   MRN: [id number]  Cough This is a new problem. Episode onset: 1 week ago. Associated symptoms include headaches, nasal congestion and rhinorrhea. Pertinent negatives include no chest pain, ear pain, fever or wheezing. Treatments tried: mucinex d, advil, benadryl.    He has had ongoing illness for the past couple weeks worse over the past several days with head congestion drainage coughing sinus pressure. Denies wheezing or difficulty breathing low energy low appetite  Review of Systems  Constitutional: Positive for fatigue. Negative for fever and activity change.  HENT: Positive for congestion and rhinorrhea. Negative for ear pain.   Eyes: Negative for discharge.  Respiratory: Positive for cough. Negative for wheezing.   Cardiovascular: Negative for chest pain.  Neurological: Positive for headaches.   Appetite poor Low energy    Objective:   Physical Exam  Constitutional: He appears well-developed.  HENT:  Head: Normocephalic.  Mouth/Throat: Oropharynx is clear and moist. No oropharyngeal exudate.  Neck: Normal range of motion.  Cardiovascular: Normal rate, regular rhythm and normal heart sounds.   No murmur heard. Pulmonary/Chest: Effort normal and breath sounds normal. He has no wheezes.  Lymphadenopathy:    He has no cervical adenopathy.  Neurological: He exhibits normal muscle tone.  Skin: Skin is warm and dry.  Nursing note and vitals reviewed.         Assessment & Plan:  Patient was seen today for upper respiratory illness. It is felt that the patient is dealing with sinusitis. Antibiotics were prescribed today. Importance of compliance with medication was discussed. Symptoms should gradually resolve over the course of the next several days. If high fevers, progressive illness, difficulty breathing, worsening condition or failure for symptoms to improve over the next several days then the  patient is to follow-up. If any emergent conditions the patient is to follow-up in the emergency department otherwise to follow-up in the office.

## 2015-10-20 ENCOUNTER — Telehealth: Payer: Self-pay | Admitting: Family Medicine

## 2015-10-20 MED ORDER — BENZONATATE 100 MG PO CAPS: 100.0000 mg | ORAL_CAPSULE | Freq: Four times a day (QID) | ORAL | Status: DC | PRN

## 2015-10-20 NOTE — Telephone Encounter (Signed)
Tess perles 100 mg 24 one q six hrs prn cough

## 2015-10-20 NOTE — Telephone Encounter (Signed)
Med sent to pharmacy. Patient notified 

## 2015-10-20 NOTE — Telephone Encounter (Signed)
[facility]   Has tried OTC cough meds, would like something called in for his cough now Will come back to pick it up if he has too

## 2015-10-25 ENCOUNTER — Other Ambulatory Visit: Payer: Self-pay | Admitting: *Deleted

## 2015-10-25 ENCOUNTER — Telehealth: Payer: Self-pay | Admitting: Family Medicine

## 2015-10-25 ENCOUNTER — Encounter: Payer: Self-pay | Admitting: Family Medicine

## 2015-10-25 MED ORDER — BENZONATATE 100 MG PO CAPS: 100.0000 mg | ORAL_CAPSULE | Freq: Four times a day (QID) | ORAL | Status: DC | PRN

## 2015-10-25 MED ORDER — LEVOFLOXACIN 500 MG PO TABS: ORAL_TABLET | ORAL | Status: DC

## 2015-10-25 NOTE — Telephone Encounter (Signed)
Med sent to pharm. Pt notified.  

## 2015-10-25 NOTE — Telephone Encounter (Signed)
Ok plus one ref 

## 2015-10-25 NOTE — Telephone Encounter (Signed)
Patient requesting refill on Tessalon pearles for cough.Patient states he is still coughing but medication seems to help. May we refill?

## 2015-10-31 ENCOUNTER — Other Ambulatory Visit: Payer: Self-pay | Admitting: Family Medicine

## 2015-11-01 NOTE — Telephone Encounter (Signed)
Ok six mo worth 

## 2015-11-02 ENCOUNTER — Encounter: Payer: Self-pay | Admitting: Family Medicine

## 2015-11-03 ENCOUNTER — Encounter: Payer: Self-pay | Admitting: Family Medicine

## 2015-11-03 ENCOUNTER — Ambulatory Visit (INDEPENDENT_AMBULATORY_CARE_PROVIDER_SITE_OTHER): Payer: BLUE CROSS/BLUE SHIELD | Admitting: Family Medicine

## 2015-11-03 VITALS — BP 112/74 | Temp 98.0°F | Ht 73.0 in | Wt 221.4 lb

## 2015-11-03 DIAGNOSIS — J4520 Mild intermittent asthma, uncomplicated: Secondary | ICD-10-CM | POA: Diagnosis not present

## 2015-11-03 DIAGNOSIS — J019 Acute sinusitis, unspecified: Secondary | ICD-10-CM | POA: Diagnosis not present

## 2015-11-03 DIAGNOSIS — B9689 Other specified bacterial agents as the cause of diseases classified elsewhere: Secondary | ICD-10-CM

## 2015-11-03 DIAGNOSIS — J683 Other acute and subacute respiratory conditions due to chemicals, gases, fumes and vapors: Secondary | ICD-10-CM

## 2015-11-03 MED ORDER — CEFDINIR 300 MG PO CAPS: 300.0000 mg | ORAL_CAPSULE | Freq: Two times a day (BID) | ORAL | Status: DC

## 2015-11-03 MED ORDER — ALBUTEROL SULFATE HFA 108 (90 BASE) MCG/ACT IN AERS: 2.0000 | INHALATION_SPRAY | Freq: Four times a day (QID) | RESPIRATORY_TRACT | Status: DC | PRN

## 2015-11-03 MED ORDER — BENZONATATE 100 MG PO CAPS
100.0000 mg | ORAL_CAPSULE | Freq: Four times a day (QID) | ORAL | Status: DC | PRN
Start: 2015-11-03 — End: 2016-04-20

## 2015-11-03 NOTE — Progress Notes (Signed)
   Subjective:    Patient ID: William [name], male    DOB: [birth year], 50 y.o.   MRN: [id number]  URI  This is a recurrent problem. The current episode started 1 to 4 weeks ago. Associated symptoms include congestion, coughing, rhinorrhea and swollen glands. Treatments tried: Levaquin, Amoxicillin,Tessalon Pearles. The treatment provided mild relief.   abd slowly but surely claming down  Coughing fits occas pinch, overall definitely bettre  Some prod but clear   Tess perles help significantly, vom with bad coguh Patient states no other concerns this visit.  Had a rxn to levaquin Unfortunately still substantial cough. Wheezy at times. Progressive over the last few weeks producive intermittently Review of Systems  HENT: Positive for congestion and rhinorrhea.   Respiratory: Positive for cough.        Objective:   Physical Exam  Alert moderate malaise. H&T moderate his congestion pharynx normal neck supple lungs cough has a wheezy texture heart regular in rhythm no crackles      Assessment & Plan:  Impression persistent rhinosinusitis/bronchitis with ulnar reactive airways plan new antibiotics prescribed. Symptomatic care discussed. Albuterol when necessary for wheezing. Symptoms and warning signs discussed [facility]

## 2015-12-30 ENCOUNTER — Encounter: Payer: Self-pay | Admitting: Family Medicine

## 2015-12-30 ENCOUNTER — Ambulatory Visit (INDEPENDENT_AMBULATORY_CARE_PROVIDER_SITE_OTHER): Payer: BLUE CROSS/BLUE SHIELD | Admitting: Family Medicine

## 2015-12-30 VITALS — Ht 73.0 in

## 2015-12-30 DIAGNOSIS — J111 Influenza due to unidentified influenza virus with other respiratory manifestations: Secondary | ICD-10-CM | POA: Diagnosis not present

## 2015-12-30 MED ORDER — OSELTAMIVIR PHOSPHATE 75 MG PO CAPS: 75.0000 mg | ORAL_CAPSULE | Freq: Two times a day (BID) | ORAL | Status: DC

## 2015-12-30 NOTE — Progress Notes (Signed)
   Subjective:    Patient ID: William [name], male    DOB: 10/25/1965, 50 y.o.   MRN: [id number]  Influenza Associated symptoms include chills, congestion, coughing, fatigue, a fever, headaches and myalgias. Associated symptoms comments: Runny nose. Treatments tried: Advil, Benadrylm Decongestants, Tylenol. The treatment provided no relief.   Flulike illness over the past 36 hours body aches fever chills cough runny nose denies nausea vomiting diarrhea   Review of Systems  Constitutional: Positive for fever, chills and fatigue.  HENT: Positive for congestion.   Respiratory: Positive for cough.   Musculoskeletal: Positive for myalgias.  Neurological: Positive for headaches.       Objective:   Physical Exam  Constitutional: He appears well-developed.  HENT:  Head: Normocephalic.  Mouth/Throat: Oropharynx is clear and moist. No oropharyngeal exudate.  Neck: Normal range of motion.  Cardiovascular: Normal rate, regular rhythm and normal heart sounds.   No murmur heard. Pulmonary/Chest: Effort normal and breath sounds normal. He has no wheezes.  Lymphadenopathy:    He has no cervical adenopathy.  Neurological: He exhibits normal muscle tone.  Skin: Skin is warm and dry.  Nursing note and vitals reviewed. Patient does not appear toxic        Assessment & Plan:  Influenza-the patient was diagnosed with influenza. Patient/family educated about the flu and warning signs to watch for. If difficulty breathing, severe neck pain and stiffness, cyanosis, disorientation, or progressive worsening then immediately get rechecked at that ER. If progressive symptoms be certain to be rechecked. Supportive measures such as Tylenol/ibuprofen was discussed. No aspirin use in children. And influenza home care instruction sheet was given. Tamiflu prescribed Recommend follow-up if progressive troubles Go to ER if worse

## 2015-12-30 NOTE — Patient Instructions (Signed)

## 2016-01-09 ENCOUNTER — Other Ambulatory Visit: Payer: Self-pay | Admitting: Family Medicine

## 2016-02-21 ENCOUNTER — Other Ambulatory Visit: Payer: Self-pay | Admitting: Family Medicine

## 2016-03-29 ENCOUNTER — Other Ambulatory Visit: Payer: Self-pay | Admitting: Family Medicine

## 2016-04-10 ENCOUNTER — Other Ambulatory Visit: Payer: Self-pay | Admitting: Family Medicine

## 2016-04-10 DIAGNOSIS — Z029 Encounter for administrative examinations, unspecified: Secondary | ICD-10-CM

## 2016-04-20 ENCOUNTER — Encounter: Payer: Self-pay | Admitting: Family Medicine

## 2016-04-20 ENCOUNTER — Ambulatory Visit (INDEPENDENT_AMBULATORY_CARE_PROVIDER_SITE_OTHER): Payer: BLUE CROSS/BLUE SHIELD | Admitting: Family Medicine

## 2016-04-20 VITALS — BP 128/86 | Ht 73.0 in | Wt 222.8 lb

## 2016-04-20 DIAGNOSIS — E785 Hyperlipidemia, unspecified: Secondary | ICD-10-CM | POA: Diagnosis not present

## 2016-04-20 DIAGNOSIS — Z79899 Other long term (current) drug therapy: Secondary | ICD-10-CM | POA: Diagnosis not present

## 2016-04-20 DIAGNOSIS — N400 Enlarged prostate without lower urinary tract symptoms: Secondary | ICD-10-CM

## 2016-04-20 DIAGNOSIS — Z125 Encounter for screening for malignant neoplasm of prostate: Secondary | ICD-10-CM

## 2016-04-20 MED ORDER — ZOLPIDEM TARTRATE 10 MG PO TABS: ORAL_TABLET | ORAL | Status: DC

## 2016-04-20 MED ORDER — LISINOPRIL 5 MG PO TABS: ORAL_TABLET | ORAL | Status: DC

## 2016-04-20 MED ORDER — OMEPRAZOLE 40 MG PO CPDR: 40.0000 mg | DELAYED_RELEASE_CAPSULE | Freq: Every day | ORAL | Status: DC

## 2016-04-20 MED ORDER — LOVASTATIN 40 MG PO TABS: ORAL_TABLET | ORAL | Status: DC

## 2016-04-20 NOTE — Progress Notes (Signed)
   Subjective:    Patient ID: William [name], male    DOB: [birth year], 50 y.o.   MRN: [id number]  Hypertension This is a chronic problem. The current episode started more than 1 year ago. Pertinent negatives include no headaches. Risk factors for coronary artery disease include dyslipidemia and male gender. Treatments tried: lisinopril. There are no compliance problems.    BP numbers overall good control  Patient continues to take lipid medication regularly. No obvious side effects from it. Generally does not miss a dose. Prior blood work results are reviewed with patient. Patient continues to work on fat intake in diet  Sudden urgr to urinate, got to go immed, with extreme urgenecy  Up once per night if not drining post seven   Ambien ha helped, doing well. States deftly needs this medicine to maintain. Restfulness. No obvious side effects from morning.   Exercise good, shoulders better, injec of low back calmed things down    Patient with c/o drinking alt of water and having sudden onset urgency  Review of Systems  Constitutional: Negative for activity change, appetite change and fatigue.  Gastrointestinal: Negative for abdominal pain.  Neurological: Negative for headaches.  Psychiatric/Behavioral: Negative for behavioral problems.       Objective:   Physical Exam  Alert vitals stable. Blood pressure good on repeat. HEENT normal. TMs normal. Pharynx normal neck supple no lymphadenopathy. Lungs clear. Heart regular in rhythm. Ankles without edema.      Assessment & Plan:  Impression 1 hyperlipidemia and discuss the need for medicine now #2 prostate hypertrophy progressive now with significant urinary symptoms discussed patient like to start medications #3 insomnia ongoing need for meds meds refilled #4 hypertension good control maintain same meds discussed and felt plan initiate Flomax

## 2016-04-21 ENCOUNTER — Telehealth: Payer: Self-pay | Admitting: Family Medicine

## 2016-04-21 DIAGNOSIS — N400 Enlarged prostate without lower urinary tract symptoms: Secondary | ICD-10-CM | POA: Insufficient documentation

## 2016-04-21 MED ORDER — TAMSULOSIN HCL 0.4 MG PO CAPS: 0.4000 mg | ORAL_CAPSULE | Freq: Every day | ORAL | Status: DC

## 2016-04-21 NOTE — Telephone Encounter (Signed)
Pt called stating that Dr was suppose to call in flomax      William [name]

## 2016-04-21 NOTE — Telephone Encounter (Signed)
Prescription sent electronically to pharmacy. Patient notified. 

## 2016-04-21 NOTE — Telephone Encounter (Signed)
0.4 mg flomax numb 90 one qhs three ref

## 2016-06-26 ENCOUNTER — Encounter: Payer: Self-pay | Admitting: Family Medicine

## 2016-06-26 ENCOUNTER — Ambulatory Visit (INDEPENDENT_AMBULATORY_CARE_PROVIDER_SITE_OTHER): Payer: BLUE CROSS/BLUE SHIELD | Admitting: Family Medicine

## 2016-06-26 ENCOUNTER — Other Ambulatory Visit (HOSPITAL_COMMUNITY)
Admission: AD | Admit: 2016-06-26 | Discharge: 2016-06-26 | Disposition: A | Discharge status: Home / Self Care | Payer: BLUE CROSS/BLUE SHIELD | Source: Ambulatory Visit | Attending: Family Medicine | Admitting: Family Medicine

## 2016-06-26 VITALS — BP 118/76 | Temp 98.4°F | Ht 73.0 in | Wt 215.0 lb

## 2016-06-26 DIAGNOSIS — R1084 Generalized abdominal pain: Secondary | ICD-10-CM | POA: Insufficient documentation

## 2016-06-26 DIAGNOSIS — Z125 Encounter for screening for malignant neoplasm of prostate: Secondary | ICD-10-CM | POA: Diagnosis not present

## 2016-06-26 DIAGNOSIS — N410 Acute prostatitis: Secondary | ICD-10-CM | POA: Diagnosis not present

## 2016-06-26 DIAGNOSIS — E785 Hyperlipidemia, unspecified: Secondary | ICD-10-CM | POA: Diagnosis not present

## 2016-06-26 LAB — BASIC METABOLIC PANEL
ANION GAP: 9 (ref 5–15)
BUN: 13 mg/dL (ref 6–20)
CHLORIDE: 103 mmol/L (ref 101–111)
CO2: 27 mmol/L (ref 22–32)
Calcium: 9.3 mg/dL (ref 8.9–10.3)
Creatinine, Ser: 1.01 mg/dL (ref 0.61–1.24)
GFR calc Af Amer: >60 mL/min (ref >60)
GLUCOSE: 96 mg/dL (ref 65–99)
POTASSIUM: 3.8 mmol/L (ref 3.5–5.1)
Sodium: 139 mmol/L (ref 135–145)

## 2016-06-26 LAB — HEPATIC FUNCTION PANEL
ALK PHOS: 45 U/L (ref 38–126)
ALT: 32 U/L (ref 17–63)
AST: 29 U/L (ref 15–41)
Albumin: 5 g/dL (ref 3.5–5.0)
BILIRUBIN DIRECT: 0.1 mg/dL (ref 0.1–0.5)
Indirect Bilirubin: 0.6 mg/dL (ref 0.3–0.9)
TOTAL PROTEIN: 7.6 g/dL (ref 6.5–8.1)
Total Bilirubin: 0.7 mg/dL (ref 0.3–1.2)

## 2016-06-26 LAB — CBC WITH DIFFERENTIAL/PLATELET
BASOS ABS: 0 10*3/uL (ref 0.0–0.1)
Basophils Relative: 0 %
Eosinophils Absolute: 0.1 10*3/uL (ref 0.0–0.7)
Eosinophils Relative: 2 %
HCT: 45.2 % (ref 39.0–52.0)
HEMOGLOBIN: 15.8 g/dL (ref 13.0–17.0)
LYMPHS ABS: 1.9 10*3/uL (ref 0.7–4.0)
LYMPHS PCT: 31 %
MCH: 29.7 pg (ref 26.0–34.0)
MCHC: 35 g/dL (ref 30.0–36.0)
MCV: 85 fL (ref 78.0–100.0)
Monocytes Absolute: 0.4 10*3/uL (ref 0.1–1.0)
Monocytes Relative: 6 %
NEUTROS ABS: 3.8 10*3/uL (ref 1.7–7.7)
NEUTROS PCT: 61 %
PLATELETS: 209 10*3/uL (ref 150–400)
RBC: 5.32 MIL/uL (ref 4.22–5.81)
RDW: 12.8 % (ref 11.5–15.5)
WBC: 6.2 10*3/uL (ref 4.0–10.5)

## 2016-06-26 LAB — LIPASE, BLOOD: Lipase: 20 U/L (ref 11–51)

## 2016-06-26 MED ORDER — CIPROFLOXACIN HCL 500 MG PO TABS: 500.0000 mg | ORAL_TABLET | Freq: Two times a day (BID) | ORAL | 0 refills | Status: DC

## 2016-06-26 MED ORDER — METRONIDAZOLE 500 MG PO TABS: 500.0000 mg | ORAL_TABLET | Freq: Three times a day (TID) | ORAL | 0 refills | Status: DC

## 2016-06-26 NOTE — Progress Notes (Signed)
   Subjective:    Patient ID: William [name], male    DOB: Dec 15, 1965, 50 y.o.   MRN: [id number]  Abdominal Pain  This is a new problem. The current episode started in the past 7 days. Pertinent negatives include no dysuria or fever. Associated symptoms comments: Chills, Headache, diarrhea. Treatments tried: some left over cipro, pain med. diverticulitis   This patient has history of diverticulitis couple years ago had a section of colon removed because of diverticulitis with perforation patient relates Thursday Friday night to get up 6 or 7 times to urinate has described some slight dysuria denies high fever chills relates lower abdominal pressure pain discomfort he is concerned about the possibility of diverticulitis versus prostate infection   Review of Systems  Constitutional: Negative for fatigue and fever.  HENT: Negative for congestion.   Respiratory: Negative for cough.   Cardiovascular: Negative for chest pain.  Gastrointestinal: Positive for abdominal pain.  Genitourinary: Negative for dysuria.       Objective:   Physical Exam  Constitutional: He appears well-developed.  HENT:  Head: Normocephalic.  Mouth/Throat: Oropharynx is clear and moist. No oropharyngeal exudate.  Neck: Normal range of motion.  Cardiovascular: Normal rate, regular rhythm and normal heart sounds.   No murmur heard. Pulmonary/Chest: Effort normal and breath sounds normal. He has no wheezes.  Abdominal: He exhibits no distension. There is tenderness (the tenderness is mainly in the left lower and lower mid quadrant region there is no guarding or rebound).  Lymphadenopathy:    He has no cervical adenopathy.  Neurological: He exhibits normal muscle tone.  Skin: Skin is warm and dry.  Nursing note and vitals reviewed. Prostate is enlarged moderately tender        Assessment & Plan:  Probable prostatitis but cannot rule out the possibility of a low sigmoid diverticulitis. Patient would benefit from  Cipro twice a day over the course of next 3 weeks and Flagyl 3 times a day for the next 7 days stat lab work was ordered await the results of this patient was told that if he gets progressively worse over the course of the next 45 days he would need to have a CT scan and a follow-up visit if lab work looks good then we will year toward rechecking his exam within a week's time  We will hold off on CAT scan currently patient is had several CAT scans ideally only will need to do a CAT scan if clinically indicated currently right now presumptive treatment would be fine this was discussed with the patient he agrees  It should be noted that his white blood count overall look good. Liver function and lipase kidney functions look good. Continue on antibiotics as directed. Patient spoken with. Follow-up if progressive troubles. Recheck next week as planned sooner if any problems

## 2016-06-27 ENCOUNTER — Telehealth: Payer: Self-pay | Admitting: Family Medicine

## 2016-06-27 NOTE — Telephone Encounter (Signed)
Review blood work results in results folder. °

## 2016-07-02 IMAGING — CT CT ABD-PELV W/ CM
2 of 4 series · 16 of 46 positions shown, 18 images · IV contrast (Omnipaque 300)
Comparison: 11/22/2013

CLINICAL DATA: Left side abdominal pain for 1 month, history of
diverticulitis with partial colonic resection, status
postcholecystectomy, status post appendectomy and

EXAM:
CT ABDOMEN AND PELVIS WITH CONTRAST
TECHNIQUE: Multidetector CT imaging of the abdomen and pelvis was performed
using the standard protocol following bolus administration of
intravenous contrast.
CONTRAST:  100mL OMNIPAQUE IOHEXOL 300 MG/ML  SOLN

[Series 2: abd_pel_with 5.0 b40f · axial · 0.79mm/px · z∈[+484,+960]mm · 13 of 106 slices shown, 15 images]
[im 6/106  soft-tissue]
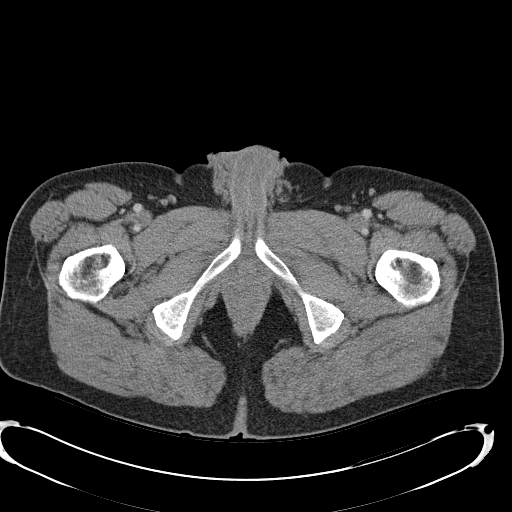
[im 6/106  bone]
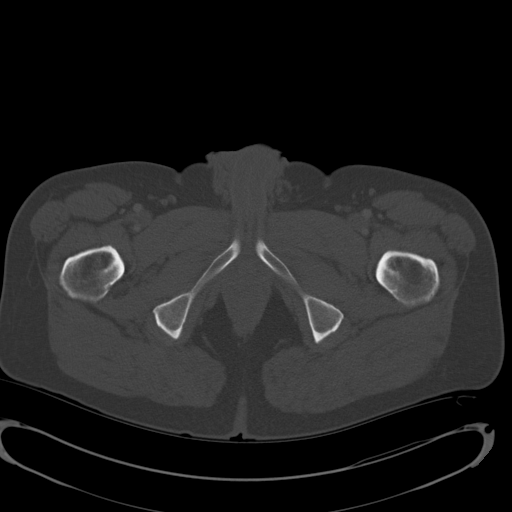
[im 16/106  soft-tissue]
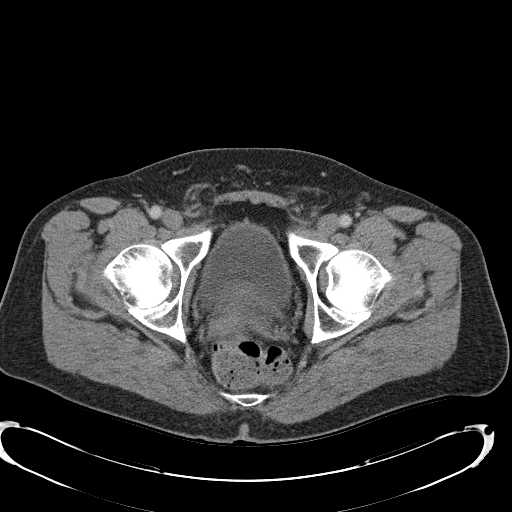
[im 21/106  soft-tissue]
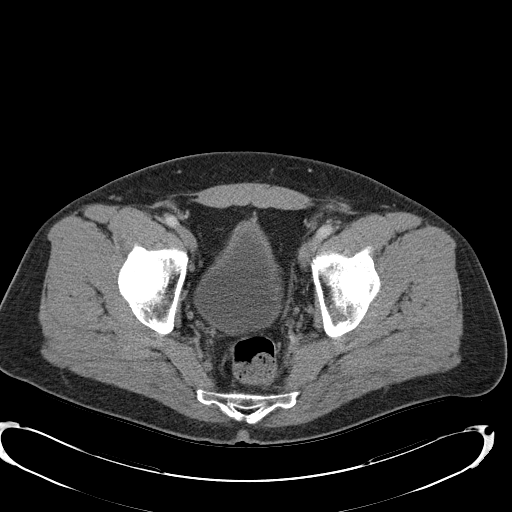
[im 31/106  soft-tissue]
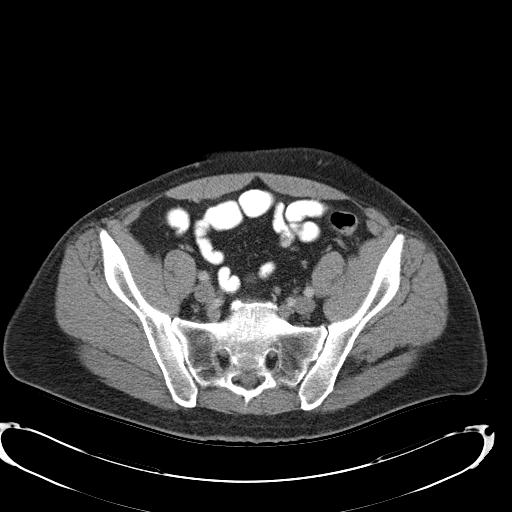
[im 36/106  soft-tissue]
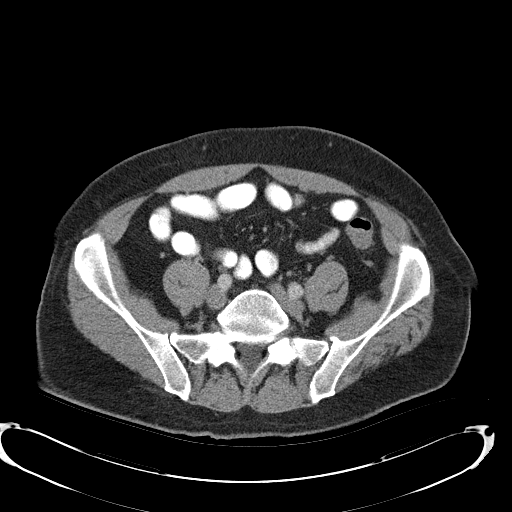
[im 46/106  soft-tissue]
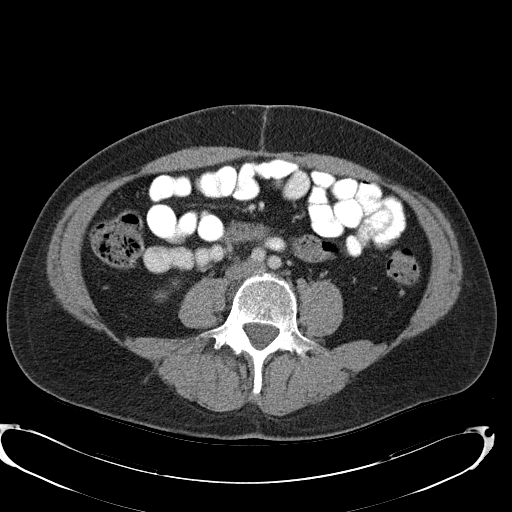
[im 56/106  soft-tissue]
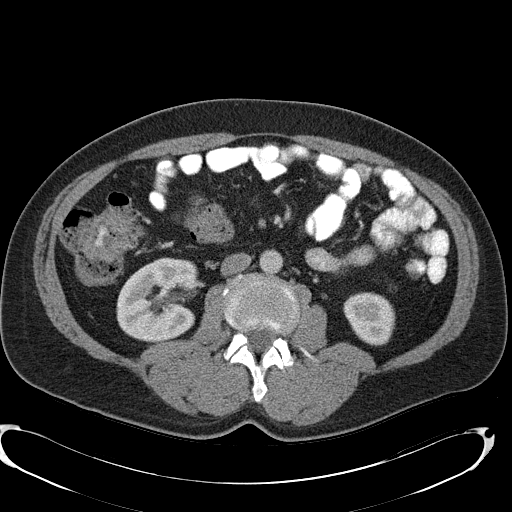
[im 61/106  soft-tissue]
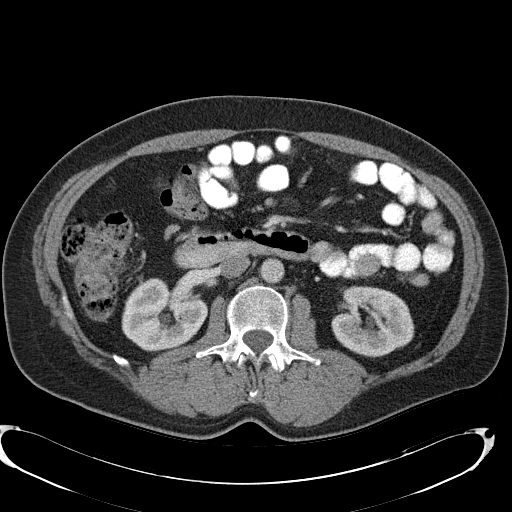
[im 71/106  soft-tissue]
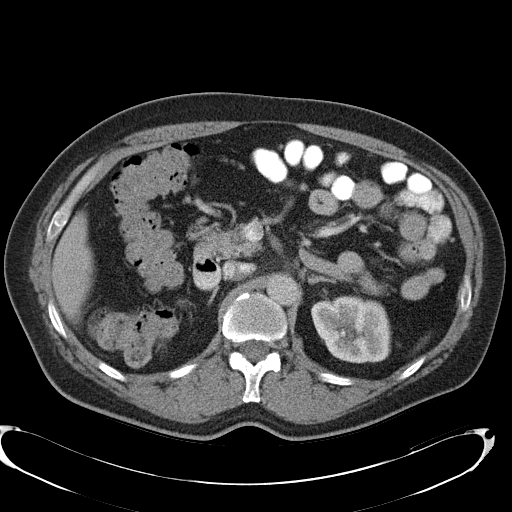
[im 71/106  bone]
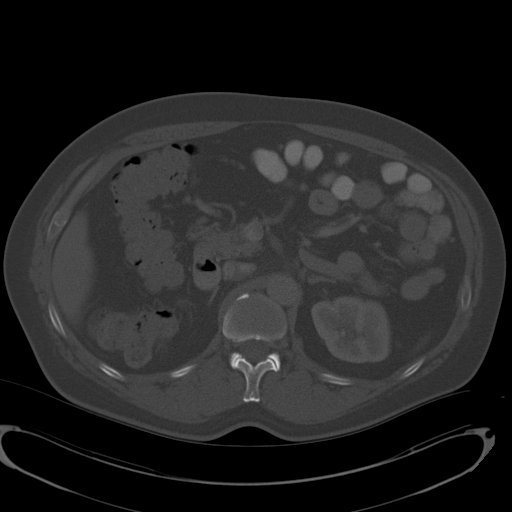
[im 76/106  soft-tissue]
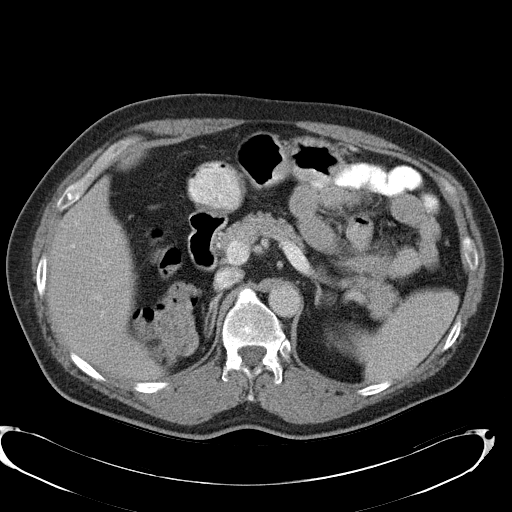
[im 86/106  soft-tissue]
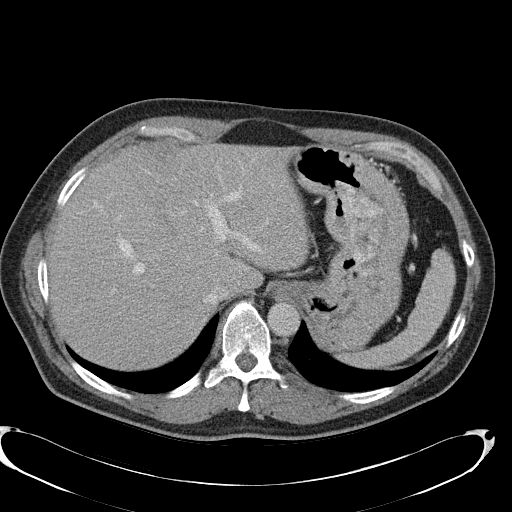
[im 91/106  soft-tissue]
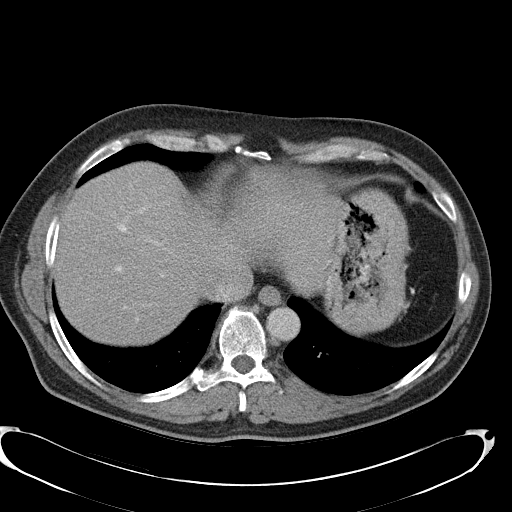
[im 101/106  soft-tissue]
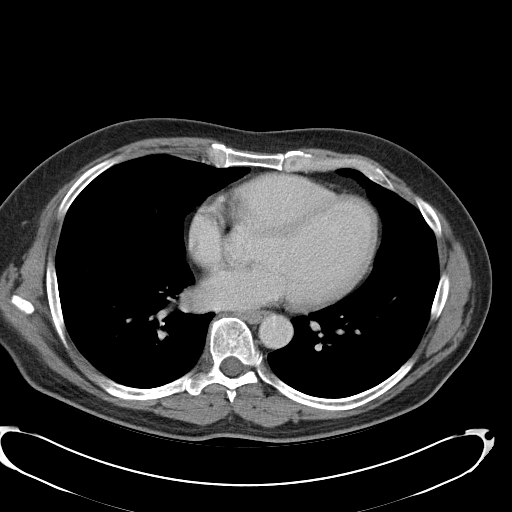

[Series 3: abd_pel_with 3.0 spo cor · coronal · 0.84mm/px · 3 of 85 slices shown]
[im 29/85  soft-tissue]
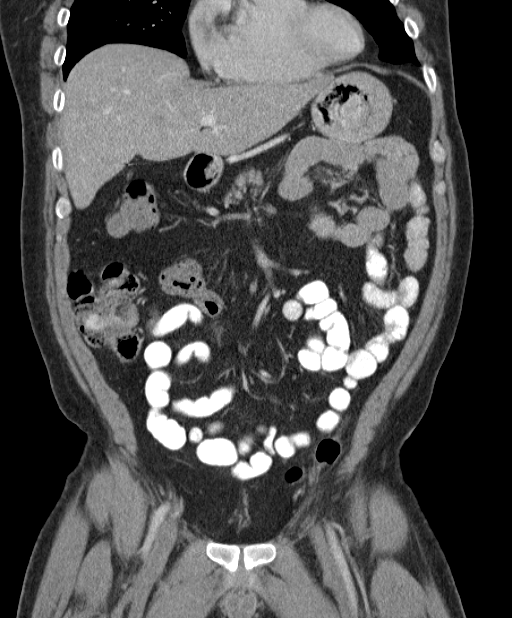
[im 38/85  soft-tissue]
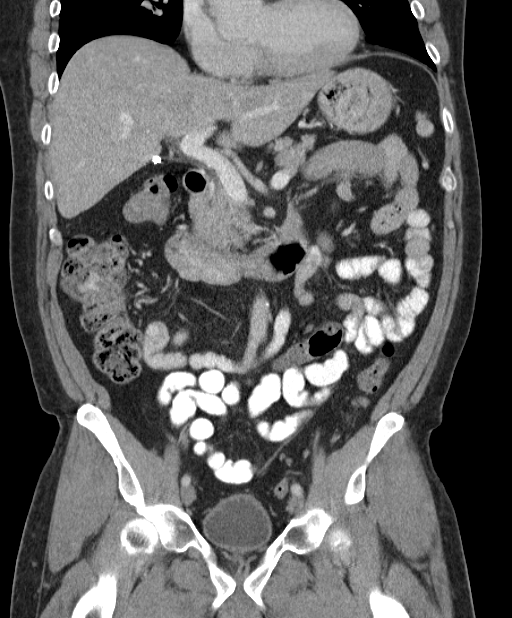
[im 47/85  soft-tissue]
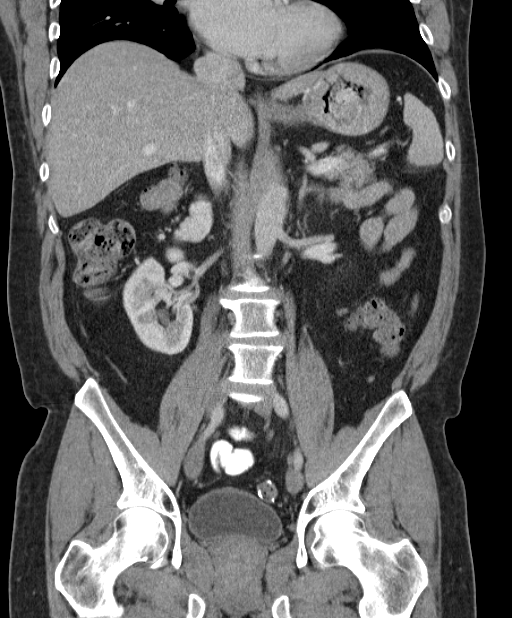

[16 of 46 positions shown; findings below may reference images not displayed]

FINDINGS: Lung bases are unremarkable. Sagittal images of the spine shows mild
degenerative changes thoracolumbar spine. The patient is status
postcholecystectomy.

Enhanced liver, pancreas, spleen and adrenal glands are
unremarkable. Moderate stool noted in right colon. Some colonic
stool noted in transverse colon and descending colon. Few
diverticula are noted in descending colon and proximal sigmoid
colon. There is no evidence of acute diverticulitis. Postsurgical
changes are noted in sigmoid colon. There is no evidence of colonic
colonic stricture. Some stool noted in distal sigmoid colon and
rectum. Prostate gland and seminal vesicles are unremarkable. The
patient is status post appendectomy. Terminal ileum is unremarkable.
No pericecal inflammation.

No small bowel obstruction.  No ascites or free air.  No adenopathy.

Kidneys are symmetrical in size and enhancement. No hydronephrosis
or hydroureter.

Delayed renal images shows bilateral renal symmetrical excretion.
Bilateral visualized proximal ureter is unremarkable.

No aortic aneurysm.
IMPRESSION: 1. No hydronephrosis or hydroureter.
2. Moderate stool noted in right colon. No pericecal inflammation.
Status post appendectomy.
3. Postsurgical changes are noted sigmoid colon. No evidence of
colonic stricture. Few diverticula are noted in distal left colon
and proximal sigmoid colon. No evidence of acute diverticulitis.
4. No small bowel obstruction.
5. Degenerative changes thoracolumbar spine.

## 2016-07-03 ENCOUNTER — Ambulatory Visit (INDEPENDENT_AMBULATORY_CARE_PROVIDER_SITE_OTHER): Payer: BLUE CROSS/BLUE SHIELD | Admitting: Family Medicine

## 2016-07-03 ENCOUNTER — Encounter: Payer: Self-pay | Admitting: Family Medicine

## 2016-07-03 VITALS — BP 128/82 | Ht 73.0 in | Wt 219.8 lb

## 2016-07-03 DIAGNOSIS — Z23 Encounter for immunization: Secondary | ICD-10-CM

## 2016-07-03 DIAGNOSIS — R1084 Generalized abdominal pain: Secondary | ICD-10-CM | POA: Diagnosis not present

## 2016-07-03 DIAGNOSIS — J301 Allergic rhinitis due to pollen: Secondary | ICD-10-CM

## 2016-07-03 MED ORDER — FLUTICASONE PROPIONATE 50 MCG/ACT NA SUSP: 2.0000 | Freq: Every day | NASAL | 6 refills | Status: DC

## 2016-07-03 NOTE — Progress Notes (Signed)
   Subjective:    Patient ID: William [name], male    DOB: 04/25/1966, 50 y.o.   MRN: [id number]  HPI Patient arrives office for a lengthy discussion regarding his symptomatology. He goes into a rather thorough description of the events leading up to his last visit here. States overall things have improved considerably. Still though having discomfort at times. Not sure whether he experienced prostatitis or diverticulitis   Patient arrives for a follow up on abdominal pain(prostatitis vs diverticulitis)  Wiped out chills and lost appetite and felt diminshed energy  Has resumed, most discomfort somewhat better, still some disc   Allergie kicking up may need to ad, notes slight sore throat several days duration. States he sometimes uses his wife's Flonase with significant improvement  Review of Systems No headache, no major weight loss or weight gain, no chest pain no back pain abdominal pain no change in bowel habits complete ROS otherwise negative     Objective:   Physical Exam  Alert vitals stable, NAD. Blood pressure good on repeat. HEENT normal. Lungs clear. Heart regular rate and rhythm. Diffuse mild low abdominal tenderness none discrete no rebound no guarding.        Assessment & Plan:  Impression 1 recent diverticulitis versus prostatitis. Patient seems to think he had some of both. Really difficult to tell. I've advised the patient with another exacerbation we will scan his abdomen to rule out recurrent diverticulitis. Long discussion held in this regard #2 allergic rhinitis will add Flonase when necessary symptom care discussed. Flu shot today. Finish antibiotics. Warning signs discussed

## 2016-07-06 ENCOUNTER — Telehealth: Payer: Self-pay | Admitting: Family Medicine

## 2016-07-06 ENCOUNTER — Ambulatory Visit (HOSPITAL_COMMUNITY)
Admission: RE | Admit: 2016-07-06 | Discharge: 2016-07-06 | Disposition: A | Discharge status: Home / Self Care | Payer: BLUE CROSS/BLUE SHIELD | Source: Ambulatory Visit | Attending: Family Medicine | Admitting: Family Medicine

## 2016-07-06 ENCOUNTER — Ambulatory Visit (INDEPENDENT_AMBULATORY_CARE_PROVIDER_SITE_OTHER): Payer: BLUE CROSS/BLUE SHIELD | Admitting: Family Medicine

## 2016-07-06 ENCOUNTER — Encounter: Payer: Self-pay | Admitting: Family Medicine

## 2016-07-06 VITALS — BP 102/80 | Temp 97.4°F | Ht 73.0 in | Wt 213.0 lb

## 2016-07-06 DIAGNOSIS — R109 Unspecified abdominal pain: Secondary | ICD-10-CM

## 2016-07-06 DIAGNOSIS — R93422 Abnormal radiologic findings on diagnostic imaging of left kidney: Secondary | ICD-10-CM | POA: Insufficient documentation

## 2016-07-06 DIAGNOSIS — R1084 Generalized abdominal pain: Secondary | ICD-10-CM | POA: Diagnosis not present

## 2016-07-06 DIAGNOSIS — R93421 Abnormal radiologic findings on diagnostic imaging of right kidney: Secondary | ICD-10-CM | POA: Diagnosis not present

## 2016-07-06 MED ORDER — LEVOFLOXACIN 500 MG PO TABS: ORAL_TABLET | ORAL | 0 refills | Status: DC

## 2016-07-06 MED ORDER — IOPAMIDOL (ISOVUE-300) INJECTION 61%
100.0000 mL | Freq: Once | INTRAVENOUS | Status: AC | PRN
  Administered 2016-07-06: 100 mL via INTRAVENOUS

## 2016-07-06 NOTE — Telephone Encounter (Signed)
Patient is dealing with diverticulitis.  He is not able to eat anything at all now.  Nothing worse, no fever.  He is just having nausea and increased pain and wanted to be sure this was not too concerning and he doesn't need to come in.

## 2016-07-06 NOTE — Telephone Encounter (Signed)
Spoke with patient and per Dr.Steve Luking- informed patient office visit today this morning. Patient verbalized understanding and was transferred to the front desk to schedule office visit.

## 2016-07-06 NOTE — Progress Notes (Signed)
   Subjective:    Patient ID: William [name], male    DOB: [birth year], 50 y.o.   MRN: [id number]  Abdominal Pain  This is a new problem. The current episode started in the past 7 days. The problem occurs intermittently. The problem has been unchanged. The pain is located in the generalized abdominal region. The pain is moderate. The abdominal pain does not radiate. Associated symptoms include diarrhea and nausea. Nothing aggravates the pain. The pain is relieved by nothing. He has tried antibiotics for the symptoms. The treatment provided no relief.   Patient has no other concerns at this time.   Using crackers and with any more food gets discomfort and bloating an d pain   Sig abd pain   Pos nausea no vom  Dim ppetite  Pt has some hx of reflux but no partic bad hx of this, soe burping still  Pt had hx of bloating with diverticulitis in the pas t   Review of Systems  Gastrointestinal: Positive for abdominal pain, diarrhea and nausea.  No headache, no major weight loss or weight gain, no chest pain no back pain abdominal pain no change in bowel habits complete ROS otherwise negative      Objective:   Physical Exam Alert vitals stable, NAD. Blood pressure good on repeat. HEENT normal. Lungs clear. Heart regular rate and rhythm. Good bowel sounds positive fairly impressive low abdominal tenderness to deep palpation primarily suprapubic and left lower quadrant. No CVA tenderness.       Assessment & Plan:  Impression multiple different abdominal pains his symptoms but most concerning low abdominal pain and recent apparent diverticulitis. Positive history of diverticular surgery in the past. Recent blood work good. We sent patient for CT abdomen and pelvis. Results for chewing showed no diverticulitis discussed with patient question involvement of kidney so additional antibiotic was prescribed to take next 10 days. We'll follow-up in a couple weeks with urinalysis and recheck. Patient  will have an element of IBS and he realizes that [facility]

## 2016-07-12 ENCOUNTER — Encounter: Payer: Self-pay | Admitting: Family Medicine

## 2016-07-18 ENCOUNTER — Encounter: Payer: Self-pay | Admitting: Family Medicine

## 2016-07-18 ENCOUNTER — Ambulatory Visit (INDEPENDENT_AMBULATORY_CARE_PROVIDER_SITE_OTHER): Payer: BLUE CROSS/BLUE SHIELD | Admitting: Family Medicine

## 2016-07-18 VITALS — BP 130/80 | Ht 73.0 in | Wt 224.1 lb

## 2016-07-18 DIAGNOSIS — R109 Unspecified abdominal pain: Secondary | ICD-10-CM | POA: Diagnosis not present

## 2016-07-18 DIAGNOSIS — M545 Low back pain, unspecified: Secondary | ICD-10-CM

## 2016-07-18 DIAGNOSIS — K219 Gastro-esophageal reflux disease without esophagitis: Secondary | ICD-10-CM | POA: Diagnosis not present

## 2016-07-18 DIAGNOSIS — G8929 Other chronic pain: Secondary | ICD-10-CM

## 2016-07-18 LAB — POCT URINALYSIS DIPSTICK
PH UA: 5
Spec Grav, UA: 1.015

## 2016-07-18 MED ORDER — SUCRALFATE 1 G PO TABS: ORAL_TABLET | ORAL | 0 refills | Status: DC

## 2016-07-18 NOTE — Progress Notes (Signed)
   Subjective:    Patient ID: William [name], male    DOB: 03-17-1966, 50 y.o.   MRN: [id number] Patient presents with numerous concerns HPI Patient is here today for a recheck on his abdominal pain. Patient states that his symptoms have improved a lot. Patient notes other difficulties  Now experiencing postprandial discomfort. Right upper quadrant. Worse after eating. Burning sensation. Generally takes omeprazole for this.  Was concerned about his kidneys. Prior scan revealed possible element of bilateral kidney involvement. Frankly I doubted this but I covered him with antibiotics just in case. Returns to give it urine specimen today.   Notes back pain severe in nature right lumbar region. Hx of bacjk epidural injec with improvement  ` thinks he may need to go back  Review of Systems No headache, no major weight loss or weight gain, no chest pain no back pain abdominal pain no change in bowel habits complete ROS otherwise negative     Objective:   Physical Exam  Alert vitals stable, NAD. Blood pressure good on repeat. HEENT normal. Lungs clear. Heart regular rate and rhythm. Abdominal exam slight right upper quadrant tenderness no rebound no guarding no masses low back spine slightly tender right SI region tender to palpation negative straight leg raise      Assessment & Plan:  Impression 1 flare of gastritis/duodenitis discussed #2 musculoskeletal back pain patient considering going back for injections #3 chronic abdominal symptomatology a challenge with patient's history of intermittent diverticulitis plan add Carafate before meals and at bedtime. Encouraged to get back with specialist if he feels like he needs to for back pain. Urinalysis normals and no further treatment of urinary tract discussed 25 minutes spent most in discussion

## 2016-07-19 ENCOUNTER — Ambulatory Visit: Payer: BLUE CROSS/BLUE SHIELD | Admitting: Family Medicine

## 2016-07-27 ENCOUNTER — Encounter: Payer: Self-pay | Admitting: Family Medicine

## 2016-07-27 ENCOUNTER — Ambulatory Visit (INDEPENDENT_AMBULATORY_CARE_PROVIDER_SITE_OTHER): Payer: BLUE CROSS/BLUE SHIELD | Admitting: Family Medicine

## 2016-07-27 ENCOUNTER — Telehealth: Payer: Self-pay | Admitting: Family Medicine

## 2016-07-27 VITALS — BP 124/84 | Temp 98.4°F | Wt 222.5 lb

## 2016-07-27 DIAGNOSIS — K588 Other irritable bowel syndrome: Secondary | ICD-10-CM | POA: Diagnosis not present

## 2016-07-27 DIAGNOSIS — R1084 Generalized abdominal pain: Secondary | ICD-10-CM

## 2016-07-27 LAB — POCT URINALYSIS DIPSTICK
Blood, UA: NEGATIVE
LEUKOCYTES UA: NEGATIVE
SPEC GRAV UA: 1.025
pH, UA: 5

## 2016-07-27 MED ORDER — DICYCLOMINE HCL 10 MG PO CAPS: 10.0000 mg | ORAL_CAPSULE | Freq: Three times a day (TID) | ORAL | 3 refills | Status: DC | PRN

## 2016-07-27 MED ORDER — HYOSCYAMINE SULFATE 0.125 MG SL SUBL: 0.1250 mg | SUBLINGUAL_TABLET | SUBLINGUAL | 2 refills | Status: DC | PRN

## 2016-07-27 NOTE — Telephone Encounter (Signed)
Patient was prescribed hyoscyamine (LEVSIN SL) 0.125 MG SL tablet today and his insurance is not wanting to cover it.  Dr. Nicki Reaper told him to call if this was the case and we would change to capsules.   [facility] [address]

## 2016-07-27 NOTE — Telephone Encounter (Signed)
Med sent to pharmacy. Patient was notified.  

## 2016-07-27 NOTE — Progress Notes (Signed)
   Subjective:    Patient ID: William [name], male    DOB: [birth year], 50 y.o.   MRN: [id number]  Abdominal Pain  This is a recurrent problem. The pain is located in the generalized abdominal region. Associated symptoms include headaches and nausea. Associated symptoms comments: Bloating, Neck Pain.  He describes as a bloating aching sensation comes and goes sometimes in the upper outer parts of the abdomen sometimes epigastric area denies any dysphagia burning vomiting just describes intermittent nausea and not feeling well has a history of prostatitis history of diverticulitis. No fevers. No bloody stools. Bowel movements inconsistent sometimes constipated sometimes not Patient states no other concerns this visit.  Patient up-to-date on colonoscopy  Review of Systems  Gastrointestinal: Positive for abdominal pain and nausea.  Neurological: Positive for headaches.  Denies cough wheezing dysuria or urinary frequency.     Objective:   Physical Exam HEENT is benign neck no masses lungs clear heart regular abdomen soft no guarding rebound or tenderness subjective discomfort right upper quadrant and somewhat in the left upper quadrant Prostate exam slightly enlarged soft he describes burning through the penis but no true prostate pain with a prostate exam   Urinalysis negative Urinalysis after prostate exam negative    Assessment & Plan:  I doubt that this is prostatitis It is possible that there could be underlying IBS. We will try Levsin SL. Patient will follow-up if fevers bloody stool severe pain worse  Patient is simvastatin update or call us and update 1 week's time if not significantly better next step would be gastroenterology consultation  Patient may use probiotic I do not feel CAT scan indicated I do not feel patient needs to be on antibiotics currently

## 2016-07-27 NOTE — Telephone Encounter (Signed)
Cancel Levsin instead use Bentyl 10 mg 1 capsule 3 times a day when necessary abdominal discomfort/cramps-#45, 3 refills

## 2016-08-04 DIAGNOSIS — M5136 Other intervertebral disc degeneration, lumbar region: Secondary | ICD-10-CM | POA: Diagnosis not present

## 2016-08-04 DIAGNOSIS — M545 Low back pain: Secondary | ICD-10-CM | POA: Diagnosis not present

## 2016-08-30 ENCOUNTER — Encounter: Payer: Self-pay | Admitting: Family Medicine

## 2016-08-30 NOTE — Telephone Encounter (Signed)
Nurse's-this is Dr. Jeannine Kitten patient. Please forward to him

## 2016-09-12 ENCOUNTER — Ambulatory Visit (INDEPENDENT_AMBULATORY_CARE_PROVIDER_SITE_OTHER): Payer: BLUE CROSS/BLUE SHIELD | Admitting: Family Medicine

## 2016-09-12 ENCOUNTER — Encounter: Payer: Self-pay | Admitting: Family Medicine

## 2016-09-12 VITALS — BP 128/76 | Ht 73.0 in | Wt 225.0 lb

## 2016-09-12 DIAGNOSIS — E785 Hyperlipidemia, unspecified: Secondary | ICD-10-CM | POA: Diagnosis not present

## 2016-09-12 DIAGNOSIS — G8929 Other chronic pain: Secondary | ICD-10-CM

## 2016-09-12 DIAGNOSIS — M25512 Pain in left shoulder: Secondary | ICD-10-CM

## 2016-09-12 DIAGNOSIS — N400 Enlarged prostate without lower urinary tract symptoms: Secondary | ICD-10-CM | POA: Diagnosis not present

## 2016-09-12 DIAGNOSIS — F5101 Primary insomnia: Secondary | ICD-10-CM | POA: Diagnosis not present

## 2016-09-12 DIAGNOSIS — M25511 Pain in right shoulder: Secondary | ICD-10-CM

## 2016-09-12 DIAGNOSIS — Z125 Encounter for screening for malignant neoplasm of prostate: Secondary | ICD-10-CM | POA: Diagnosis not present

## 2016-09-12 MED ORDER — TADALAFIL 5 MG PO TABS: 5.0000 mg | ORAL_TABLET | Freq: Every day | ORAL | 5 refills | Status: DC | PRN

## 2016-09-12 MED ORDER — ZOLPIDEM TARTRATE ER 12.5 MG PO TBCR: 12.5000 mg | EXTENDED_RELEASE_TABLET | Freq: Every evening | ORAL | 5 refills | Status: DC | PRN

## 2016-09-12 NOTE — Progress Notes (Signed)
   Subjective:    Patient ID: William [name], male    DOB: [birth year], 50 y.o.   MRN: [id number] Patient arrives office with numerous concerns HPIbilateral shoulder pain.  Fairly severe. Just received injections. States not sure whether helping or not. Positive history of surgery in the past.   Wants to discuss ambien. 10 mg helped initially but the generic not as helpful,for that reason would like to try the Ambien CR 12.5.Knows others on it and that benefited from it would like to try to himself. He is willing to pay a higher co-pay for the trade name.  Twice per night for sleep up urinating then   Now that gerneric not helping     bw just did  Cort both shoulders , arthritis settling in, acting up   800 ibu plus pepcid qhs samples to help pain    cialis 20 helpful in thr past for rerections,lso was helpful for urinating. At this time patient would like to go back on Cialis but primarito help his urination. He notes several times per night he has to get up to urinate. This is very disruptive to his sleep and his rest. Would like to try medication again in order to help       Review of Systems No headache, no major weight loss or weight gain, no chest pain no back pain abdominal pain no change in bowel habits complete ROS otherwise negative     Objective:   Physical Exam  Alert vitals stable, NAD. Blood pressure good on repeat. HEENT normal. Lungs clear. Heart regular rate and rhythm. Shoulder bilateral impingement sign distal strength sensation intact      Assessment & Plan:  Impression prostate hypertrophy worsening with significant urine outflow not multiple times per night patient would like to get on daily Cialis and this is a reasonable request #2 insomnia worsening not responding to generic Ambien would like to try something different #3 chronic shoulder pain. Comments exercise discussed in encourage plan medication changes as noted exercises noted follow-up as  scheduled

## 2016-09-13 DIAGNOSIS — M25511 Pain in right shoulder: Secondary | ICD-10-CM

## 2016-09-13 DIAGNOSIS — M25512 Pain in left shoulder: Secondary | ICD-10-CM

## 2016-09-13 DIAGNOSIS — G8929 Other chronic pain: Secondary | ICD-10-CM | POA: Insufficient documentation

## 2016-09-13 LAB — LIPID PANEL
CHOL/HDL RATIO: 3.7 ratio (ref 0.0–5.0)
CHOLESTEROL TOTAL: 176 mg/dL (ref 100–199)
HDL: 47 mg/dL (ref >39)
LDL CALC: 103 mg/dL — AB (ref 0–99)
Triglycerides: 132 mg/dL (ref 0–149)
VLDL Cholesterol Cal: 26 mg/dL (ref 5–40)

## 2016-09-13 LAB — PSA: Prostate Specific Ag, Serum: 1.6 ng/mL (ref 0.0–4.0)

## 2016-09-15 ENCOUNTER — Telehealth: Payer: Self-pay | Admitting: Family Medicine

## 2016-09-15 MED ORDER — SILDENAFIL CITRATE 20 MG PO TABS: ORAL_TABLET | ORAL | 5 refills | Status: DC

## 2016-09-15 NOTE — Telephone Encounter (Signed)
William [name] generic viagra (?sildenafil ;look upto be sure) 20 mg numb 30(or whatever number [facility] gener[facility] provides 2 to 3 po prn , 5 ref

## 2016-09-15 NOTE — Telephone Encounter (Signed)
Received a fax from patient's insurance stating that Cialis is rejected. Submitted prior Auth to insurance and informed patient. Patient stated that you had another alternative for him? Please advise?

## 2016-09-15 NOTE — Addendum Note (Signed)
Addended by: Ofilia Neas R on: 09/15/2016 11:54 AM   Modules accepted: Orders

## 2016-09-15 NOTE — Telephone Encounter (Signed)
Spoke with patient and informed him per Dr.Steve Luking- we are sending in Sildenafil to [facility]. Patient verbalized understanding.

## 2016-09-18 DIAGNOSIS — M545 Low back pain: Secondary | ICD-10-CM | POA: Diagnosis not present

## 2016-09-18 DIAGNOSIS — M47817 Spondylosis without myelopathy or radiculopathy, lumbosacral region: Secondary | ICD-10-CM | POA: Diagnosis not present

## 2016-09-18 DIAGNOSIS — M5136 Other intervertebral disc degeneration, lumbar region: Secondary | ICD-10-CM | POA: Diagnosis not present

## 2016-09-28 DIAGNOSIS — M545 Low back pain: Secondary | ICD-10-CM | POA: Diagnosis not present

## 2016-09-28 DIAGNOSIS — M47817 Spondylosis without myelopathy or radiculopathy, lumbosacral region: Secondary | ICD-10-CM | POA: Diagnosis not present

## 2016-10-03 ENCOUNTER — Encounter: Payer: Self-pay | Admitting: Family Medicine

## 2016-10-25 ENCOUNTER — Ambulatory Visit (INDEPENDENT_AMBULATORY_CARE_PROVIDER_SITE_OTHER): Payer: BLUE CROSS/BLUE SHIELD | Admitting: Family Medicine

## 2016-10-25 ENCOUNTER — Encounter: Payer: Self-pay | Admitting: Family Medicine

## 2016-10-25 VITALS — Temp 98.9°F | Ht 73.0 in | Wt 228.0 lb

## 2016-10-25 DIAGNOSIS — J310 Chronic rhinitis: Secondary | ICD-10-CM

## 2016-10-25 DIAGNOSIS — J329 Chronic sinusitis, unspecified: Secondary | ICD-10-CM | POA: Diagnosis not present

## 2016-10-25 MED ORDER — CEFDINIR 300 MG PO CAPS: 300.0000 mg | ORAL_CAPSULE | Freq: Two times a day (BID) | ORAL | 0 refills | Status: DC

## 2016-10-25 NOTE — Progress Notes (Signed)
   Subjective:    Patient ID: William [name], male    DOB: 1966-04-03, 51 y.o.   MRN: [id number]  Cough  This is a new problem. The current episode started in the past 7 days. Associated symptoms include a fever, headaches and nasal congestion. Associated symptoms comments: diarrhea. Treatments tried: otc meds.   Started four d ao, raew congestion  Pos green prod cough   advil not helping   Diarrhea   tmax 99.7  dinm enregy   decong prn pos    Review of Systems  Constitutional: Positive for fever.  Respiratory: Positive for cough.   Neurological: Positive for headaches.       Objective:   Physical Exam Alert, mild malaise. Hydration good Vitals stable. frontal/ maxillary tenderness evident positive nasal congestion. pharynx normal neck supple  lungs clear/no crackles or wheezes. heart regular in rhythm        Assessment & Plan:  Impression rhinosinusitis likely post viral, discussed with patient. plan antibiotics prescribed. Questions answered. Symptomatic care discussed. warning signs discussed. WSL

## 2016-10-27 ENCOUNTER — Other Ambulatory Visit: Payer: Self-pay | Admitting: Family Medicine

## 2016-12-05 ENCOUNTER — Other Ambulatory Visit: Payer: Self-pay | Admitting: Family Medicine

## 2016-12-11 DIAGNOSIS — M25511 Pain in right shoulder: Secondary | ICD-10-CM | POA: Diagnosis not present

## 2017-01-04 DIAGNOSIS — F411 Generalized anxiety disorder: Secondary | ICD-10-CM | POA: Diagnosis not present

## 2017-01-11 DIAGNOSIS — F411 Generalized anxiety disorder: Secondary | ICD-10-CM | POA: Diagnosis not present

## 2017-01-17 ENCOUNTER — Encounter: Payer: Self-pay | Admitting: Family Medicine

## 2017-01-18 MED ORDER — TADALAFIL 20 MG PO TABS: 20.0000 mg | ORAL_TABLET | Freq: Every day | ORAL | 0 refills | Status: DC | PRN

## 2017-01-22 DIAGNOSIS — M25571 Pain in right ankle and joints of right foot: Secondary | ICD-10-CM | POA: Diagnosis not present

## 2017-01-26 ENCOUNTER — Other Ambulatory Visit: Payer: Self-pay | Admitting: Family Medicine

## 2017-01-31 DIAGNOSIS — F411 Generalized anxiety disorder: Secondary | ICD-10-CM | POA: Diagnosis not present

## 2017-02-04 ENCOUNTER — Other Ambulatory Visit: Payer: Self-pay | Admitting: Family Medicine

## 2017-02-08 ENCOUNTER — Other Ambulatory Visit: Payer: Self-pay | Admitting: *Deleted

## 2017-02-08 MED ORDER — LOVASTATIN 40 MG PO TABS: ORAL_TABLET | ORAL | 0 refills | Status: DC

## 2017-02-08 MED ORDER — FLUTICASONE PROPIONATE 50 MCG/ACT NA SUSP: 2.0000 | Freq: Every day | NASAL | 2 refills | Status: DC

## 2017-02-21 DIAGNOSIS — F411 Generalized anxiety disorder: Secondary | ICD-10-CM | POA: Diagnosis not present

## 2017-03-13 ENCOUNTER — Ambulatory Visit (INDEPENDENT_AMBULATORY_CARE_PROVIDER_SITE_OTHER): Payer: BLUE CROSS/BLUE SHIELD | Admitting: Family Medicine

## 2017-03-13 ENCOUNTER — Encounter: Payer: Self-pay | Admitting: Family Medicine

## 2017-03-13 VITALS — BP 116/80 | Temp 98.2°F | Ht 73.0 in | Wt 227.2 lb

## 2017-03-13 DIAGNOSIS — Z79899 Other long term (current) drug therapy: Secondary | ICD-10-CM

## 2017-03-13 DIAGNOSIS — I10 Essential (primary) hypertension: Secondary | ICD-10-CM | POA: Insufficient documentation

## 2017-03-13 DIAGNOSIS — E785 Hyperlipidemia, unspecified: Secondary | ICD-10-CM | POA: Diagnosis not present

## 2017-03-13 DIAGNOSIS — F5101 Primary insomnia: Secondary | ICD-10-CM | POA: Diagnosis not present

## 2017-03-13 MED ORDER — LISINOPRIL 5 MG PO TABS: ORAL_TABLET | ORAL | 1 refills | Status: DC

## 2017-03-13 MED ORDER — LOVASTATIN 40 MG PO TABS: ORAL_TABLET | ORAL | 1 refills | Status: DC

## 2017-03-13 NOTE — Patient Instructions (Signed)
Lipoma

## 2017-03-13 NOTE — Progress Notes (Signed)
   Subjective:    Patient ID: William [name], male    DOB: [birth year], 51 y.o.   MRN: [id number] Patient arrives office with numerous concerns HPI Patient in today for knot to right upper thigh. Onset several months ago. Patient stated that knot has gotten noticeably larger.   States no other concerns this visit.   Blood pressure medicine and blood pressure levels reviewed today with patient. Compliant with blood pressure medicine. States does not miss a dose. No obvious side effects. Blood pressure generally good when checked elsewhere. Watching salt intake.  BP numbers 110 to 115  Over 70 to 80, overall good  Ex doing well, cros trainer, feeling good, shoulder limits it  Bit    Patient continues to take lipid medication regularly. No obvious side effects from it. Generally does not miss a dose. Prior blood work results are reviewed with patient. Patient continues to work on fat intake in diet  Diet excellent wachingafter things better  Noticed a knot developing early in the yr, wasn't sure, now larger over the past month, no pain or ds iscomfort. Did have an injury to leg, was in a boot, worked with dr Noemi Chapel    BP good  Sleep issues long term handling the [employer] well    Using vicks sparay plus flonase for  Review of Systems No headache, no major weight loss or weight gain, no chest pain no back pain abdominal pain no change in bowel habits complete ROS otherwise negative     Objective:   Physical Exam Alert and oriented, vitals reviewed and stable, NAD ENT-TM's and ext canals WNL bilat via otoscopic exam Soft palate, tonsils and post pharynx WNL via oropharyngeal exam Neck-symmetric, no masses; thyroid nonpalpable and nontender Pulmonary-no tachypnea or accessory muscle use; Clear without wheezes via auscultation Card--no abnrml murmurs, rhythm reg and rate WNL Carotid pulses symmetric, without bruits Right anterior leg smooth soft diffuse prominence and fat pad above right  knee.       Assessment & Plan:  Impression highly probable simple lipoma discussed I say greater than the 99.8% chance. After discussion patient elects to hold off on referral. Will keep an eye on it #2 hypertension discussed maintain same meds #3 hyperlipidemia status uncertain. Compliance discussed prior blood work reviewed. Need to assess further plan medications refilled. Diet exercise discussed. Recheck in 6 months. [facility] chronic

## 2017-03-14 LAB — HEPATIC FUNCTION PANEL
ALBUMIN: 5.1 g/dL (ref 3.5–5.5)
ALK PHOS: 59 IU/L (ref 39–117)
ALT: 30 IU/L (ref 0–44)
AST: 26 IU/L (ref 0–40)
Bilirubin Total: 0.7 mg/dL (ref 0.0–1.2)
Bilirubin, Direct: 0.14 mg/dL (ref 0.00–0.40)
Total Protein: 7.4 g/dL (ref 6.0–8.5)

## 2017-03-14 LAB — LIPID PANEL
CHOLESTEROL TOTAL: 151 mg/dL (ref 100–199)
Chol/HDL Ratio: 4.1 ratio (ref 0.0–5.0)
HDL: 37 mg/dL — AB (ref >39)
LDL Calculated: 89 mg/dL (ref 0–99)
Triglycerides: 124 mg/dL (ref 0–149)
VLDL CHOLESTEROL CAL: 25 mg/dL (ref 5–40)

## 2017-03-15 ENCOUNTER — Other Ambulatory Visit: Payer: Self-pay | Admitting: Family Medicine

## 2017-03-15 NOTE — Telephone Encounter (Signed)
Ok six ref 

## 2017-03-19 ENCOUNTER — Encounter: Payer: Self-pay | Admitting: Family Medicine

## 2017-03-20 DIAGNOSIS — F411 Generalized anxiety disorder: Secondary | ICD-10-CM | POA: Diagnosis not present

## 2017-03-21 ENCOUNTER — Encounter: Payer: Self-pay | Admitting: Family Medicine

## 2017-04-04 DIAGNOSIS — F411 Generalized anxiety disorder: Secondary | ICD-10-CM | POA: Diagnosis not present

## 2017-04-25 DIAGNOSIS — F411 Generalized anxiety disorder: Secondary | ICD-10-CM | POA: Diagnosis not present

## 2017-04-26 DIAGNOSIS — M19011 Primary osteoarthritis, right shoulder: Secondary | ICD-10-CM | POA: Diagnosis not present

## 2017-04-26 DIAGNOSIS — M19012 Primary osteoarthritis, left shoulder: Secondary | ICD-10-CM | POA: Diagnosis not present

## 2017-05-07 ENCOUNTER — Ambulatory Visit (HOSPITAL_COMMUNITY)
Admission: RE | Admit: 2017-05-07 | Discharge: 2017-05-07 | Disposition: A | Discharge status: Home / Self Care | Payer: BLUE CROSS/BLUE SHIELD | Source: Ambulatory Visit | Attending: Family Medicine | Admitting: Family Medicine

## 2017-05-07 ENCOUNTER — Ambulatory Visit (INDEPENDENT_AMBULATORY_CARE_PROVIDER_SITE_OTHER): Payer: BLUE CROSS/BLUE SHIELD | Admitting: Family Medicine

## 2017-05-07 ENCOUNTER — Other Ambulatory Visit (HOSPITAL_COMMUNITY)
Admission: RE | Admit: 2017-05-07 | Discharge: 2017-05-07 | Disposition: A | Discharge status: Home / Self Care | Payer: BLUE CROSS/BLUE SHIELD | Source: Ambulatory Visit | Attending: Family Medicine | Admitting: Family Medicine

## 2017-05-07 ENCOUNTER — Encounter: Payer: Self-pay | Admitting: Family Medicine

## 2017-05-07 VITALS — BP 134/98 | Temp 98.1°F | Ht 73.0 in | Wt 221.0 lb

## 2017-05-07 DIAGNOSIS — B349 Viral infection, unspecified: Secondary | ICD-10-CM

## 2017-05-07 DIAGNOSIS — M7989 Other specified soft tissue disorders: Secondary | ICD-10-CM | POA: Diagnosis not present

## 2017-05-07 LAB — D-DIMER, QUANTITATIVE: D-Dimer, Quant: 0.41 ug/mL-FEU (ref 0.00–0.50)

## 2017-05-07 NOTE — Progress Notes (Signed)
   Subjective:    Patient ID: William [name], male    DOB: 25-Dec-1965, 51 y.o.   MRN: [id number]  HPI Patient arrives today states he woke up Friday morning with a painful swollen left calf.States he has not felt well since last Wednesday, had head aches.No fever. He has been taking Advil and this has helped some with the pain.   Started feeling onto the best wed, stomach acted up a bit, and mild headaches  On Friday bean to get sensitive and painful in the calf  Had couple shoulder injections of steroids, in both arms, achey into the left arm and shoulders  Used local ice  Recalls no injury   Neck shoulders   occa leg and calf feels tight long term for a few months   Review of Systems No headache, no major weight loss or weight gain, no chest pain no back pain abdominal pain no change in bowel habits complete ROS otherwise negative     Objective:   Physical Exam Alert and oriented, vitals reviewed and stable, NAD ENT-TM's and ext canals WNL bilat via otoscopic exam Soft palate, tonsils and post pharynx WNL via oropharyngeal exam Neck-symmetric, no masses; thyroid nonpalpable and nontender Pulmonary-no tachypnea or accessory muscle use; Clear without wheezes via auscultation Card--no abnrml murmurs, rhythm reg and rate WNL Carotid pulses symmetric, without bruits Left posterior calf tender to palpation diameter 1.5 cm greater than right leg.       Assessment & Plan:  Impression 1 left leg swelling with tenderness. Patient does have some risk factors long discussion held. Work up initiated. Addendum d-dimer and ultrasound fortunately came back negative, likely muscular strain #2 probable viral syndrome symptom care discussed

## 2017-05-23 DIAGNOSIS — F411 Generalized anxiety disorder: Secondary | ICD-10-CM | POA: Diagnosis not present

## 2017-06-11 ENCOUNTER — Ambulatory Visit (INDEPENDENT_AMBULATORY_CARE_PROVIDER_SITE_OTHER): Payer: BLUE CROSS/BLUE SHIELD | Admitting: Family Medicine

## 2017-06-11 ENCOUNTER — Encounter: Payer: Self-pay | Admitting: Family Medicine

## 2017-06-11 VITALS — BP 128/90 | Temp 98.6°F | Ht 73.0 in | Wt 224.0 lb

## 2017-06-11 DIAGNOSIS — J329 Chronic sinusitis, unspecified: Secondary | ICD-10-CM | POA: Diagnosis not present

## 2017-06-11 MED ORDER — METHYLPREDNISOLONE ACETATE 40 MG/ML IJ SUSP
40.0000 mg | Freq: Once | INTRAMUSCULAR | Status: AC
  Administered 2017-06-11: 40 mg via INTRAMUSCULAR

## 2017-06-11 MED ORDER — PREDNISONE 10 MG PO TABS: ORAL_TABLET | ORAL | 0 refills | Status: DC

## 2017-06-11 NOTE — Progress Notes (Signed)
   Subjective:    Patient ID: William [name], male    DOB: 19-Aug-1966, 51 y.o.   MRN: [id number]  Sinusitis  This is a new problem. Episode onset: 2 months. Associated symptoms include congestion and headaches. Treatments tried: flonase.  cong and stuffiness and dranage  Usually fluticanasw and saline  Using atin and vicks nasal cong  No sig gunkiness    Wants to discuss [place].  Having too much drowsiness with Lorrin Mais, hare time functioning Reg amb in past had stopped working, was having early awakenign      Review of Systems  HENT: Positive for congestion.   Neurological: Positive for headaches.       Objective:   Physical Exam Alert, mild malaise. Hydration good Vitals stable. frontal/ maxillary tenderness evident positive nasal congestion. pharynx normal neck supple  lungs clear/no crackles or wheezes. heart regular in rhythm        Assessment & Plan:  Impression rhinosinusitis likely post viral, discussed with patient. plan antibiotics prescribed. Questions answered. Symptomatic care discussed. warning signs discussed. WSL

## 2017-06-12 ENCOUNTER — Other Ambulatory Visit: Payer: Self-pay | Admitting: Family Medicine

## 2017-06-12 NOTE — Telephone Encounter (Signed)
Actually pt wanted to go bk to reg 10 mg ambien, numb 30 with one qhs five ref

## 2017-06-13 ENCOUNTER — Encounter: Payer: Self-pay | Admitting: Family Medicine

## 2017-06-13 ENCOUNTER — Other Ambulatory Visit: Payer: Self-pay | Admitting: *Deleted

## 2017-06-13 DIAGNOSIS — F411 Generalized anxiety disorder: Secondary | ICD-10-CM | POA: Diagnosis not present

## 2017-06-13 MED ORDER — ZOLPIDEM TARTRATE 10 MG PO TABS
10.0000 mg | ORAL_TABLET | Freq: Every evening | ORAL | 5 refills | Status: DC | PRN
Start: 2017-06-13 — End: 2017-12-30

## 2017-07-11 DIAGNOSIS — F411 Generalized anxiety disorder: Secondary | ICD-10-CM | POA: Diagnosis not present

## 2017-07-19 DIAGNOSIS — S93491A Sprain of other ligament of right ankle, initial encounter: Secondary | ICD-10-CM | POA: Diagnosis not present

## 2017-07-26 DIAGNOSIS — M25532 Pain in left wrist: Secondary | ICD-10-CM | POA: Diagnosis not present

## 2017-08-01 ENCOUNTER — Other Ambulatory Visit: Payer: Self-pay | Admitting: Family Medicine

## 2017-08-01 DIAGNOSIS — R300 Dysuria: Secondary | ICD-10-CM | POA: Diagnosis not present

## 2017-08-15 DIAGNOSIS — F411 Generalized anxiety disorder: Secondary | ICD-10-CM | POA: Diagnosis not present

## 2017-09-05 ENCOUNTER — Other Ambulatory Visit: Payer: Self-pay | Admitting: Family Medicine

## 2017-09-18 DIAGNOSIS — M25512 Pain in left shoulder: Secondary | ICD-10-CM | POA: Diagnosis not present

## 2017-09-18 DIAGNOSIS — Z01812 Encounter for preprocedural laboratory examination: Secondary | ICD-10-CM | POA: Diagnosis not present

## 2017-09-18 DIAGNOSIS — M25532 Pain in left wrist: Secondary | ICD-10-CM | POA: Diagnosis not present

## 2017-09-18 DIAGNOSIS — M25511 Pain in right shoulder: Secondary | ICD-10-CM | POA: Diagnosis not present

## 2017-10-01 DIAGNOSIS — F411 Generalized anxiety disorder: Secondary | ICD-10-CM | POA: Diagnosis not present

## 2017-10-02 ENCOUNTER — Ambulatory Visit: Payer: BLUE CROSS/BLUE SHIELD | Admitting: Family Medicine

## 2017-10-02 ENCOUNTER — Encounter: Payer: Self-pay | Admitting: Family Medicine

## 2017-10-02 VITALS — BP 118/76 | Temp 98.6°F | Ht 73.0 in | Wt 231.0 lb

## 2017-10-02 DIAGNOSIS — J069 Acute upper respiratory infection, unspecified: Secondary | ICD-10-CM

## 2017-10-02 DIAGNOSIS — Z79899 Other long term (current) drug therapy: Secondary | ICD-10-CM | POA: Diagnosis not present

## 2017-10-02 DIAGNOSIS — Z125 Encounter for screening for malignant neoplasm of prostate: Secondary | ICD-10-CM | POA: Diagnosis not present

## 2017-10-02 DIAGNOSIS — E785 Hyperlipidemia, unspecified: Secondary | ICD-10-CM | POA: Diagnosis not present

## 2017-10-02 DIAGNOSIS — H60312 Diffuse otitis externa, left ear: Secondary | ICD-10-CM | POA: Diagnosis not present

## 2017-10-02 MED ORDER — NEOMYCIN-POLYMYXIN-HC 3.5-10000-1 OT SUSP: OTIC | 0 refills | Status: DC

## 2017-10-02 NOTE — Progress Notes (Signed)
   Subjective:    Patient ID: William [name], male    DOB: 06/02/1966, 51 y.o.   MRN: [id number]  Otalgia   There is pain in the left ear. This is a recurrent problem. Episode onset: 2 weeks. Treatments tried: neomycin, polymyxin and hydrocortisone ear drops.   Sinus congestion. HX OF RHINITIS MEDICAMENTOSA  STILL USES CLAR D IN THE AFTERNOON  N LONGER DOING DECPNG spray  Once per mo gets the ext otitis  Stopped up lately   Poss discomfort and tend erness      Review of Systems  HENT: Positive for ear pain.        Objective:   Physical Exam  Alert active good hydration left external ear is slight irritation crustiness inflammation positive nasal congestion pharynx normal lungs clear.  Heart regular rate and rhythm.      Assessment & Plan:  Impression 1 left external otitis discussed local measures discussed  2.  Chronic nasal congestion may use Zyrtec-D as needed  3.  URI several days duration likely viral cold off on antibiotics for now rationale discussed plan follow-up for wellness and chronic time as scheduled

## 2017-10-29 ENCOUNTER — Other Ambulatory Visit: Payer: Self-pay | Admitting: Family Medicine

## 2017-11-01 DIAGNOSIS — F411 Generalized anxiety disorder: Secondary | ICD-10-CM | POA: Diagnosis not present

## 2017-11-05 ENCOUNTER — Ambulatory Visit: Payer: BLUE CROSS/BLUE SHIELD | Admitting: Family Medicine

## 2017-11-05 ENCOUNTER — Encounter: Payer: Self-pay | Admitting: Family Medicine

## 2017-11-05 VITALS — BP 132/90 | Temp 98.4°F | Ht 73.0 in | Wt 223.0 lb

## 2017-11-05 DIAGNOSIS — K5792 Diverticulitis of intestine, part unspecified, without perforation or abscess without bleeding: Secondary | ICD-10-CM | POA: Diagnosis not present

## 2017-11-05 MED ORDER — METRONIDAZOLE 500 MG PO TABS: 500.0000 mg | ORAL_TABLET | Freq: Three times a day (TID) | ORAL | 0 refills | Status: DC

## 2017-11-05 MED ORDER — CIPROFLOXACIN HCL 750 MG PO TABS: 750.0000 mg | ORAL_TABLET | Freq: Two times a day (BID) | ORAL | 0 refills | Status: DC

## 2017-11-05 NOTE — Progress Notes (Signed)
   Subjective:    Patient ID: William [name], male    DOB: 04/14/1966, 52 y.o.   MRN: [id number]  Abdominal Pain  This is a new problem. Episode onset: 10 - 11 days. Associated symptoms include anorexia, diarrhea and vomiting. Associated symptoms comments: Back pain.   Ten days of abd pain  Appetite down  Started with voming and diarrhea   Dim energy   No appet itie   incr gas   vom ghree d ago   On abdx for the past severeal fays with pt taking some leftover cipro  Did incr peanut intake over the past christmas time        Review of Systems  Gastrointestinal: Positive for abdominal pain, anorexia, diarrhea and vomiting.       Objective:   Physical Exam        Assessment & Plan:

## 2017-11-08 ENCOUNTER — Telehealth: Payer: Self-pay | Admitting: Family Medicine

## 2017-11-08 NOTE — Telephone Encounter (Signed)
Patient was seen 1/21 and put on cipro 750 mg for his diverticulitis which the antibiotic is causing a rash all over his back. He is wanting to know if he could take benadryl.

## 2017-11-08 NOTE — Telephone Encounter (Signed)
Discussed with pt. Pt verbalized understanding.  °

## 2017-11-08 NOTE — Telephone Encounter (Signed)
He may take Benadryl but if he is having high he should let us know

## 2017-11-08 NOTE — Telephone Encounter (Signed)
Left message to return call 

## 2017-11-12 ENCOUNTER — Other Ambulatory Visit: Payer: Self-pay | Admitting: Family Medicine

## 2017-11-12 ENCOUNTER — Encounter (HOSPITAL_COMMUNITY): Payer: Self-pay | Admitting: Family Medicine

## 2017-11-12 ENCOUNTER — Emergency Department (HOSPITAL_COMMUNITY): Payer: BLUE CROSS/BLUE SHIELD

## 2017-11-12 ENCOUNTER — Encounter: Payer: Self-pay | Admitting: Family Medicine

## 2017-11-12 ENCOUNTER — Emergency Department (HOSPITAL_COMMUNITY)
Admission: EM | Admit: 2017-11-12 | Discharge: 2017-11-13 | Disposition: A | Discharge status: Home / Self Care | Payer: BLUE CROSS/BLUE SHIELD | Attending: Emergency Medicine | Admitting: Emergency Medicine

## 2017-11-12 DIAGNOSIS — I10 Essential (primary) hypertension: Secondary | ICD-10-CM | POA: Insufficient documentation

## 2017-11-12 DIAGNOSIS — R109 Unspecified abdominal pain: Secondary | ICD-10-CM | POA: Diagnosis not present

## 2017-11-12 DIAGNOSIS — Z87891 Personal history of nicotine dependence: Secondary | ICD-10-CM | POA: Diagnosis not present

## 2017-11-12 DIAGNOSIS — R1032 Left lower quadrant pain: Secondary | ICD-10-CM

## 2017-11-12 DIAGNOSIS — Z79899 Other long term (current) drug therapy: Secondary | ICD-10-CM | POA: Diagnosis not present

## 2017-11-12 DIAGNOSIS — R1110 Vomiting, unspecified: Secondary | ICD-10-CM | POA: Diagnosis not present

## 2017-11-12 DIAGNOSIS — R197 Diarrhea, unspecified: Secondary | ICD-10-CM | POA: Diagnosis not present

## 2017-11-12 LAB — URINALYSIS, ROUTINE W REFLEX MICROSCOPIC
BILIRUBIN URINE: NEGATIVE
Bacteria, UA: NONE SEEN
GLUCOSE, UA: NEGATIVE mg/dL
HGB URINE DIPSTICK: NEGATIVE
Ketones, ur: NEGATIVE mg/dL
NITRITE: NEGATIVE
PROTEIN: NEGATIVE mg/dL
RBC / HPF: NONE SEEN RBC/hpf (ref 0–5)
SPECIFIC GRAVITY, URINE: 1.004 — AB (ref 1.005–1.030)
pH: 6 (ref 5.0–8.0)

## 2017-11-12 LAB — COMPREHENSIVE METABOLIC PANEL
ALK PHOS: 46 U/L (ref 38–126)
ALT: 93 U/L — ABNORMAL HIGH (ref 17–63)
ANION GAP: 7 (ref 5–15)
AST: 66 U/L — ABNORMAL HIGH (ref 15–41)
Albumin: 4.6 g/dL (ref 3.5–5.0)
BILIRUBIN TOTAL: 0.8 mg/dL (ref 0.3–1.2)
BUN: 10 mg/dL (ref 6–20)
CALCIUM: 9.4 mg/dL (ref 8.9–10.3)
CO2: 28 mmol/L (ref 22–32)
Chloride: 107 mmol/L (ref 101–111)
Creatinine, Ser: 1.01 mg/dL (ref 0.61–1.24)
Glucose, Bld: 102 mg/dL — ABNORMAL HIGH (ref 65–99)
Potassium: 3.7 mmol/L (ref 3.5–5.1)
Sodium: 142 mmol/L (ref 135–145)
TOTAL PROTEIN: 7.4 g/dL (ref 6.5–8.1)

## 2017-11-12 LAB — CBC
HCT: 45 % (ref 39.0–52.0)
HEMOGLOBIN: 15.8 g/dL (ref 13.0–17.0)
MCH: 29.5 pg (ref 26.0–34.0)
MCHC: 35.1 g/dL (ref 30.0–36.0)
MCV: 84 fL (ref 78.0–100.0)
Platelets: 212 10*3/uL (ref 150–400)
RBC: 5.36 MIL/uL (ref 4.22–5.81)
RDW: 13 % (ref 11.5–15.5)
WBC: 5.6 10*3/uL (ref 4.0–10.5)

## 2017-11-12 LAB — LIPASE, BLOOD: Lipase: 30 U/L (ref 11–51)

## 2017-11-12 MED ORDER — IOPAMIDOL (ISOVUE-300) INJECTION 61%
100.0000 mL | Freq: Once | INTRAVENOUS | Status: AC | PRN
  Administered 2017-11-12: 100 mL via INTRAVENOUS

## 2017-11-12 MED ORDER — DICYCLOMINE HCL 20 MG PO TABS: 20.0000 mg | ORAL_TABLET | Freq: Two times a day (BID) | ORAL | 0 refills | Status: DC

## 2017-11-12 MED ORDER — TRAMADOL HCL 50 MG PO TABS: 50.0000 mg | ORAL_TABLET | Freq: Four times a day (QID) | ORAL | 0 refills | Status: DC | PRN

## 2017-11-12 MED ORDER — MORPHINE SULFATE (PF) 4 MG/ML IV SOLN
4.0000 mg | Freq: Once | INTRAVENOUS | Status: AC
  Administered 2017-11-12: 4 mg via INTRAVENOUS
  Filled 2017-11-12: qty 1

## 2017-11-12 MED ORDER — SODIUM CHLORIDE 0.9 % IV SOLN
INTRAVENOUS | Status: DC
  Administered 2017-11-12: 23:00:00 via INTRAVENOUS

## 2017-11-12 NOTE — Telephone Encounter (Signed)
Patient advised per Dr Richardson Landry that he should go straight to the ER for evaluation and treatment. Patient verbalized understanding.

## 2017-11-12 NOTE — Discharge Instructions (Signed)
Follow-up with your primary care doctor to discuss further evaluation, complete your course of antibiotics

## 2017-11-12 NOTE — ED Triage Notes (Addendum)
Patient reports he was referred to the emergency department by his PCP for further evaluation for left lower abd pain. Patient was placed on Flagyl and Cipro for diverticulitis last Monday at his PCP office. Today for his revisit, his symptoms had not improved. Patient reports he has lost 15Ibs in 2.5 weeks. Reports diarrhea, lower back pain, and one episode of vomiting last week. Denies any fever or rectal bleeding.

## 2017-11-12 NOTE — ED Provider Notes (Signed)
[facility] DEPT Provider Note   CSN: [id number] Arrival date & time: 11/12/17  1440     History   Chief Complaint Chief Complaint  Patient presents with  . Abdominal Pain    HPI William [name] is a 52 y.o. male.  HPI Patient presents to the emergency room for evaluation of persistent abdominal pain.  Patient has a history of diverticulitis.  He started having abdominal pain earlier this week.  His doctor started him empirically on antibiotics.  Patient does not feel like he is getting any better.  He continues to have pain in his left lower quadrant and its increasing in severity.  Patient has had some loose stools.  He had one episode of vomiting last week.  No fevers. Past Medical History:  Diagnosis Date  . Difficulty sleeping    TAKES ADVIL PM  . Diverticulitis of intestine with perforation and abscess   . Diverticulosis   . Dysuria   . ED (erectile dysfunction)   . GERD (gastroesophageal reflux disease)   . Hyperlipidemia   . Pancreatitis, acute   . PONV (postoperative nausea and vomiting)   . Prostatitis   . Reflux     Patient Active Problem List   Diagnosis Date Noted  . Hypertension 03/13/2017  . Chronic pain of both shoulders 09/13/2016  . Prostate hypertrophy 04/21/2016  . Depression 07/22/2015  . Generalized anxiety disorder 07/22/2015  . Special screening for malignant neoplasms, colon   . Insomnia 05/18/2015  . Hyperlipidemia LDL goal <130 12/08/2014  . Postoperative abdominal pain 11/22/2013  . Diverticulitis of sigmoid colon 11/17/2013  . Intra-abdominal abscess ([place]) 10/08/2013  . Diverticulitis with perforation 10/08/2013  . Diverticulitis of colon with perforation 09/22/2013  . Other and unspecified hyperlipidemia 01/09/2013  . Gastro-esophageal reflux 01/09/2013  . Epididymitis 01/09/2013    Past Surgical History:  Procedure Laterality Date  . ABCESS DRAINAGE     ABDOMINAL  . APPENDECTOMY    .  CHOLECYSTECTOMY    . COLONOSCOPY N/A 06/09/2015   Procedure: COLONOSCOPY;  Surgeon: Daneil Dolin, MD;  Location: [facility];  Service: Endoscopy;  Laterality: N/A;  11:30 Am  . HERNIA REPAIR    . KNEE SURGERY     Both  . LAPAROSCOPIC SIGMOID COLECTOMY N/A 11/17/2013   Procedure: LAPAROSCOPIC SIGMOID COLECTOMY rigid proctoscopy;  Surgeon: Edward Jolly, MD;  Location: [facility];  Service: General;  Laterality: N/A;  . SHOULDER SURGERY Left May 2016  . SPHINCTEROTOMY     for sphincter of Oddi dysfunction  . TONSILLECTOMY         Home Medications    Prior to Admission medications   Medication Sig Start Date End Date Taking? Authorizing Provider  acetaminophen (TYLENOL) 500 MG tablet Take 1,000 mg by mouth every 6 (six) hours as needed for moderate pain.   Yes [provider]  ciprofloxacin (CIPRO) 750 MG tablet Take 1 tablet (750 mg total) by mouth 2 (two) times daily. 11/05/17  Yes Mikey Kirschner, MD  fluticasone (FLONASE) 50 MCG/ACT nasal spray Place 2 sprays into both nostrils daily. 02/08/17  Yes Mikey Kirschner, MD  lisinopril (PRINIVIL,ZESTRIL) 5 MG tablet TAKE 1 TABLET (5 MG TOTAL) BY MOUTH AT BEDTIME. 09/05/17  Yes Mikey Kirschner, MD  lovastatin (MEVACOR) 40 MG tablet TAKE 1 TABLET BY MOUTH EVERYDAY AT BEDTIME Patient taking differently: Take 40 mg by mouth at bedtime.  11/12/17  Yes Mikey Kirschner, MD  metroNIDAZOLE (FLAGYL) 500 MG tablet  Take 1 tablet (500 mg total) by mouth 3 (three) times daily. 11/05/17  Yes Mikey Kirschner, MD  Multiple Vitamins-Minerals (ALIVE MENS ENERGY PO) Take 1 tablet by mouth daily.   Yes [provider]  neomycin-polymyxin-hydrocortisone (CORTISPORIN) 3.5-10000-1 OTIC suspension Place 3 drops in the affected ear four times daily 10/02/17  Yes Mikey Kirschner, MD  omeprazole (PRILOSEC) 40 MG capsule TAKE 1 CAPSULE BY MOUTH EVERY DAY Patient taking differently: Take 40mg  by mouth daily 10/29/17  Yes Luking, Elayne Snare, MD    zolpidem (AMBIEN) 10 MG tablet Take 1 tablet (10 mg total) by mouth at bedtime as needed for sleep. 06/13/17 11/12/17 Yes Mikey Kirschner, MD  dicyclomine (BENTYL) 20 MG tablet Take 1 tablet (20 mg total) by mouth 2 (two) times daily. 11/12/17   Dorie Rank, MD  tadalafil (CIALIS) 20 MG tablet Take 1 tablet (20 mg total) by mouth daily as needed for erectile dysfunction. Patient not taking: Reported on 11/12/2017 01/18/17   Mikey Kirschner, MD  traMADol (ULTRAM) 50 MG tablet Take 1 tablet (50 mg total) by mouth every 6 (six) hours as needed. 11/12/17   Dorie Rank, MD    Family History Family History  Problem Relation Age of Onset  . Cancer Father        Colon  . Cancer Other   . Diverticulitis Other     Social History Social History   Tobacco Use  . Smoking status: Former Smoker    Last attempt to quit: 11/10/1986    Years since quitting: 31.0  . Smokeless tobacco: Never Used  Substance Use Topics  . Alcohol use: Yes    Alcohol/week: 0.6 oz    Types: 1 Glasses of wine per week    Comment: 2-3 glasses a week.   . Drug use: No     Allergies   Biaxin [clarithromycin] and Other   Review of Systems Review of Systems  All other systems reviewed and are negative.    Physical Exam Updated Vital Signs BP (!) 141/92 (BP Location: Right Arm)   Pulse 70   Temp 98.4 F (36.9 C) (Oral)   Resp 16   Ht 1.854 m (6\' 1" )   Wt 97.5 kg (215 lb)   SpO2 97%   BMI 28.37 kg/m   Physical Exam  Constitutional: He appears well-developed and well-nourished. No distress.  HENT:  Head: Normocephalic and atraumatic.  Right Ear: External ear normal.  Left Ear: External ear normal.  Eyes: Conjunctivae are normal. Right eye exhibits no discharge. Left eye exhibits no discharge. No scleral icterus.  Neck: Neck supple. No tracheal deviation present.  Cardiovascular: Normal rate, regular rhythm and intact distal pulses.  Pulmonary/Chest: Effort normal and breath sounds normal. No stridor. No  respiratory distress. He has no wheezes. He has no rales.  Abdominal: Soft. Bowel sounds are normal. He exhibits no distension. There is tenderness in the left lower quadrant. There is no rebound and no guarding. No hernia.  Musculoskeletal: He exhibits no edema or tenderness.  Neurological: He is alert. He has normal strength. No cranial nerve deficit (no facial droop, extraocular movements intact, no slurred speech) or sensory deficit. He exhibits normal muscle tone. He displays no seizure activity. Coordination normal.  Skin: Skin is warm and dry. No rash noted.  Psychiatric: He has a normal mood and affect.  Nursing note and vitals reviewed.    ED Treatments / Results  Labs (all labs ordered are listed, but only abnormal results  are displayed) Labs Reviewed  COMPREHENSIVE METABOLIC PANEL - Abnormal; Notable for the following components:      Result Value   Glucose, Bld 102 (*)    AST 66 (*)    ALT 93 (*)    All other components within normal limits  URINALYSIS, ROUTINE W REFLEX MICROSCOPIC - Abnormal; Notable for the following components:   Specific Gravity, Urine 1.004 (*)    Leukocytes, UA TRACE (*)    Squamous Epithelial / LPF 0-5 (*)    All other components within normal limits  LIPASE, BLOOD  CBC     Radiology Ct Abdomen Pelvis W Contrast  Result Date: 11/12/2017 CLINICAL DATA:  Left lower abdominal pain. Recent weight loss. Diarrhea, low back pain and 1 episode of vomiting last week. EXAM: CT ABDOMEN AND PELVIS WITH CONTRAST TECHNIQUE: Multidetector CT imaging of the abdomen and pelvis was performed using the standard protocol following bolus administration of intravenous contrast. CONTRAST:  171mL ISOVUE-300 IOPAMIDOL (ISOVUE-300) INJECTION 61% COMPARISON:  CT abdomen dated 07/06/2016. FINDINGS: Lower chest: No acute abnormality. Hepatobiliary: No focal liver abnormality is seen. Status post cholecystectomy. No biliary dilatation. Pancreas: Unremarkable. No pancreatic  ductal dilatation or surrounding inflammatory changes. Spleen: Normal in size without focal abnormality. Adrenals/Urinary Tract: Adrenal glands are unremarkable. Kidneys are normal, without renal calculi, focal lesion, or hydronephrosis. Bladder is unremarkable. Stomach/Bowel: Bowel is normal in caliber. Surgical anastomosis within the sigmoid colon, without obstruction or other complicating feature. No bowel wall thickening or evidence of bowel wall inflammation seen. Stomach is unremarkable. Status post appendectomy Vascular/Lymphatic: No significant vascular findings are present. No enlarged abdominal or pelvic lymph nodes. Reproductive: Prostate is mildly prominent. Other: No free fluid or abscess collection. No free intraperitoneal air. Musculoskeletal: Mild degenerative spurring within the lumbar spine. No acute or suspicious osseous finding. Small bilateral inguinal hernias which contain fat only. IMPRESSION: 1. No acute findings within the abdomen or pelvis. No bowel obstruction or evidence of bowel wall inflammation. No renal or ureteral calculi. No evidence of acute solid organ abnormality. No free fluid. 2. Surgical anastomosis within the sigmoid colon. No obstruction, inflammation or other surgical complicating feature in this area. 3. Prostate gland is mildly prominent in size. Recommend correlation with PSA lab values. Electronically Signed   By: Franki Cabot M.D.   On: 11/12/2017 23:31    Procedures Procedures (including critical care time)  Medications Ordered in ED Medications  0.9 %  sodium chloride infusion ( Intravenous New Bag/Given 11/12/17 2237)  morphine 4 MG/ML injection 4 mg (4 mg Intravenous Given 11/12/17 2238)  iopamidol (ISOVUE-300) 61 % injection 100 mL (100 mLs Intravenous Contrast Given 11/12/17 2310)     Initial Impression / Assessment and Plan / ED Course  I have reviewed the triage vital signs and the nursing notes.  Pertinent labs & imaging results that were  available during my care of the patient were reviewed by me and considered in my medical decision making (see chart for details).  Clinical Course as of Jan 29 0000  Mon Nov 12, 2017  2358 [place] database.  Regular zolpidem rx.  No opiate rx  [JK]    Clinical Course User Index [JK] Dorie Rank, MD   Patient presented to the emergency room for evaluation of possible diverticulitis.  Patient was started empirically on antibiotics by his primary care doctor.  His symptoms were not improving.  Patient's laboratory tests are normal.  His CT scan does not show any acute findings to account for his  pain.  Patient is stable for discharge.  Discussed outpatient follow-up with his primary care doctor.  Final Clinical Impressions(s) / ED Diagnoses   Final diagnoses:  Left lower quadrant pain    ED Discharge Orders        Ordered    traMADol (ULTRAM) 50 MG tablet  Every 6 hours PRN     11/12/17 2359    dicyclomine (BENTYL) 20 MG tablet  2 times daily     11/12/17 2359       Dorie Rank, MD 11/13/17 0000

## 2017-11-26 ENCOUNTER — Encounter: Payer: Self-pay | Admitting: Family Medicine

## 2017-11-26 DIAGNOSIS — R109 Unspecified abdominal pain: Secondary | ICD-10-CM

## 2017-12-06 ENCOUNTER — Ambulatory Visit: Payer: BLUE CROSS/BLUE SHIELD | Admitting: Family Medicine

## 2017-12-06 ENCOUNTER — Encounter: Payer: Self-pay | Admitting: Family Medicine

## 2017-12-06 VITALS — BP 136/100 | Temp 98.2°F | Ht 73.0 in | Wt 224.0 lb

## 2017-12-06 DIAGNOSIS — N410 Acute prostatitis: Secondary | ICD-10-CM | POA: Diagnosis not present

## 2017-12-06 DIAGNOSIS — G8929 Other chronic pain: Secondary | ICD-10-CM | POA: Diagnosis not present

## 2017-12-06 DIAGNOSIS — R109 Unspecified abdominal pain: Secondary | ICD-10-CM

## 2017-12-06 DIAGNOSIS — R300 Dysuria: Secondary | ICD-10-CM

## 2017-12-06 LAB — POCT URINALYSIS DIPSTICK
Blood, UA: NEGATIVE
LEUKOCYTES UA: NEGATIVE
Spec Grav, UA: 1.02 (ref 1.010–1.025)
pH, UA: 6 (ref 5.0–8.0)

## 2017-12-06 MED ORDER — DOXYCYCLINE HYCLATE 100 MG PO TABS: 100.0000 mg | ORAL_TABLET | Freq: Two times a day (BID) | ORAL | 0 refills | Status: DC

## 2017-12-06 MED ORDER — LISINOPRIL 10 MG PO TABS: ORAL_TABLET | ORAL | 1 refills | Status: DC

## 2017-12-06 NOTE — Progress Notes (Signed)
   Subjective:    Patient ID: William [name], male    DOB: [birth year], 52 y.o.   MRN: [id number]  HPI Patient is here today with complaints of lower abd pain.Went to the Ed per our suggestion and they did a ct scan showed no diverticulitis,but showed an enlarged prostate.Pain is keeping him up at night and taking Advil BID and using heating pad.   Seen in thr emergency room 22 days ago with bad llq pain, had scan at that time, no evidence of diverticulitis  Complete recent history reviewed with patient.  Recent workup in the emergency room reviewed in the presence of the patient.  Along with results of scan this is assessed plus evaluated and discussed with patient  Patient states his pain is now moved from left lower quadrant to suprapubic.  Some slight urinary hesitancy worsening.  Some slight dysuria  History of prostate infection    seing   Review of Systems No headache, no major weight loss or weight gain, no chest pain no back pain abdominal pain no change in bowel habits complete ROS otherwise negative     Results for orders placed or performed in visit on 12/06/17  POCT urinalysis dipstick  Result Value Ref Range   Color, UA     Clarity, UA     Glucose, UA     Bilirubin, UA     Ketones, UA     Spec Grav, UA 1.020 1.010 - 1.025   Blood, UA Negative    pH, UA 6.0 5.0 - 8.0   Protein, UA     Urobilinogen, UA  0.2 or 1.0 E.U./dL   Nitrite, UA     Leukocytes, UA Negative Negative   Appearance     Odor      Objective:   Physical Exam  Alert and oriented, vitals reviewed and stable, NAD ENT-TM's and ext canals WNL bilat via otoscopic exam Soft palate, tonsils and post pharynx WNL via oropharyngeal exam Neck-symmetric, no masses; thyroid nonpalpable and nontender Pulmonary-no tachypnea or accessory muscle use; Clear without wheezes via auscultation Card--no abnrml murmurs, rhythm reg and rate WNL Carotid pulses symmetric, without bruits Abdominal exam mild  suprapubic left lower quadrant tenderness gentle exam no active herniaProstate distinctly boggy and tender      Assessment & Plan:  Impression acute prostatitis discussed at length will intervene with doxycycline recently was on Cipro  2.  Chronic and acute abdominal pain.  Definitely agree with present all GI evaluation.  Patient prefers [facility] physician so I have on her data we will press on  Doxycycline twice daily 21 days warning signs discussed  Greater than 50% of this 25 minute face to face visit was spent in counseling and discussion and coordination of care regarding the above diagnosis/diagnosies

## 2017-12-07 ENCOUNTER — Other Ambulatory Visit: Payer: Self-pay | Admitting: *Deleted

## 2017-12-07 MED ORDER — FLUTICASONE PROPIONATE 50 MCG/ACT NA SUSP: 2.0000 | Freq: Every day | NASAL | 0 refills | Status: DC

## 2017-12-10 ENCOUNTER — Other Ambulatory Visit: Payer: Self-pay | Admitting: *Deleted

## 2017-12-10 MED ORDER — FLUTICASONE PROPIONATE 50 MCG/ACT NA SUSP: 2.0000 | Freq: Every day | NASAL | 0 refills | Status: DC

## 2017-12-17 DIAGNOSIS — M25511 Pain in right shoulder: Secondary | ICD-10-CM | POA: Diagnosis not present

## 2017-12-17 DIAGNOSIS — M25512 Pain in left shoulder: Secondary | ICD-10-CM | POA: Diagnosis not present

## 2017-12-18 ENCOUNTER — Encounter: Payer: Self-pay | Admitting: Family Medicine

## 2017-12-18 MED ORDER — CIPROFLOXACIN HCL 500 MG PO TABS: 500.0000 mg | ORAL_TABLET | Freq: Two times a day (BID) | ORAL | 0 refills | Status: DC

## 2017-12-30 ENCOUNTER — Other Ambulatory Visit: Payer: Self-pay | Admitting: Family Medicine

## 2017-12-31 NOTE — Telephone Encounter (Signed)
Six mo worth 

## 2018-01-20 ENCOUNTER — Other Ambulatory Visit: Payer: Self-pay | Admitting: Family Medicine

## 2018-01-22 ENCOUNTER — Ambulatory Visit: Payer: BLUE CROSS/BLUE SHIELD | Admitting: Family Medicine

## 2018-01-22 VITALS — BP 122/76 | Temp 99.0°F | Ht 73.0 in | Wt 226.0 lb

## 2018-01-22 DIAGNOSIS — R350 Frequency of micturition: Secondary | ICD-10-CM | POA: Diagnosis not present

## 2018-01-22 DIAGNOSIS — R109 Unspecified abdominal pain: Secondary | ICD-10-CM | POA: Diagnosis not present

## 2018-01-22 DIAGNOSIS — N410 Acute prostatitis: Secondary | ICD-10-CM | POA: Diagnosis not present

## 2018-01-22 LAB — POCT URINALYSIS DIPSTICK
PH UA: 5 (ref 5.0–8.0)
Spec Grav, UA: 1.02 (ref 1.010–1.025)

## 2018-01-22 MED ORDER — TAMSULOSIN HCL 0.4 MG PO CAPS: 0.4000 mg | ORAL_CAPSULE | Freq: Every day | ORAL | 6 refills | Status: DC

## 2018-01-22 MED ORDER — CIPROFLOXACIN HCL 500 MG PO TABS: 500.0000 mg | ORAL_TABLET | Freq: Two times a day (BID) | ORAL | 0 refills | Status: DC

## 2018-01-22 NOTE — Progress Notes (Signed)
   Subjective:    Patient ID: William [name], male    DOB: 05-07-1966, 52 y.o.   MRN: [id number]  Abdominal Pain  This is a recurrent problem. The current episode started more than 1 month ago. The abdominal pain radiates to the LLQ. Associated symptoms comments: Frequent urination. Treatments tried: cipro, doxy.   Results for orders placed or performed in visit on 01/22/18  POCT urinalysis dipstick  Result Value Ref Range   Color, UA     Clarity, UA     Glucose, UA     Bilirubin, UA     Ketones, UA     Spec Grav, UA 1.020 1.010 - 1.025   Blood, UA     pH, UA 5.0 5.0 - 8.0   Protein, UA     Urobilinogen, UA  0.2 or 1.0 E.U./dL   Nitrite, UA     Leukocytes, UA  Negative   Appearance     Odor     Pt notes llq pain overall bette  Did not go to [facility] doc yet, had to delay appt, and now waiting months moe  Pt has had some buringing an dsicomfotrt and iritation   Increased stream freq up three ot four times per night   cipro seemed to help with the last    flomax seemed to help in the past     Review of Systems  Gastrointestinal: Positive for abdominal pain.       Objective:   Physical Exam  Alert and oriented, vitals reviewed and stable, NAD ENT-TM's and ext canals WNL bilat via otoscopic exam Soft palate, tonsils and post pharynx WNL via oropharyngeal exam Neck-symmetric, no masses; thyroid nonpalpable and nontender Pulmonary-no tachypnea or accessory muscle use; Clear without wheezes via auscultation Card--no abnrml murmurs, rhythm reg and rate WNL Carotid pulses symmetric, without bruits Testingabdomen soft no discrete tenderness excellent bowel sounds exam normal.  Prostate boggy tender enlarged  Urinalysis unremarkable    Assessment & Plan:  Impression acute prostatitis discussed at length.  Potential options discussed.  We will go with 30 days of Cipro.  Plus resume Flomax.  Referral to GI still pending.  Patient is complicated with history of recurrent  diverticulitis however this presents as highly likely acute prostatitis discussed at length   Patient had questions also about stem cell infusion for chronic shoulder pain facing probable shoulder replacement.  Currently not very impressive discussed   Greater than 50% of this 25 minute face to face visit was spent in counseling and discussion and coordination of care regarding the above diagnosis/diagnosies

## 2018-01-23 DIAGNOSIS — D485 Neoplasm of uncertain behavior of skin: Secondary | ICD-10-CM | POA: Diagnosis not present

## 2018-01-23 DIAGNOSIS — C4441 Basal cell carcinoma of skin of scalp and neck: Secondary | ICD-10-CM | POA: Diagnosis not present

## 2018-01-23 DIAGNOSIS — C44319 Basal cell carcinoma of skin of other parts of face: Secondary | ICD-10-CM | POA: Diagnosis not present

## 2018-01-23 DIAGNOSIS — Z85828 Personal history of other malignant neoplasm of skin: Secondary | ICD-10-CM | POA: Diagnosis not present

## 2018-01-23 DIAGNOSIS — L578 Other skin changes due to chronic exposure to nonionizing radiation: Secondary | ICD-10-CM | POA: Diagnosis not present

## 2018-01-23 DIAGNOSIS — L570 Actinic keratosis: Secondary | ICD-10-CM | POA: Diagnosis not present

## 2018-01-28 ENCOUNTER — Encounter: Payer: Self-pay | Admitting: Family Medicine

## 2018-01-28 NOTE — Telephone Encounter (Signed)
Called and discussed with pt. flomax added to allergy list for med sensitivity. Pt transferred to front to schedule office visit with dr Richardson Landry.

## 2018-01-29 ENCOUNTER — Encounter: Payer: Self-pay | Admitting: Family Medicine

## 2018-01-29 ENCOUNTER — Ambulatory Visit: Payer: BLUE CROSS/BLUE SHIELD | Admitting: Family Medicine

## 2018-01-29 VITALS — BP 112/86 | Ht 73.0 in | Wt 227.0 lb

## 2018-01-29 DIAGNOSIS — N400 Enlarged prostate without lower urinary tract symptoms: Secondary | ICD-10-CM | POA: Diagnosis not present

## 2018-01-29 MED ORDER — FINASTERIDE 5 MG PO TABS: 5.0000 mg | ORAL_TABLET | Freq: Every day | ORAL | 3 refills | Status: DC

## 2018-01-29 NOTE — Progress Notes (Signed)
   Subjective:    Patient ID: William [name], male    DOB: 17-Apr-1966, 52 y.o.   MRN: [id number]  HPI  Patient states he thinks he is allergic to the flomax as he states he is having severe head congestion and dizziness.   Fl    Felt nasal cong from the flomax    And sig ortho static hypotension with the meds  Pt getting up couple times per night wen not having a prostate infxn  Also challenges during day with freq urinatioin          Last took yesterday am.  Review of Systems No headache, no major weight loss or weight gain, no chest pain no back pain abdominal pain no change in bowel habits complete ROS otherwise negative     Objective:   Physical Exam  Alert vitals stable, NAD. Blood pressure good on repeat. HEENT normal. Lungs clear. Heart regular rate and rhythm.       Assessment & Plan:  Impression prostate hypertrophy.  Increasing challenges with symptomatology.  Couple times each night challenges during the day also.  Has not done blood work encouraged to get this done after prostatitis over.  Cannot handle Flomax due to side effects.  Benefits discussed of L4 reductase inhibitors will initiate Proscar rationale discussed

## 2018-03-01 DIAGNOSIS — L918 Other hypertrophic disorders of the skin: Secondary | ICD-10-CM | POA: Diagnosis not present

## 2018-03-01 DIAGNOSIS — L738 Other specified follicular disorders: Secondary | ICD-10-CM | POA: Diagnosis not present

## 2018-03-01 DIAGNOSIS — L821 Other seborrheic keratosis: Secondary | ICD-10-CM | POA: Diagnosis not present

## 2018-03-01 DIAGNOSIS — L570 Actinic keratosis: Secondary | ICD-10-CM | POA: Diagnosis not present

## 2018-03-05 DIAGNOSIS — M25562 Pain in left knee: Secondary | ICD-10-CM | POA: Diagnosis not present

## 2018-03-12 DIAGNOSIS — M25562 Pain in left knee: Secondary | ICD-10-CM | POA: Diagnosis not present

## 2018-03-13 DIAGNOSIS — C44319 Basal cell carcinoma of skin of other parts of face: Secondary | ICD-10-CM | POA: Diagnosis not present

## 2018-03-18 DIAGNOSIS — M7711 Lateral epicondylitis, right elbow: Secondary | ICD-10-CM | POA: Diagnosis not present

## 2018-03-21 DIAGNOSIS — Z79899 Other long term (current) drug therapy: Secondary | ICD-10-CM | POA: Diagnosis not present

## 2018-03-21 DIAGNOSIS — Z125 Encounter for screening for malignant neoplasm of prostate: Secondary | ICD-10-CM | POA: Diagnosis not present

## 2018-03-21 DIAGNOSIS — E785 Hyperlipidemia, unspecified: Secondary | ICD-10-CM | POA: Diagnosis not present

## 2018-03-22 LAB — BASIC METABOLIC PANEL
BUN / CREAT RATIO: 37 — AB (ref 9–20)
BUN: 26 mg/dL — ABNORMAL HIGH (ref 6–24)
CHLORIDE: 103 mmol/L (ref 96–106)
CO2: 22 mmol/L (ref 20–29)
Calcium: 9.7 mg/dL (ref 8.7–10.2)
Creatinine, Ser: 0.71 mg/dL — ABNORMAL LOW (ref 0.76–1.27)
GFR calc Af Amer: 123 mL/min/{1.73_m2} (ref >59)
GFR calc non Af Amer: 106 mL/min/{1.73_m2} (ref >59)
GLUCOSE: 96 mg/dL (ref 65–99)
Potassium: 4.4 mmol/L (ref 3.5–5.2)
SODIUM: 141 mmol/L (ref 134–144)

## 2018-03-22 LAB — LIPID PANEL
CHOL/HDL RATIO: 3.3 ratio (ref 0.0–5.0)
Cholesterol, Total: 169 mg/dL (ref 100–199)
HDL: 51 mg/dL (ref >39)
LDL Calculated: 105 mg/dL — ABNORMAL HIGH (ref 0–99)
TRIGLYCERIDES: 65 mg/dL (ref 0–149)
VLDL CHOLESTEROL CAL: 13 mg/dL (ref 5–40)

## 2018-03-22 LAB — HEPATIC FUNCTION PANEL
ALK PHOS: 63 IU/L (ref 39–117)
ALT: 53 IU/L — AB (ref 0–44)
AST: 30 IU/L (ref 0–40)
Albumin: 4.8 g/dL (ref 3.5–5.5)
Bilirubin Total: 0.3 mg/dL (ref 0.0–1.2)
Bilirubin, Direct: 0.11 mg/dL (ref 0.00–0.40)
Total Protein: 7.1 g/dL (ref 6.0–8.5)

## 2018-03-22 LAB — PSA: PROSTATE SPECIFIC AG, SERUM: 1.1 ng/mL (ref 0.0–4.0)

## 2018-03-24 ENCOUNTER — Encounter: Payer: Self-pay | Admitting: Family Medicine

## 2018-04-06 ENCOUNTER — Other Ambulatory Visit: Payer: Self-pay | Admitting: Family Medicine

## 2018-04-15 ENCOUNTER — Other Ambulatory Visit: Payer: Self-pay | Admitting: Family Medicine

## 2018-04-17 DIAGNOSIS — F411 Generalized anxiety disorder: Secondary | ICD-10-CM | POA: Diagnosis not present

## 2018-04-22 ENCOUNTER — Encounter: Payer: Self-pay | Admitting: Family Medicine

## 2018-04-22 ENCOUNTER — Ambulatory Visit: Payer: BLUE CROSS/BLUE SHIELD | Admitting: Family Medicine

## 2018-04-22 VITALS — BP 118/90 | Temp 98.2°F | Ht 73.0 in | Wt 223.0 lb

## 2018-04-22 DIAGNOSIS — J329 Chronic sinusitis, unspecified: Secondary | ICD-10-CM | POA: Diagnosis not present

## 2018-04-22 MED ORDER — AMOXICILLIN 500 MG PO TABS: ORAL_TABLET | ORAL | 0 refills | Status: DC

## 2018-04-22 MED ORDER — PREDNISONE 20 MG PO TABS: ORAL_TABLET | ORAL | 0 refills | Status: DC

## 2018-04-22 NOTE — Progress Notes (Signed)
   Subjective:    Patient ID: William [name], male    DOB: [birth year], 52 y.o.   MRN: [id number]  HPI Patient is here today with complaints of nasal congestion for weeks, also sneezing,headache,runny nose,eyes running water.He states the Flomax caused it. He is taking zyrtec d and flonase.   Pos diverticulitis much better now, after the rx felt reallyy well  Pos nasal cog with flomax, using fluticasone, and got off vicks nasal spray   Pt noted some improvement after the lflomax started, ongoing nasal cong     Shooting pain in the rectum,  Severe in nature, transietn,  suddenly hit hard, bowels good ovdrall occa Review of Systems No headache no cough no fever    Objective:   Physical Exam  Alert active good hydration positive substantial nasal congestion TMs good pharynx normal neck supple.  Lungs clear.  Heart regular rate and rhythm.      Assessment & Plan:  Impression rhinitis medicamentosa.  Discussed at length.  Prednisone taper.  Flonase.  Will cover with antibiotics for potential rhinosinusitis component

## 2018-04-24 DIAGNOSIS — M94262 Chondromalacia, left knee: Secondary | ICD-10-CM | POA: Diagnosis not present

## 2018-04-24 DIAGNOSIS — S83242A Other tear of medial meniscus, current injury, left knee, initial encounter: Secondary | ICD-10-CM | POA: Diagnosis not present

## 2018-04-24 DIAGNOSIS — G8918 Other acute postprocedural pain: Secondary | ICD-10-CM | POA: Diagnosis not present

## 2018-04-24 DIAGNOSIS — S83232A Complex tear of medial meniscus, current injury, left knee, initial encounter: Secondary | ICD-10-CM | POA: Diagnosis not present

## 2018-04-29 ENCOUNTER — Ambulatory Visit (HOSPITAL_COMMUNITY)
Admission: RE | Admit: 2018-04-29 | Discharge: 2018-04-29 | Disposition: A | Discharge status: Home / Self Care | Payer: BLUE CROSS/BLUE SHIELD | Source: Ambulatory Visit | Attending: Family Medicine | Admitting: Family Medicine

## 2018-04-29 ENCOUNTER — Other Ambulatory Visit (HOSPITAL_COMMUNITY): Payer: Self-pay | Admitting: Orthopedic Surgery

## 2018-04-29 DIAGNOSIS — M7989 Other specified soft tissue disorders: Principal | ICD-10-CM

## 2018-04-29 DIAGNOSIS — M79605 Pain in left leg: Secondary | ICD-10-CM | POA: Diagnosis not present

## 2018-04-29 DIAGNOSIS — R262 Difficulty in walking, not elsewhere classified: Secondary | ICD-10-CM | POA: Diagnosis not present

## 2018-04-29 DIAGNOSIS — M6281 Muscle weakness (generalized): Secondary | ICD-10-CM | POA: Diagnosis not present

## 2018-04-29 DIAGNOSIS — M25662 Stiffness of left knee, not elsewhere classified: Secondary | ICD-10-CM | POA: Diagnosis not present

## 2018-04-29 DIAGNOSIS — M25562 Pain in left knee: Secondary | ICD-10-CM | POA: Diagnosis not present

## 2018-04-29 NOTE — Progress Notes (Signed)
*  Preliminary Results* Left lower extremity venous duplex completed. Left lower extremity is negative for deep vein thrombosis. There is no evidence of left Baker's cyst.  04/29/2018 4:22 PM  Jinny Blossom Dawna Part

## 2018-05-02 DIAGNOSIS — M6281 Muscle weakness (generalized): Secondary | ICD-10-CM | POA: Diagnosis not present

## 2018-05-02 DIAGNOSIS — M25562 Pain in left knee: Secondary | ICD-10-CM | POA: Diagnosis not present

## 2018-05-02 DIAGNOSIS — R262 Difficulty in walking, not elsewhere classified: Secondary | ICD-10-CM | POA: Diagnosis not present

## 2018-05-02 DIAGNOSIS — M25662 Stiffness of left knee, not elsewhere classified: Secondary | ICD-10-CM | POA: Diagnosis not present

## 2018-05-06 DIAGNOSIS — M25562 Pain in left knee: Secondary | ICD-10-CM | POA: Diagnosis not present

## 2018-05-07 DIAGNOSIS — M6281 Muscle weakness (generalized): Secondary | ICD-10-CM | POA: Diagnosis not present

## 2018-05-07 DIAGNOSIS — R262 Difficulty in walking, not elsewhere classified: Secondary | ICD-10-CM | POA: Diagnosis not present

## 2018-05-07 DIAGNOSIS — M25662 Stiffness of left knee, not elsewhere classified: Secondary | ICD-10-CM | POA: Diagnosis not present

## 2018-05-07 DIAGNOSIS — M25562 Pain in left knee: Secondary | ICD-10-CM | POA: Diagnosis not present

## 2018-05-09 DIAGNOSIS — M6281 Muscle weakness (generalized): Secondary | ICD-10-CM | POA: Diagnosis not present

## 2018-05-09 DIAGNOSIS — M25662 Stiffness of left knee, not elsewhere classified: Secondary | ICD-10-CM | POA: Diagnosis not present

## 2018-05-09 DIAGNOSIS — M7711 Lateral epicondylitis, right elbow: Secondary | ICD-10-CM | POA: Diagnosis not present

## 2018-05-09 DIAGNOSIS — M25521 Pain in right elbow: Secondary | ICD-10-CM | POA: Diagnosis not present

## 2018-05-09 DIAGNOSIS — R262 Difficulty in walking, not elsewhere classified: Secondary | ICD-10-CM | POA: Diagnosis not present

## 2018-05-09 DIAGNOSIS — M25562 Pain in left knee: Secondary | ICD-10-CM | POA: Diagnosis not present

## 2018-05-13 DIAGNOSIS — M6281 Muscle weakness (generalized): Secondary | ICD-10-CM | POA: Diagnosis not present

## 2018-05-13 DIAGNOSIS — M25562 Pain in left knee: Secondary | ICD-10-CM | POA: Diagnosis not present

## 2018-05-13 DIAGNOSIS — M25662 Stiffness of left knee, not elsewhere classified: Secondary | ICD-10-CM | POA: Diagnosis not present

## 2018-05-13 DIAGNOSIS — R262 Difficulty in walking, not elsewhere classified: Secondary | ICD-10-CM | POA: Diagnosis not present

## 2018-05-13 DIAGNOSIS — M7711 Lateral epicondylitis, right elbow: Secondary | ICD-10-CM | POA: Diagnosis not present

## 2018-05-16 DIAGNOSIS — M25562 Pain in left knee: Secondary | ICD-10-CM | POA: Diagnosis not present

## 2018-05-16 DIAGNOSIS — R262 Difficulty in walking, not elsewhere classified: Secondary | ICD-10-CM | POA: Diagnosis not present

## 2018-05-16 DIAGNOSIS — M6281 Muscle weakness (generalized): Secondary | ICD-10-CM | POA: Diagnosis not present

## 2018-05-16 DIAGNOSIS — M25662 Stiffness of left knee, not elsewhere classified: Secondary | ICD-10-CM | POA: Diagnosis not present

## 2018-05-20 DIAGNOSIS — M6281 Muscle weakness (generalized): Secondary | ICD-10-CM | POA: Diagnosis not present

## 2018-05-20 DIAGNOSIS — R262 Difficulty in walking, not elsewhere classified: Secondary | ICD-10-CM | POA: Diagnosis not present

## 2018-05-20 DIAGNOSIS — M25662 Stiffness of left knee, not elsewhere classified: Secondary | ICD-10-CM | POA: Diagnosis not present

## 2018-05-20 DIAGNOSIS — M25562 Pain in left knee: Secondary | ICD-10-CM | POA: Diagnosis not present

## 2018-05-23 DIAGNOSIS — R262 Difficulty in walking, not elsewhere classified: Secondary | ICD-10-CM | POA: Diagnosis not present

## 2018-05-23 DIAGNOSIS — M6281 Muscle weakness (generalized): Secondary | ICD-10-CM | POA: Diagnosis not present

## 2018-05-23 DIAGNOSIS — M25562 Pain in left knee: Secondary | ICD-10-CM | POA: Diagnosis not present

## 2018-05-23 DIAGNOSIS — M25662 Stiffness of left knee, not elsewhere classified: Secondary | ICD-10-CM | POA: Diagnosis not present

## 2018-05-30 DIAGNOSIS — M6281 Muscle weakness (generalized): Secondary | ICD-10-CM | POA: Diagnosis not present

## 2018-05-30 DIAGNOSIS — M25662 Stiffness of left knee, not elsewhere classified: Secondary | ICD-10-CM | POA: Diagnosis not present

## 2018-05-30 DIAGNOSIS — M25562 Pain in left knee: Secondary | ICD-10-CM | POA: Diagnosis not present

## 2018-05-30 DIAGNOSIS — R262 Difficulty in walking, not elsewhere classified: Secondary | ICD-10-CM | POA: Diagnosis not present

## 2018-06-03 DIAGNOSIS — M25562 Pain in left knee: Secondary | ICD-10-CM | POA: Diagnosis not present

## 2018-06-04 ENCOUNTER — Ambulatory Visit: Payer: BLUE CROSS/BLUE SHIELD | Admitting: Family Medicine

## 2018-06-04 ENCOUNTER — Encounter: Payer: Self-pay | Admitting: Family Medicine

## 2018-06-04 VITALS — BP 128/82 | Temp 98.2°F | Ht 73.0 in | Wt 214.8 lb

## 2018-06-04 DIAGNOSIS — K529 Noninfective gastroenteritis and colitis, unspecified: Secondary | ICD-10-CM | POA: Diagnosis not present

## 2018-06-04 MED ORDER — ONDANSETRON 4 MG PO TBDP: 4.0000 mg | ORAL_TABLET | Freq: Three times a day (TID) | ORAL | 0 refills | Status: DC | PRN

## 2018-06-04 NOTE — Progress Notes (Signed)
   Subjective:    Patient ID: William [name], male    DOB: [birth year], 52 y.o.   MRN: [id number]  Headache   This is a new problem. Associated symptoms include abdominal pain and nausea.   Pt states he has not been able to keep anything down since Saturday night. Pt states he has had little sips of liquid. Severe diarrhea. Has had diverticulitis Took zofran yesterday. Pt states he was placed on Prednisone for sinus infection, pt had knee surgery a month ago so he tapered the Prednisone and took the last dose Saturday.  Pt has had sig abd ai kicked     pt tried to get appt with [facility] I doc in [place], did not feel right   Due to see    Dr Tonye Royalty later in September  Last wk in th eve had some ab d pain, then developed bloating and sig nausea nd diarrhea and headache   Appetite not good   sherbert did not   Work,, has diarrhea  Significant  spaouse had transient g I symotoms last wk  Took old zofran one dose and it helped           Review of Systems  Gastrointestinal: Positive for abdominal pain and nausea.  Neurological: Positive for headaches.       Objective:   Physical Exam  Alert active no acute distress.  HEENT normal.  Lungs clear.  Heart rate and rhythm.  Abdomen hyperactive bowel sounds.  Borborygmi present diffuse mild discomfort but no point tenderness no rebound no guarding      Assessment & Plan:  Impression acute gastroenteritis.  Likely viral.  Wife had similar but less serious event 1 week prior.  Patient does have history of diverticulitis bilaterally do not think one on this time rationale discussed tympanic care discussed.  Zofran as needed warning signs discussed

## 2018-06-06 DIAGNOSIS — M25662 Stiffness of left knee, not elsewhere classified: Secondary | ICD-10-CM | POA: Diagnosis not present

## 2018-06-06 DIAGNOSIS — M25562 Pain in left knee: Secondary | ICD-10-CM | POA: Diagnosis not present

## 2018-06-06 DIAGNOSIS — M6281 Muscle weakness (generalized): Secondary | ICD-10-CM | POA: Diagnosis not present

## 2018-06-10 DIAGNOSIS — R262 Difficulty in walking, not elsewhere classified: Secondary | ICD-10-CM | POA: Diagnosis not present

## 2018-06-10 DIAGNOSIS — M25662 Stiffness of left knee, not elsewhere classified: Secondary | ICD-10-CM | POA: Diagnosis not present

## 2018-06-10 DIAGNOSIS — M6281 Muscle weakness (generalized): Secondary | ICD-10-CM | POA: Diagnosis not present

## 2018-06-10 DIAGNOSIS — M25562 Pain in left knee: Secondary | ICD-10-CM | POA: Diagnosis not present

## 2018-06-11 ENCOUNTER — Encounter: Payer: Self-pay | Admitting: Internal Medicine

## 2018-06-13 DIAGNOSIS — M6281 Muscle weakness (generalized): Secondary | ICD-10-CM | POA: Diagnosis not present

## 2018-06-13 DIAGNOSIS — M25662 Stiffness of left knee, not elsewhere classified: Secondary | ICD-10-CM | POA: Diagnosis not present

## 2018-06-13 DIAGNOSIS — M25562 Pain in left knee: Secondary | ICD-10-CM | POA: Diagnosis not present

## 2018-06-13 DIAGNOSIS — R262 Difficulty in walking, not elsewhere classified: Secondary | ICD-10-CM | POA: Diagnosis not present

## 2018-06-14 DIAGNOSIS — N401 Enlarged prostate with lower urinary tract symptoms: Secondary | ICD-10-CM | POA: Diagnosis not present

## 2018-06-14 DIAGNOSIS — R3912 Poor urinary stream: Secondary | ICD-10-CM | POA: Diagnosis not present

## 2018-06-18 ENCOUNTER — Telehealth: Payer: Self-pay | Admitting: Family Medicine

## 2018-06-18 DIAGNOSIS — M25562 Pain in left knee: Secondary | ICD-10-CM | POA: Diagnosis not present

## 2018-06-18 DIAGNOSIS — M6281 Muscle weakness (generalized): Secondary | ICD-10-CM | POA: Diagnosis not present

## 2018-06-18 DIAGNOSIS — M25662 Stiffness of left knee, not elsewhere classified: Secondary | ICD-10-CM | POA: Diagnosis not present

## 2018-06-18 DIAGNOSIS — R262 Difficulty in walking, not elsewhere classified: Secondary | ICD-10-CM | POA: Diagnosis not present

## 2018-06-18 NOTE — Telephone Encounter (Signed)
Patient scheduled office visit tomorrow with Dr Richardson Landry

## 2018-06-18 NOTE — Telephone Encounter (Signed)
Autumn plz get a few more symtomsd and document. Likely will be able to see tomrrow, but need more detail re current cdtn

## 2018-06-18 NOTE — Telephone Encounter (Signed)
Left message to return call 

## 2018-06-18 NOTE — Telephone Encounter (Signed)
Patient stated he has return of abdominal pain, nausea, diarrhea, bloating and headache. The diarrhea continues to be foul smelling- not quite as bad as before but last time he got dehydrated

## 2018-06-18 NOTE — Telephone Encounter (Signed)
o v tom morn

## 2018-06-18 NOTE — Telephone Encounter (Signed)
Patient here a couple weeks ago and now all symptoms started again yesterday.  Woke up with nausea and bad diarrhea again.  He has called the GI doctor to see if he could get in sooner with them but appt not until end of October.  He isn't sure what to do at this point.  No available appts today so he didn't know if he should just go to the ER for treatment?

## 2018-06-18 NOTE — Telephone Encounter (Signed)
Patient seen a few weeks ago with abdominal pain, nausea, diarrhea and hx of diverticulitis

## 2018-06-19 ENCOUNTER — Encounter: Payer: Self-pay | Admitting: Family Medicine

## 2018-06-19 ENCOUNTER — Ambulatory Visit: Payer: BLUE CROSS/BLUE SHIELD | Admitting: Family Medicine

## 2018-06-19 VITALS — BP 116/72 | Temp 97.9°F | Ht 73.0 in | Wt 221.4 lb

## 2018-06-19 DIAGNOSIS — R109 Unspecified abdominal pain: Secondary | ICD-10-CM | POA: Diagnosis not present

## 2018-06-19 DIAGNOSIS — G8929 Other chronic pain: Secondary | ICD-10-CM | POA: Diagnosis not present

## 2018-06-19 MED ORDER — SUCRALFATE 1 G PO TABS
ORAL_TABLET | ORAL | 0 refills | Status: DC
Start: 2018-06-19 — End: 2019-01-22

## 2018-06-19 NOTE — Progress Notes (Signed)
   Subjective:    Patient ID: William [name], male    DOB: 1966-07-28, 52 y.o.   MRN: [id number]  HPI  Patient arrives with ongoing abdominal pain and headache.   More challenges with abd pain  Had bad diarrhea over the weekend  Pain better today  abd discomort butnign and undomfortable  Headache started up this weekend  Stayed away from advil with stomach hx   Still working on knee   Take s duexis for   Knee has pboth pe[id and antiinflam rx   Patient states that his stomach has calmed down a bit today but still has a bad headache.  spicy burger over sat before all hit   Mild enlarged prostate via wcam and watching  Review of Systems No headache, no major weight loss or weight gain, no chest pain no back pain abdominal pain no change in bowel habits complete ROS otherwise negative     Objective:   Physical Exam  Alert and oriented, vitals reviewed and stable, NAD ENT-TM's and ext canals WNL bilat via otoscopic exam Soft palate, tonsils and post pharynx WNL via oropharyngeal exam Neck-symmetric, no masses; thyroid nonpalpable and nontender Pulmonary-no tachypnea or accessory muscle use; Clear without wheezes via auscultation Card--no abnrml murmurs, rhythm reg and rate WNL Carotid pulses symmetric, without bruits Abdominal exam, mild epigast tenderness benign no discrete test mild left      Assessment & Plan:  Impression chronic abdominal pain.  Long discussion held.  Medical work-up today not warranted based on current exams.  Ongoing challenge.  At this time full abdominal work-up is not warranted.  Symptom care discussed.  Warning signs discussed.  Follow-up with gastroenterologist as scheduled/Carafate added to help the reflux symptoms get worse  Greater than 50% of this 25 minute face to face visit was spent in counseling and discussion and coordination of care regarding the above diagnosis/diagnosies

## 2018-06-20 DIAGNOSIS — L820 Inflamed seborrheic keratosis: Secondary | ICD-10-CM | POA: Diagnosis not present

## 2018-06-20 DIAGNOSIS — M25662 Stiffness of left knee, not elsewhere classified: Secondary | ICD-10-CM | POA: Diagnosis not present

## 2018-06-20 DIAGNOSIS — R262 Difficulty in walking, not elsewhere classified: Secondary | ICD-10-CM | POA: Diagnosis not present

## 2018-06-20 DIAGNOSIS — M25562 Pain in left knee: Secondary | ICD-10-CM | POA: Diagnosis not present

## 2018-06-20 DIAGNOSIS — L821 Other seborrheic keratosis: Secondary | ICD-10-CM | POA: Diagnosis not present

## 2018-06-20 DIAGNOSIS — L538 Other specified erythematous conditions: Secondary | ICD-10-CM | POA: Diagnosis not present

## 2018-06-20 DIAGNOSIS — L570 Actinic keratosis: Secondary | ICD-10-CM | POA: Diagnosis not present

## 2018-06-20 DIAGNOSIS — C44519 Basal cell carcinoma of skin of other part of trunk: Secondary | ICD-10-CM | POA: Diagnosis not present

## 2018-06-20 DIAGNOSIS — D485 Neoplasm of uncertain behavior of skin: Secondary | ICD-10-CM | POA: Diagnosis not present

## 2018-06-20 DIAGNOSIS — D1801 Hemangioma of skin and subcutaneous tissue: Secondary | ICD-10-CM | POA: Diagnosis not present

## 2018-06-20 DIAGNOSIS — X32XXXA Exposure to sunlight, initial encounter: Secondary | ICD-10-CM | POA: Diagnosis not present

## 2018-06-20 DIAGNOSIS — L298 Other pruritus: Secondary | ICD-10-CM | POA: Diagnosis not present

## 2018-06-20 DIAGNOSIS — M6281 Muscle weakness (generalized): Secondary | ICD-10-CM | POA: Diagnosis not present

## 2018-06-25 DIAGNOSIS — R262 Difficulty in walking, not elsewhere classified: Secondary | ICD-10-CM | POA: Diagnosis not present

## 2018-06-25 DIAGNOSIS — M6281 Muscle weakness (generalized): Secondary | ICD-10-CM | POA: Diagnosis not present

## 2018-06-25 DIAGNOSIS — M25662 Stiffness of left knee, not elsewhere classified: Secondary | ICD-10-CM | POA: Diagnosis not present

## 2018-06-25 DIAGNOSIS — M25562 Pain in left knee: Secondary | ICD-10-CM | POA: Diagnosis not present

## 2018-06-27 DIAGNOSIS — M25662 Stiffness of left knee, not elsewhere classified: Secondary | ICD-10-CM | POA: Diagnosis not present

## 2018-06-27 DIAGNOSIS — M25611 Stiffness of right shoulder, not elsewhere classified: Secondary | ICD-10-CM | POA: Diagnosis not present

## 2018-06-27 DIAGNOSIS — R262 Difficulty in walking, not elsewhere classified: Secondary | ICD-10-CM | POA: Diagnosis not present

## 2018-06-27 DIAGNOSIS — M25562 Pain in left knee: Secondary | ICD-10-CM | POA: Diagnosis not present

## 2018-07-01 DIAGNOSIS — R262 Difficulty in walking, not elsewhere classified: Secondary | ICD-10-CM | POA: Diagnosis not present

## 2018-07-01 DIAGNOSIS — M6281 Muscle weakness (generalized): Secondary | ICD-10-CM | POA: Diagnosis not present

## 2018-07-01 DIAGNOSIS — M25662 Stiffness of left knee, not elsewhere classified: Secondary | ICD-10-CM | POA: Diagnosis not present

## 2018-07-01 DIAGNOSIS — M25562 Pain in left knee: Secondary | ICD-10-CM | POA: Diagnosis not present

## 2018-07-09 DIAGNOSIS — M6281 Muscle weakness (generalized): Secondary | ICD-10-CM | POA: Diagnosis not present

## 2018-07-09 DIAGNOSIS — M25662 Stiffness of left knee, not elsewhere classified: Secondary | ICD-10-CM | POA: Diagnosis not present

## 2018-07-09 DIAGNOSIS — M25562 Pain in left knee: Secondary | ICD-10-CM | POA: Diagnosis not present

## 2018-07-09 DIAGNOSIS — R262 Difficulty in walking, not elsewhere classified: Secondary | ICD-10-CM | POA: Diagnosis not present

## 2018-07-10 ENCOUNTER — Other Ambulatory Visit: Payer: Self-pay | Admitting: Family Medicine

## 2018-07-10 NOTE — Telephone Encounter (Signed)
Ok plus 5 ref 

## 2018-07-15 ENCOUNTER — Telehealth: Payer: Self-pay | Admitting: Family Medicine

## 2018-07-15 ENCOUNTER — Other Ambulatory Visit: Payer: Self-pay | Admitting: Family Medicine

## 2018-07-15 NOTE — Telephone Encounter (Signed)
Called pharm and gave verbal order. Dr approved on 9/25 for 6 months worth. Pt notified.

## 2018-07-15 NOTE — Telephone Encounter (Signed)
Pt is calling to check the status of refill request on  zolpidem (AMBIEN) 10 MG tablet. The pharmacy states they did not receive the refill sent on 07/10/18.

## 2018-07-23 DIAGNOSIS — M25562 Pain in left knee: Secondary | ICD-10-CM | POA: Diagnosis not present

## 2018-07-25 DIAGNOSIS — N401 Enlarged prostate with lower urinary tract symptoms: Secondary | ICD-10-CM | POA: Diagnosis not present

## 2018-07-25 DIAGNOSIS — R351 Nocturia: Secondary | ICD-10-CM | POA: Diagnosis not present

## 2018-07-25 DIAGNOSIS — R3912 Poor urinary stream: Secondary | ICD-10-CM | POA: Diagnosis not present

## 2018-07-26 ENCOUNTER — Other Ambulatory Visit: Payer: Self-pay | Admitting: Family Medicine

## 2018-08-06 ENCOUNTER — Ambulatory Visit (INDEPENDENT_AMBULATORY_CARE_PROVIDER_SITE_OTHER): Payer: BLUE CROSS/BLUE SHIELD | Admitting: Internal Medicine

## 2018-08-06 ENCOUNTER — Encounter: Payer: Self-pay | Admitting: Internal Medicine

## 2018-08-06 VITALS — BP 126/80 | HR 82 | Ht 73.0 in | Wt 218.0 lb

## 2018-08-06 DIAGNOSIS — R1013 Epigastric pain: Secondary | ICD-10-CM

## 2018-08-06 DIAGNOSIS — Z8719 Personal history of other diseases of the digestive system: Secondary | ICD-10-CM | POA: Diagnosis not present

## 2018-08-06 DIAGNOSIS — K219 Gastro-esophageal reflux disease without esophagitis: Secondary | ICD-10-CM

## 2018-08-06 DIAGNOSIS — R1030 Lower abdominal pain, unspecified: Secondary | ICD-10-CM | POA: Diagnosis not present

## 2018-08-06 DIAGNOSIS — Z9889 Other specified postprocedural states: Secondary | ICD-10-CM

## 2018-08-06 DIAGNOSIS — M25562 Pain in left knee: Secondary | ICD-10-CM | POA: Diagnosis not present

## 2018-08-06 DIAGNOSIS — Z800 Family history of malignant neoplasm of digestive organs: Secondary | ICD-10-CM

## 2018-08-06 NOTE — Progress Notes (Signed)
Patient ID: William [name], male   DOB: Feb 16, 1966, 52 y.o.   MRN: [id number] HPI: William [name] is a 52 year old male with a past medical history of diverticulosis/diverticulitis status post sigmoid resection, history of pancreatitis and elevated liver enzymes secondary to sphincter of Oddi dysfunction status post remote sphincterotomy at [facility] with Dr. Emmit Pomfret, GERD, BPH, hypertension, hyperlipidemia who is seen in consultation at the request of Dr. Wolfgang Phoenix to establish care for his GI issues.  He is here alone today.  He reports that on the whole he is doing well.  He does deal with chronic and intermittent left lower and lower abdominal pain.  Recently this has been very mild and intermittent and manageable.  It does not seem to relate to his bowel movements.  He is eating a daily cereal called "happy inside" which she states is high in fiber and contains a pre-and probiotic.  He has been very regular going for a bowel movement once daily.  No blood in his stool or melena.  Separate from this he has had 2 episodes earlier this year around March and May 2019 of upper abdominal pain associated with nausea, vomiting, decreased appetite and loose stool.  Also associated with headache.  None of these similar episodes recently.  GERD symptoms are well controlled on omeprazole 40 mg daily.  No dysphagia or odynophagia.  Family history notable for a maternal grandmother with colon cancer, colon cancer in his father and ulcerative colitis in his sister.  His last colonoscopy was performed in August 2016 by Dr. Gala Romney.  This showed a left colon anastomosis which was normal-appearing and a few remaining colonic diverticula.  Exam was otherwise normal and 10-year recall was recommended.  The patient reports that he had 3 months of lower abdominal discomfort after this procedure.  He works for [employer].  Past Medical History:  Diagnosis Date  . Difficulty sleeping    TAKES  ADVIL PM  . Diverticulitis of intestine with perforation and abscess   . Diverticulosis   . Dysuria   . ED (erectile dysfunction)   . GERD (gastroesophageal reflux disease)   . Hyperlipidemia   . Hypertension   . Osteoarthritis   . Pancreatitis, acute   . PONV (postoperative nausea and vomiting)   . Prostatitis   . Reflux     Past Surgical History:  Procedure Laterality Date  . ABCESS DRAINAGE     ABDOMINAL  . APPENDECTOMY    . CHOLECYSTECTOMY    . COLONOSCOPY N/A 06/09/2015   Procedure: COLONOSCOPY;  Surgeon: Daneil Dolin, MD;  Location: [facility]ITE;  Service: Endoscopy;  Laterality: N/A;  11:30 Am  . HERNIA REPAIR    . KNEE SURGERY     Both  . LAPAROSCOPIC SIGMOID COLECTOMY N/A 11/17/2013   Procedure: LAPAROSCOPIC SIGMOID COLECTOMY rigid proctoscopy;  Surgeon: Edward Jolly, MD;  Location: [facility] ORS;  Service: General;  Laterality: N/A;  . SHOULDER SURGERY Left May 2016  . SPHINCTEROTOMY     for sphincter of Oddi dysfunction  . TONSILLECTOMY      Outpatient Medications Prior to Visit  Medication Sig Dispense Refill  . fluticasone (FLONASE) 50 MCG/ACT nasal spray SPRAY 2 SPRAYS INTO EACH NOSTRIL EVERY DAY 16 g 1  . hyoscyamine (LEVSIN) 0.125 MG tablet Take 0.125 mg by mouth every 4 (four) hours as needed.    . lovastatin (MEVACOR) 40 MG tablet Take 40 mg by mouth at bedtime.    . lovastatin (MEVACOR) 40  MG tablet TAKE 1 TABLET BY MOUTH EVERYDAY AT BEDTIME 90 tablet 1  . Multiple Vitamins-Minerals (ALIVE MENS ENERGY PO) Take 1 tablet by mouth daily.    Marland Kitchen neomycin-polymyxin-hydrocortisone (CORTISPORIN) 3.5-10000-1 OTIC suspension Place 3 drops in the affected ear four times daily 10 mL 0  . omeprazole (PRILOSEC) 40 MG capsule TAKE 1 CAPSULE BY MOUTH EVERY DAY 90 capsule 1  . ondansetron (ZOFRAN ODT) 4 MG disintegrating tablet Take 1 tablet (4 mg total) by mouth every 8 (eight) hours as needed for nausea or vomiting. 20 tablet 0  . silodosin (RAPAFLO) 8 MG CAPS capsule  Take 8 mg by mouth daily with breakfast.    . sucralfate (CARAFATE) 1 g tablet One ac in 1 to 2 oz water (Patient taking differently: as needed. One ac in 1 to 2 oz water) 42 tablet 0  . zolpidem (AMBIEN) 10 MG tablet TAKE 1 TABLET BY MOUTH AT BEDTIME AS NEEDED FOR SLEEP 30 tablet 5  . finasteride (PROSCAR) 5 MG tablet Take 1 tablet (5 mg total) by mouth daily. 90 tablet 3  . lisinopril (PRINIVIL,ZESTRIL) 10 MG tablet One p o qam 90 tablet 1   No facility-administered medications prior to visit.     Allergies  Allergen Reactions  . Acyclovir And Related   . Biaxin [Clarithromycin]     Headache and vomiting,sores in mouth  . Flomax [Tamsulosin Hcl]     Headache, dizziness  . Other Nausea And Vomiting    anesthesia     Family History  Problem Relation Age of Onset  . Cancer Father        ? Colon mass removed   . Ulcerative colitis Sister   . Colon cancer Maternal Grandmother        64s    Social History   Tobacco Use  . Smoking status: Former Smoker    Last attempt to quit: 11/10/1986    Years since quitting: 31.7  . Smokeless tobacco: Never Used  Substance Use Topics  . Alcohol use: Yes    Alcohol/week: 1.0 standard drinks    Types: 1 Glasses of wine per week    Comment: 2-3 glasses a week.   . Drug use: No    ROS: As per history of present illness, otherwise negative  BP 126/80   Pulse 82   Ht 6\' 1"  (1.854 m)   Wt 218 lb (98.9 kg)   BMI 28.76 kg/m  Constitutional: Well-developed and well-nourished. No distress. HEENT: Normocephalic and atraumatic.  Conjunctivae are normal.  No scleral icterus. Neck: Neck supple. Trachea midline. Cardiovascular: Normal rate, regular rhythm and intact distal pulses. No M/R/G Pulmonary/chest: Effort normal and breath sounds normal. No wheezing, rales or rhonchi. Abdominal: Soft, nontender, nondistended. Bowel sounds active throughout. There are no masses palpable. No hepatosplenomegaly.  Well-healed abdominal scars Extremities:  no clubbing, cyanosis, or edema Neurological: Alert and oriented to person place and time. Skin: Skin is warm and dry.  Psychiatric: Normal mood and affect. Behavior is normal.  RELEVANT LABS AND IMAGING: CBC    Component Value Date/Time   WBC 5.6 11/12/2017 1516   RBC 5.36 11/12/2017 1516   HGB 15.8 11/12/2017 1516   HGB 16.3 06/14/2015 1108   HCT 45.0 11/12/2017 1516   HCT 46.1 06/14/2015 1108   PLT 212 11/12/2017 1516   PLT 220 06/14/2015 1108   MCV 84.0 11/12/2017 1516   MCV 85 06/14/2015 1108   MCH 29.5 11/12/2017 1516   MCHC 35.1 11/12/2017 1516  RDW 13.0 11/12/2017 1516   RDW 14.7 06/14/2015 1108   LYMPHSABS 1.9 06/26/2016 1345   LYMPHSABS 1.6 06/14/2015 1108   MONOABS 0.4 06/26/2016 1345   EOSABS 0.1 06/26/2016 1345   EOSABS 0.1 06/14/2015 1108   BASOSABS 0.0 06/26/2016 1345   BASOSABS 0.0 06/14/2015 1108    CMP     Component Value Date/Time   NA 141 03/21/2018 1110   K 4.4 03/21/2018 1110   CL 103 03/21/2018 1110   CO2 22 03/21/2018 1110   GLUCOSE 96 03/21/2018 1110   GLUCOSE 102 (H) 11/12/2017 1516   BUN 26 (H) 03/21/2018 1110   CREATININE 0.71 (L) 03/21/2018 1110   CREATININE 0.94 04/06/2014 0747   CALCIUM 9.7 03/21/2018 1110   PROT 7.1 03/21/2018 1110   ALBUMIN 4.8 03/21/2018 1110   AST 30 03/21/2018 1110   ALT 53 (H) 03/21/2018 1110   ALKPHOS 63 03/21/2018 1110   BILITOT 0.3 03/21/2018 1110   GFRNONAA 106 03/21/2018 1110   GFRAA 123 03/21/2018 1110    ASSESSMENT/PLAN: 52 year old male with a past medical history of diverticulosis/diverticulitis status post sigmoid resection, history of pancreatitis and elevated liver enzymes secondary to sphincter of Oddi dysfunction status post remote sphincterotomy at [facility] with Dr. Emmit Pomfret, GERD, BPH, hypertension, hyperlipidemia who is seen in consultation at the request of Dr. Wolfgang Phoenix to establish care for his GI issues.    1.  History of diverticulosis status post sigmoid resection/intermittent lower  and left lower abdominal pain --there may be a component of irritable bowel as it relates to his abdominal pain.  His colonoscopy after surgery was normal and did not show any evidence of severe diverticulosis or colonic stricture.  He is doing well now with high-fiber diet along with pre-and probiotics.  He also does occasionally use Levsin which works well for him.  No change to this current regimen at this time.  2.  Episodes of nausea, vomiting and upper abdominal pain --this raises the question of biliary type pain.  Given his history of sphincter of Oddi dysfunction and biliary sphincterotomy I feel that should these attacks recur there within 24 to 36 hours he should come for a hepatic function panel, amylase and lipase.  If numbers were found to be elevated he would need repeat biliary evaluation and possible repeat sphincterotomy.  He will notify me if these episodes occur.  3.  GERD --stable without alarm symptoms.  He will continue omeprazole 40 mg daily  4.  CRC screening --family history of colon cancer and thus I recommend repeat screening colonoscopy in August 2021   PT:[id number], Grace Bushy, [address] [place], [place] [address]

## 2018-08-06 NOTE — Patient Instructions (Signed)
You will be due for a recall colonoscopy in August 2021. We will send you a reminder in the mail when it gets closer to that time.  Please follow up with Dr Hilarie Fredrickson in the office as needed.  If you are age 54 or older, your body mass index should be between 23-30. Your Body mass index is 28.76 kg/m. If this is out of the aforementioned range listed, please consider follow up with your Primary Care Provider.  If you are age 87 or younger, your body mass index should be between 19-25. Your Body mass index is 28.76 kg/m. If this is out of the aformentioned range listed, please consider follow up with your Primary Care Provider.

## 2018-08-08 DIAGNOSIS — C44519 Basal cell carcinoma of skin of other part of trunk: Secondary | ICD-10-CM | POA: Diagnosis not present

## 2018-08-08 DIAGNOSIS — L538 Other specified erythematous conditions: Secondary | ICD-10-CM | POA: Diagnosis not present

## 2018-08-08 DIAGNOSIS — D2339 Other benign neoplasm of skin of other parts of face: Secondary | ICD-10-CM | POA: Diagnosis not present

## 2018-08-08 DIAGNOSIS — L820 Inflamed seborrheic keratosis: Secondary | ICD-10-CM | POA: Diagnosis not present

## 2018-08-19 DIAGNOSIS — M1712 Unilateral primary osteoarthritis, left knee: Secondary | ICD-10-CM | POA: Diagnosis not present

## 2018-08-26 DIAGNOSIS — M1712 Unilateral primary osteoarthritis, left knee: Secondary | ICD-10-CM | POA: Diagnosis not present

## 2018-09-02 DIAGNOSIS — M25512 Pain in left shoulder: Secondary | ICD-10-CM | POA: Diagnosis not present

## 2018-09-02 DIAGNOSIS — M1712 Unilateral primary osteoarthritis, left knee: Secondary | ICD-10-CM | POA: Diagnosis not present

## 2018-09-02 DIAGNOSIS — M25511 Pain in right shoulder: Secondary | ICD-10-CM | POA: Diagnosis not present

## 2018-09-04 DIAGNOSIS — L820 Inflamed seborrheic keratosis: Secondary | ICD-10-CM | POA: Diagnosis not present

## 2018-09-04 DIAGNOSIS — L821 Other seborrheic keratosis: Secondary | ICD-10-CM | POA: Diagnosis not present

## 2018-09-04 DIAGNOSIS — L538 Other specified erythematous conditions: Secondary | ICD-10-CM | POA: Diagnosis not present

## 2018-09-04 DIAGNOSIS — L923 Foreign body granuloma of the skin and subcutaneous tissue: Secondary | ICD-10-CM | POA: Diagnosis not present

## 2018-09-04 DIAGNOSIS — D2239 Melanocytic nevi of other parts of face: Secondary | ICD-10-CM | POA: Diagnosis not present

## 2018-09-09 ENCOUNTER — Encounter: Payer: Self-pay | Admitting: Family Medicine

## 2018-09-10 ENCOUNTER — Other Ambulatory Visit: Payer: Self-pay | Admitting: *Deleted

## 2018-09-10 MED ORDER — FLUTICASONE PROPIONATE 50 MCG/ACT NA SUSP: NASAL | 5 refills | Status: DC

## 2018-09-18 DIAGNOSIS — M6752 Plica syndrome, left knee: Secondary | ICD-10-CM | POA: Diagnosis not present

## 2018-09-18 DIAGNOSIS — M170 Bilateral primary osteoarthritis of knee: Secondary | ICD-10-CM | POA: Diagnosis not present

## 2018-09-18 DIAGNOSIS — S83232A Complex tear of medial meniscus, current injury, left knee, initial encounter: Secondary | ICD-10-CM | POA: Diagnosis not present

## 2018-09-18 DIAGNOSIS — S83242A Other tear of medial meniscus, current injury, left knee, initial encounter: Secondary | ICD-10-CM | POA: Diagnosis not present

## 2018-09-18 DIAGNOSIS — M2242 Chondromalacia patellae, left knee: Secondary | ICD-10-CM | POA: Diagnosis not present

## 2018-09-18 DIAGNOSIS — G8918 Other acute postprocedural pain: Secondary | ICD-10-CM | POA: Diagnosis not present

## 2018-09-26 DIAGNOSIS — M6752 Plica syndrome, left knee: Secondary | ICD-10-CM | POA: Diagnosis not present

## 2018-10-07 DIAGNOSIS — M25462 Effusion, left knee: Secondary | ICD-10-CM | POA: Diagnosis not present

## 2018-10-07 DIAGNOSIS — M25662 Stiffness of left knee, not elsewhere classified: Secondary | ICD-10-CM | POA: Diagnosis not present

## 2018-10-07 DIAGNOSIS — M25562 Pain in left knee: Secondary | ICD-10-CM | POA: Diagnosis not present

## 2018-10-14 DIAGNOSIS — M25562 Pain in left knee: Secondary | ICD-10-CM | POA: Diagnosis not present

## 2018-10-14 DIAGNOSIS — M25662 Stiffness of left knee, not elsewhere classified: Secondary | ICD-10-CM | POA: Diagnosis not present

## 2018-10-14 DIAGNOSIS — M25462 Effusion, left knee: Secondary | ICD-10-CM | POA: Diagnosis not present

## 2018-10-19 ENCOUNTER — Other Ambulatory Visit: Payer: Self-pay | Admitting: Family Medicine

## 2018-10-24 ENCOUNTER — Ambulatory Visit: Payer: BLUE CROSS/BLUE SHIELD | Admitting: Family Medicine

## 2018-10-24 ENCOUNTER — Encounter: Payer: Self-pay | Admitting: Family Medicine

## 2018-10-24 VITALS — BP 122/74 | Temp 98.3°F | Wt 228.8 lb

## 2018-10-24 DIAGNOSIS — J329 Chronic sinusitis, unspecified: Secondary | ICD-10-CM | POA: Diagnosis not present

## 2018-10-24 MED ORDER — AMOXICILLIN-POT CLAVULANATE 875-125 MG PO TABS
ORAL_TABLET | ORAL | 0 refills | Status: DC
Start: 2018-10-24 — End: 2019-01-22

## 2018-10-24 MED ORDER — HYOSCYAMINE SULFATE 0.125 MG PO TABS: 0.1250 mg | ORAL_TABLET | ORAL | 3 refills | Status: DC | PRN

## 2018-10-24 NOTE — Progress Notes (Signed)
   Subjective:    Patient ID: William [name], male    DOB: 02-22-1966, 53 y.o.   MRN: [id number]  Sinusitis  This is a new problem. The current episode started in the past 7 days. (Hard to clear throat) Treatments tried: Zytrec D, Benadryl  The treatment provided mild relief.   Pt states his right eye has been crusty, itchy and some drainage. Pt states the pressure is greater on right side but is also present on left side.    Has had some cong and drangae and cough and runny nose  No decong spray    Eye crusty and irrit  Worse on the right side   s p knee surg, overall doing better   s p meniscal tear, had a re tear f the injury, now beter   Staying with the symtoms   Some forntal heafache noo bad coughoing o  Review of Systems No headache, no major weight loss or weight gain, no chest pain no back pain abdominal pain no change in bowel habits complete ROS otherwise negative     Objective:   Physical Exam Alert, mild malaise. Hydration good Vitals stable. frontal/ maxillary tenderness evident positive nasal congestion. pharynx normal neck supple  lungs clear/no crackles or wheezes. heart regular in rhythm        Assessment & Plan:  Impression rhinosinusitis likely post viral, discussed with patient. plan antibiotics prescribed. Questions answered. Symptomatic care discussed. warning signs discussed. WSL

## 2018-10-30 ENCOUNTER — Encounter: Payer: Self-pay | Admitting: Family Medicine

## 2018-10-30 ENCOUNTER — Other Ambulatory Visit: Payer: Self-pay

## 2018-10-30 MED ORDER — CEFPROZIL 500 MG PO TABS: 500.0000 mg | ORAL_TABLET | Freq: Two times a day (BID) | ORAL | 0 refills | Status: AC

## 2018-10-30 NOTE — Telephone Encounter (Signed)
I spoke with Patient and he states he still has some nasal/head congestion and a runny nose, no fever. Please advise if another antibx can be called in to the [facility] Dr. Lorina Rabon if it is still needed.

## 2018-10-31 DIAGNOSIS — R351 Nocturia: Secondary | ICD-10-CM | POA: Diagnosis not present

## 2018-10-31 DIAGNOSIS — N5201 Erectile dysfunction due to arterial insufficiency: Secondary | ICD-10-CM | POA: Diagnosis not present

## 2018-10-31 DIAGNOSIS — N401 Enlarged prostate with lower urinary tract symptoms: Secondary | ICD-10-CM | POA: Diagnosis not present

## 2018-11-13 DIAGNOSIS — Z029 Encounter for administrative examinations, unspecified: Secondary | ICD-10-CM

## 2018-11-25 DIAGNOSIS — M25511 Pain in right shoulder: Secondary | ICD-10-CM | POA: Diagnosis not present

## 2018-11-25 DIAGNOSIS — M25512 Pain in left shoulder: Secondary | ICD-10-CM | POA: Diagnosis not present

## 2018-11-25 DIAGNOSIS — M25462 Effusion, left knee: Secondary | ICD-10-CM | POA: Diagnosis not present

## 2018-12-24 DIAGNOSIS — L820 Inflamed seborrheic keratosis: Secondary | ICD-10-CM | POA: Diagnosis not present

## 2018-12-24 DIAGNOSIS — D1801 Hemangioma of skin and subcutaneous tissue: Secondary | ICD-10-CM | POA: Diagnosis not present

## 2018-12-24 DIAGNOSIS — X32XXXA Exposure to sunlight, initial encounter: Secondary | ICD-10-CM | POA: Diagnosis not present

## 2018-12-24 DIAGNOSIS — L570 Actinic keratosis: Secondary | ICD-10-CM | POA: Diagnosis not present

## 2018-12-24 DIAGNOSIS — L538 Other specified erythematous conditions: Secondary | ICD-10-CM | POA: Diagnosis not present

## 2018-12-24 DIAGNOSIS — D485 Neoplasm of uncertain behavior of skin: Secondary | ICD-10-CM | POA: Diagnosis not present

## 2018-12-27 ENCOUNTER — Other Ambulatory Visit: Payer: Self-pay | Admitting: Family Medicine

## 2018-12-27 DIAGNOSIS — M545 Low back pain: Secondary | ICD-10-CM | POA: Diagnosis not present

## 2018-12-27 NOTE — Telephone Encounter (Signed)
Actually saw sept and jan  Six mo worth ok

## 2019-01-13 DIAGNOSIS — M545 Low back pain: Secondary | ICD-10-CM | POA: Diagnosis not present

## 2019-01-21 ENCOUNTER — Encounter: Payer: Self-pay | Admitting: Family Medicine

## 2019-01-22 ENCOUNTER — Other Ambulatory Visit: Payer: Self-pay

## 2019-01-22 ENCOUNTER — Ambulatory Visit (INDEPENDENT_AMBULATORY_CARE_PROVIDER_SITE_OTHER): Payer: BLUE CROSS/BLUE SHIELD | Admitting: Family Medicine

## 2019-01-22 DIAGNOSIS — I10 Essential (primary) hypertension: Secondary | ICD-10-CM

## 2019-01-22 MED ORDER — AMLODIPINE BESYLATE 5 MG PO TABS: 5.0000 mg | ORAL_TABLET | Freq: Every day | ORAL | 5 refills | Status: DC

## 2019-01-22 NOTE — Progress Notes (Signed)
   Subjective:    Patient ID: William [name], male    DOB: 07-Oct-1966, 53 y.o.   MRN: [id number] Video plus audio virtual visit Hypertension  This is a chronic problem.  pt states his Blood Pressure is back up again. It has been good for some time as he was working out and doing well. he was able to discontinue the Lisinopril once he began taking the Alfusosin which was prescribed for his BPH issues. If he took both the Lisinopril and Alfusosin, his BP was getting low had dizziness. I used to take 10 MG.  At home bp has been 130/90 to 140/105. Virtual Visit via Telephone Note  I connected with William [name] on 01/22/19 at  2:00 PM EDT by telephone and verified that I am speaking with the correct person using two identifiers.   I discussed the limitations, risks, security and privacy concerns of performing an evaluation and management service by telephone and the availability of in person appointments. I also discussed with the patient that there may be a patient responsible charge related to this service. The patient expressed understanding and agreed to proceed.   History of Present Illness:    Observations/Objective:   Assessment and Plan:   Follow Up Instructions:    I discussed the assessment and treatment plan with the patient. The patient was provided an opportunity to ask questions and all were answered. The patient agreed with the plan and demonstrated an understanding of the instructions.   The patient was advised to call back or seek an in-person evaluation if the symptoms worsen or if the condition fails to improve as anticipated.  I provided 58minutes of non-face-to-face time during this encounter.       Review of Systems No headache, no major weight loss or weight gain, no chest pain no back pain abdominal pain no change in bowel habits complete ROS otherwise negative     Objective:   Physical Exam   Virtual exam     Assessment & Plan:  Impression  hypertension suboptimal discussed potential choices discussed.  ACE inhibitor's as well as undue side effects.  Will utilize Norvasc 5 mg proper use discussed

## 2019-01-23 DIAGNOSIS — M5416 Radiculopathy, lumbar region: Secondary | ICD-10-CM | POA: Diagnosis not present

## 2019-01-23 DIAGNOSIS — M545 Low back pain: Secondary | ICD-10-CM | POA: Diagnosis not present

## 2019-01-27 DIAGNOSIS — M545 Low back pain: Secondary | ICD-10-CM | POA: Diagnosis not present

## 2019-01-27 DIAGNOSIS — M5416 Radiculopathy, lumbar region: Secondary | ICD-10-CM | POA: Diagnosis not present

## 2019-02-14 DIAGNOSIS — M545 Low back pain: Secondary | ICD-10-CM | POA: Diagnosis not present

## 2019-02-14 DIAGNOSIS — M5416 Radiculopathy, lumbar region: Secondary | ICD-10-CM | POA: Diagnosis not present

## 2019-02-24 ENCOUNTER — Other Ambulatory Visit: Payer: Self-pay | Admitting: Family Medicine

## 2019-02-24 MED ORDER — NEOMYCIN-POLYMYXIN-HC 3.5-10000-1 OT SUSP: OTIC | 0 refills | Status: DC

## 2019-03-11 DIAGNOSIS — M545 Low back pain: Secondary | ICD-10-CM | POA: Diagnosis not present

## 2019-03-11 DIAGNOSIS — M5416 Radiculopathy, lumbar region: Secondary | ICD-10-CM | POA: Diagnosis not present

## 2019-03-17 DIAGNOSIS — N5201 Erectile dysfunction due to arterial insufficiency: Secondary | ICD-10-CM | POA: Diagnosis not present

## 2019-03-17 DIAGNOSIS — R351 Nocturia: Secondary | ICD-10-CM | POA: Diagnosis not present

## 2019-03-17 DIAGNOSIS — N401 Enlarged prostate with lower urinary tract symptoms: Secondary | ICD-10-CM | POA: Diagnosis not present

## 2019-03-21 ENCOUNTER — Other Ambulatory Visit: Payer: Self-pay | Admitting: Family Medicine

## 2019-03-31 ENCOUNTER — Encounter: Payer: Self-pay | Admitting: Family Medicine

## 2019-04-02 ENCOUNTER — Encounter: Payer: Self-pay | Admitting: Family Medicine

## 2019-04-04 ENCOUNTER — Ambulatory Visit (INDEPENDENT_AMBULATORY_CARE_PROVIDER_SITE_OTHER): Payer: BC Managed Care – PPO | Admitting: Family Medicine

## 2019-04-04 ENCOUNTER — Other Ambulatory Visit: Payer: Self-pay

## 2019-04-04 DIAGNOSIS — K594 Anal spasm: Secondary | ICD-10-CM

## 2019-04-04 MED ORDER — NITROGLYCERIN 0.4 % RE OINT: TOPICAL_OINTMENT | RECTAL | 3 refills | Status: DC

## 2019-04-04 NOTE — Progress Notes (Signed)
   Subjective:    Patient ID: William [name], male    DOB: 10-Aug-1966, 53 y.o.   MRN: [id number] Audio only HPI  Patient would like to discuss proctalgia fugax. Patient would like referral to specialist.  Virtual Visit via Video Note  I connected with [name] on 04/04/19 at  8:30 AM EDT by a video enabled telemedicine application and verified that I am speaking with the correct person using two identifiers.  Location: Patient: home Provider: office   I discussed the limitations of evaluation and management by telemedicine and the availability of in person appointments. The patient expressed understanding and agreed to proceed.  History of Present Illness:    Observations/Objective:   Assessment and Plan:   Follow Up Instructions:    I discussed the assessment and treatment plan with the patient. The patient was provided an opportunity to ask questions and all were answered. The patient agreed with the plan and demonstrated an understanding of the instructions.   The patient was advised to call back or seek an in-person evaluation if the symptoms worsen or if the condition fails to improve as anticipated.  I provided 42minutes of non-face-to-face time during this encounter.  Went away for months   Having progressive severe pain  Ice helps   Sudden onset, severe pain  Much more severe  Followed by urologist    Review of Systems No headache, no major weight loss or weight gain, no chest pain no back pain abdominal pain no change in bowel habits complete ROS otherwise negative     Objective:   Physical Exam   Virtual     Assessment & Plan:  Impression proctalgia fugax discussed at length.  Options discussed.  Will try nitroglycerin ointment twice daily to affected area rationale discussed

## 2019-04-20 ENCOUNTER — Other Ambulatory Visit: Payer: Self-pay | Admitting: Family Medicine

## 2019-04-22 ENCOUNTER — Encounter: Payer: Self-pay | Admitting: Family Medicine

## 2019-04-22 DIAGNOSIS — M19011 Primary osteoarthritis, right shoulder: Secondary | ICD-10-CM | POA: Diagnosis not present

## 2019-04-22 DIAGNOSIS — M19012 Primary osteoarthritis, left shoulder: Secondary | ICD-10-CM | POA: Diagnosis not present

## 2019-05-26 DIAGNOSIS — M5416 Radiculopathy, lumbar region: Secondary | ICD-10-CM | POA: Diagnosis not present

## 2019-05-26 DIAGNOSIS — M545 Low back pain: Secondary | ICD-10-CM | POA: Diagnosis not present

## 2019-05-30 DIAGNOSIS — M5416 Radiculopathy, lumbar region: Secondary | ICD-10-CM | POA: Diagnosis not present

## 2019-05-30 DIAGNOSIS — M545 Low back pain: Secondary | ICD-10-CM | POA: Diagnosis not present

## 2019-06-09 DIAGNOSIS — M5416 Radiculopathy, lumbar region: Secondary | ICD-10-CM | POA: Diagnosis not present

## 2019-06-09 DIAGNOSIS — M545 Low back pain: Secondary | ICD-10-CM | POA: Diagnosis not present

## 2019-06-17 DIAGNOSIS — N5201 Erectile dysfunction due to arterial insufficiency: Secondary | ICD-10-CM | POA: Diagnosis not present

## 2019-06-17 DIAGNOSIS — R351 Nocturia: Secondary | ICD-10-CM | POA: Diagnosis not present

## 2019-06-17 DIAGNOSIS — N401 Enlarged prostate with lower urinary tract symptoms: Secondary | ICD-10-CM | POA: Diagnosis not present

## 2019-07-03 DIAGNOSIS — Z08 Encounter for follow-up examination after completed treatment for malignant neoplasm: Secondary | ICD-10-CM | POA: Diagnosis not present

## 2019-07-03 DIAGNOSIS — X32XXXA Exposure to sunlight, initial encounter: Secondary | ICD-10-CM | POA: Diagnosis not present

## 2019-07-03 DIAGNOSIS — Z85828 Personal history of other malignant neoplasm of skin: Secondary | ICD-10-CM | POA: Diagnosis not present

## 2019-07-03 DIAGNOSIS — L820 Inflamed seborrheic keratosis: Secondary | ICD-10-CM | POA: Diagnosis not present

## 2019-07-03 DIAGNOSIS — D225 Melanocytic nevi of trunk: Secondary | ICD-10-CM | POA: Diagnosis not present

## 2019-07-03 DIAGNOSIS — L720 Epidermal cyst: Secondary | ICD-10-CM | POA: Diagnosis not present

## 2019-07-03 DIAGNOSIS — L570 Actinic keratosis: Secondary | ICD-10-CM | POA: Diagnosis not present

## 2019-07-03 DIAGNOSIS — L538 Other specified erythematous conditions: Secondary | ICD-10-CM | POA: Diagnosis not present

## 2019-07-04 DIAGNOSIS — Z23 Encounter for immunization: Secondary | ICD-10-CM | POA: Diagnosis not present

## 2019-07-05 ENCOUNTER — Other Ambulatory Visit: Payer: Self-pay | Admitting: Family Medicine

## 2019-07-07 NOTE — Telephone Encounter (Signed)
Seen late April for chornic ok times ne for all

## 2019-07-08 DIAGNOSIS — M545 Low back pain: Secondary | ICD-10-CM | POA: Diagnosis not present

## 2019-07-09 ENCOUNTER — Encounter: Payer: Self-pay | Admitting: Family Medicine

## 2019-07-09 MED ORDER — AMLODIPINE BESYLATE 10 MG PO TABS: ORAL_TABLET | ORAL | 1 refills | Status: DC

## 2019-07-11 DIAGNOSIS — M545 Low back pain: Secondary | ICD-10-CM | POA: Diagnosis not present

## 2019-07-11 DIAGNOSIS — M47816 Spondylosis without myelopathy or radiculopathy, lumbar region: Secondary | ICD-10-CM | POA: Diagnosis not present

## 2019-07-21 ENCOUNTER — Encounter: Payer: Self-pay | Admitting: Family Medicine

## 2019-07-21 MED ORDER — ZOLPIDEM TARTRATE 10 MG PO TABS: 10.0000 mg | ORAL_TABLET | Freq: Every day | ORAL | 5 refills | Status: DC

## 2019-08-12 DIAGNOSIS — M47816 Spondylosis without myelopathy or radiculopathy, lumbar region: Secondary | ICD-10-CM | POA: Diagnosis not present

## 2019-08-12 DIAGNOSIS — M545 Low back pain: Secondary | ICD-10-CM | POA: Diagnosis not present

## 2019-08-28 DIAGNOSIS — M25511 Pain in right shoulder: Secondary | ICD-10-CM | POA: Diagnosis not present

## 2019-08-28 DIAGNOSIS — M25512 Pain in left shoulder: Secondary | ICD-10-CM | POA: Diagnosis not present

## 2019-09-18 ENCOUNTER — Other Ambulatory Visit: Payer: Self-pay | Admitting: Family Medicine

## 2019-09-21 NOTE — Telephone Encounter (Signed)
Call pt sched chronic ov then may ref imes one

## 2019-09-22 ENCOUNTER — Other Ambulatory Visit: Payer: Self-pay | Admitting: Family Medicine

## 2019-09-22 NOTE — Telephone Encounter (Signed)
Patient scheduled for 10/01/19 for virtual med check

## 2019-09-22 NOTE — Telephone Encounter (Signed)
lvm to schedule virtual visit

## 2019-09-22 NOTE — Telephone Encounter (Signed)
Please scheduled and then route back to nurses

## 2019-10-01 ENCOUNTER — Ambulatory Visit: Payer: BC Managed Care – PPO | Admitting: Family Medicine

## 2019-10-07 ENCOUNTER — Other Ambulatory Visit: Payer: Self-pay

## 2019-10-07 ENCOUNTER — Ambulatory Visit (INDEPENDENT_AMBULATORY_CARE_PROVIDER_SITE_OTHER): Payer: BC Managed Care – PPO | Admitting: Family Medicine

## 2019-10-07 DIAGNOSIS — J329 Chronic sinusitis, unspecified: Secondary | ICD-10-CM | POA: Diagnosis not present

## 2019-10-07 DIAGNOSIS — J310 Chronic rhinitis: Secondary | ICD-10-CM | POA: Diagnosis not present

## 2019-10-07 DIAGNOSIS — I10 Essential (primary) hypertension: Secondary | ICD-10-CM | POA: Diagnosis not present

## 2019-10-07 MED ORDER — FLUTICASONE PROPIONATE 50 MCG/ACT NA SUSP: NASAL | 5 refills | Status: DC

## 2019-10-07 MED ORDER — OMEPRAZOLE 40 MG PO CPDR: 40.0000 mg | DELAYED_RELEASE_CAPSULE | Freq: Every day | ORAL | 1 refills | Status: DC

## 2019-10-07 MED ORDER — CEFPROZIL 500 MG PO TABS: 500.0000 mg | ORAL_TABLET | Freq: Two times a day (BID) | ORAL | 0 refills | Status: DC

## 2019-10-07 MED ORDER — AMLODIPINE BESYLATE 10 MG PO TABS: ORAL_TABLET | ORAL | 1 refills | Status: DC

## 2019-10-07 MED ORDER — LOVASTATIN 40 MG PO TABS: 40.0000 mg | ORAL_TABLET | Freq: Every day | ORAL | 1 refills | Status: DC

## 2019-10-07 NOTE — Progress Notes (Signed)
   Subjective:  Audio only  Patient ID: William [name], male    DOB: 03-23-1966, 53 y.o.   MRN: [id number]  Hypertension This is a chronic problem. Compliance problems: eats more healthy than the average person, walks dog every day, wears a fit bit tracker.   pt states bp is running 110/80 or in that range.  Sinus issues. Stays on zyrtec d. But has had worse symptoms for the past 3 -4 days, dry cough and having to clear throat constantly.  Virtual Visit via Telephone Note  I connected with William [name] on 10/07/19 at  8:30 AM EST by telephone and verified that I am speaking with the correct person using two identifiers.  Location: Patient: home Provider: office   I discussed the limitations, risks, security and privacy concerns of performing an evaluation and management service by telephone and the availability of in person appointments. I also discussed with the patient that there may be a patient responsible charge related to this service. The patient expressed understanding and agreed to proceed.   History of Present Illness:    Observations/Objective:   Assessment and Plan:   Follow Up Instructions:    I discussed the assessment and treatment plan with the patient. The patient was provided an opportunity to ask questions and all were answered. The patient agreed with the plan and demonstrated an understanding of the instructions.   The patient was advised to call back or seek an in-person evaluation if the symptoms worsen or if the condition fails to improve as anticipated.  I provided 20 minutes of non-face-to-face time during this encounter.  Blood pressure medicine and blood pressure levels reviewed today with patient. Compliant with blood pressure medicine. States does not miss a dose. No obvious side effects. Blood pressure generally good when checked elsewhere. Watching salt intake.   Patient feels his congestion and cough is just a mild flareup of his chronic  difficulties.  No exposure to anyone else sick.  No shortness of breath.  No fever no sore throat  No vomiting no diarrhea  Review of Systems See above    Objective:   Physical Exam   Virtual    Assessment & Plan:  Impression 1 hypertension good control discussed maintain same meds medications refilled diet exercise discussed  2.  Respiratory symptoms.  Potential extension of chronic rhinitis.  Will cover with an antibiotic.  Warning signs also discussed in case other symptoms develop

## 2019-10-09 ENCOUNTER — Telehealth: Payer: Self-pay | Admitting: Family Medicine

## 2019-10-09 NOTE — Telephone Encounter (Signed)
Pt's wife tested positive. Pt has been feeling a little achy and has a slight cough. Feeling a little better today and would like to know what he should do. Dr. Richardson Landry done virtual 12/22 and prescribed antibiotic. Should he continue to take that or not?   CB# [phone]

## 2019-10-09 NOTE — Telephone Encounter (Signed)
Please talk with patient If the patient would like to do a virtual visit we can certainly do so If the patient is doing okay since he has started the antibiotic I would still take it for 7 days But I would also recommend that he do Covid testing Covid testing is available this morning but he needs to go online and signed out right away If the patient would like to have additional precaution information sent to him via MyChart please let me know and we can send that

## 2019-10-09 NOTE — Telephone Encounter (Signed)
Patient is currently signing up for testing. Patient will quarantine as directed. Warning signs discussed -virtual visit declined. Questions answered.

## 2019-10-10 ENCOUNTER — Encounter: Payer: Self-pay | Admitting: Family Medicine

## 2019-10-12 DIAGNOSIS — R509 Fever, unspecified: Secondary | ICD-10-CM | POA: Diagnosis not present

## 2019-10-12 DIAGNOSIS — R05 Cough: Secondary | ICD-10-CM | POA: Diagnosis not present

## 2019-10-12 DIAGNOSIS — Z20828 Contact with and (suspected) exposure to other viral communicable diseases: Secondary | ICD-10-CM | POA: Diagnosis not present

## 2019-10-12 DIAGNOSIS — R5383 Other fatigue: Secondary | ICD-10-CM | POA: Diagnosis not present

## 2019-10-13 ENCOUNTER — Ambulatory Visit (INDEPENDENT_AMBULATORY_CARE_PROVIDER_SITE_OTHER): Payer: BC Managed Care – PPO | Admitting: Family Medicine

## 2019-10-13 DIAGNOSIS — U071 COVID-19: Secondary | ICD-10-CM

## 2019-10-13 MED ORDER — ALPRAZOLAM 0.5 MG PO TABS: ORAL_TABLET | ORAL | 0 refills | Status: DC

## 2019-10-13 MED ORDER — ONDANSETRON 4 MG PO TBDP: ORAL_TABLET | ORAL | 0 refills | Status: DC

## 2019-10-13 NOTE — Progress Notes (Signed)
   Subjective:  Audio plus video  Patient ID: William [name], male    DOB: [birth year], 53 y.o.   MRN: [id number]  HPI Pt tested positive for COVID yesterday at [facility]. Pt is having a dry cough, nausea, dizzy, light headed, congestion, sore throat, body aches, loss of smell/taste(comes and goes), no appetite, focus is not as good. Pt also having tingling/warm sensation in chest, hand, feet and groin area that comes and goes. Pt states that all family member in the household has tested positive also.   Virtual Visit via Telephone Note  I connected with [name] on 10/13/19 at  3:00 PM EST by telephone and verified that I am speaking with the correct person using two identifiers.  Location: Patient: home Provider: office   I discussed the limitations, risks, security and privacy concerns of performing an evaluation and management service by telephone and the availability of in person appointments. I also discussed with the patient that there may be a patient responsible charge related to this service. The patient expressed understanding and agreed to proceed.   History of Present Illness:    Observations/Objective:   Assessment and Plan:   Follow Up Instructions:    I discussed the assessment and treatment plan with the patient. The patient was provided an opportunity to ask questions and all were answered. The patient agreed with the plan and demonstrated an understanding of the instructions.   The patient was advised to call back or seek an in-person evaluation if the symptoms worsen or if the condition fails to improve as anticipated.  I provide 20 minutes of non-face-to-face time during this encounter.   Vicente Males, LPN  Patient notes substantial anxiety at this time.  He has his own pulse oximeter.  Numbers ranging 92-95.  Not particular shortness of breath.  Not good appetite general weakness noted  Review of Systems See above    Objective:   Physical  Exam  Virtual      Assessment & Plan:  COVID-19 infection.  Discussed.  Educated.  General concerns discussed.  A presently and as needed for anxiety.  Hydration encouraged.  Warning signs discussed carefully

## 2019-10-20 ENCOUNTER — Ambulatory Visit (INDEPENDENT_AMBULATORY_CARE_PROVIDER_SITE_OTHER): Payer: BC Managed Care – PPO | Admitting: Family Medicine

## 2019-10-20 ENCOUNTER — Other Ambulatory Visit: Payer: Self-pay

## 2019-10-20 ENCOUNTER — Encounter: Payer: Self-pay | Admitting: Family Medicine

## 2019-10-20 DIAGNOSIS — U071 COVID-19: Secondary | ICD-10-CM

## 2019-10-20 NOTE — Progress Notes (Signed)
   Subjective:  Audio only  Patient ID: William [name], male    DOB: 11/22/1965, 54 y.o.   MRN: [id number]  HPIcongestion is better. Still no taste or smell. He is eating. No nausea. Last fever 99.4) yesterday morning. Cough here and there. Not really bothering him. Does have some green congestion. Still gets waves of light headedness. Still weak. Slight blurry vision for 3 -4 days. Chest pains on both sides from time to time. No sob.   Virtual Visit via Telephone Note  I connected with [name] on 10/20/19 at 11:00 AM EST by telephone and verified that I am speaking with the correct person using two identifiers.  Location: Patient: home  Provider: office   I discussed the limitations, risks, security and privacy concerns of performing an evaluation and management service by telephone and the availability of in person appointments. I also discussed with the patient that there may be a patient responsible charge related to this service. The patient expressed understanding and agreed to proceed. Taking cefzil.    History of Present Illness:    Observations/Objective:   Assessment and Plan:   Follow Up Instructions:    I discussed the assessment and treatment plan with the patient. The patient was provided an opportunity to ask questions and all were answered. The patient agreed with the plan and demonstrated an understanding of the instructions.   The patient was advised to call back or seek an in-person evaluation if the symptoms worsen or if the condition fails to improve as anticipated.  I provided 17 minutes of non-face-to-face time during this encounter.  Patient concerned about ongoing symptoms.  Though overall somewhat better.  Still loss of taste.  Still diminished energy.  Occasional cough.  Fevers up until last evening.  Appetite diminished.  O2 sats have improved overall.  Energy level this morning was also improved     Review of Systems No vomiting no diarrhea  no rash    Objective:   Physical Exam  Virtual      Assessment & Plan:  Impression known COVID-19 infection.  Improving.  Warning signs discussed carefully.  Symptoms management discussed.  Questions answered

## 2019-10-22 DIAGNOSIS — Z20828 Contact with and (suspected) exposure to other viral communicable diseases: Secondary | ICD-10-CM | POA: Diagnosis not present

## 2019-10-26 DIAGNOSIS — Z1159 Encounter for screening for other viral diseases: Secondary | ICD-10-CM | POA: Diagnosis not present

## 2019-10-26 DIAGNOSIS — Z20828 Contact with and (suspected) exposure to other viral communicable diseases: Secondary | ICD-10-CM | POA: Diagnosis not present

## 2019-10-27 ENCOUNTER — Encounter: Payer: Self-pay | Admitting: Family Medicine

## 2019-10-27 NOTE — Telephone Encounter (Signed)
Talked with pt and he states he has been having the chest pain. No sob. No available appt today. Consult with dr Richardson Landry. Virtual appt tomorrow and go to urgent care or ED if worse. Pt verbaliZed understanding. And he made appt for tomorrow

## 2019-10-28 ENCOUNTER — Ambulatory Visit (INDEPENDENT_AMBULATORY_CARE_PROVIDER_SITE_OTHER): Payer: BC Managed Care – PPO | Admitting: Family Medicine

## 2019-10-28 ENCOUNTER — Other Ambulatory Visit: Payer: Self-pay

## 2019-10-28 DIAGNOSIS — U071 COVID-19: Secondary | ICD-10-CM | POA: Diagnosis not present

## 2019-10-28 DIAGNOSIS — I10 Essential (primary) hypertension: Secondary | ICD-10-CM | POA: Diagnosis not present

## 2019-10-28 MED ORDER — LISINOPRIL 5 MG PO TABS: 5.0000 mg | ORAL_TABLET | Freq: Every day | ORAL | 1 refills | Status: DC

## 2019-10-28 NOTE — Progress Notes (Signed)
   Subjective:  Audio only  Patient ID: William [name], male    DOB: [birth year], 54 y.o.   MRN: [id number]  HPI  Patient calls for a follow up on Covid. Patient states he is feeling better and finally got a negative test yesterday but still having some congestion and intermittent chest pains.  Virtual Visit via Video Note  I connected with William [name] on 10/28/19 at  9:30 AM EST by a video enabled telemedicine application and verified that I am speaking with the correct person using two identifiers.  Location: Patient: home Provider: office   I discussed the limitations of evaluation and management by telemedicine and the availability of in person appointments. The patient expressed understanding and agreed to proceed.  History of Present Illness:    Observations/Objective:   Assessment and Plan:   Follow Up Instructions:    I discussed the assessment and treatment plan with the patient. The patient was provided an opportunity to ask questions and all were answered. The patient agreed with the plan and demonstrated an understanding of the instructions.   The patient was advised to call back or seek an in-person evaluation if the symptoms worsen or if the condition fails to improve as anticipated.  I provided 20 minutes of non-face-to-face time during this encounter.  transiet sharp and sudden pain, non exertional  Slight sob with exertion   Walking around, with activity no pain.  Anxiety still an element though overall improving  Fortunately has had no fever  Blood pressure continues to remain suboptimal/discussed       Review of Systems No high fevers no exertional chest pain no severe headaches decent appetite    Objective:   Physical Exam  Virtual      Assessment & Plan:  Impression suboptimal hypertension.  See medications for dose going forward.  Warning signs discussed  2.  COVID-19 clinically improving doubt serious sequelae at this time  discussed

## 2019-11-05 ENCOUNTER — Other Ambulatory Visit (HOSPITAL_COMMUNITY)
Admission: RE | Admit: 2019-11-05 | Discharge: 2019-11-05 | Disposition: A | Discharge status: Home / Self Care | Payer: BC Managed Care – PPO | Source: Ambulatory Visit | Attending: Family Medicine | Admitting: Family Medicine

## 2019-11-05 ENCOUNTER — Ambulatory Visit (HOSPITAL_COMMUNITY)
Admission: RE | Admit: 2019-11-05 | Discharge: 2019-11-05 | Disposition: A | Discharge status: Home / Self Care | Payer: BC Managed Care – PPO | Source: Ambulatory Visit | Attending: Family Medicine | Admitting: Family Medicine

## 2019-11-05 ENCOUNTER — Other Ambulatory Visit: Payer: Self-pay

## 2019-11-05 ENCOUNTER — Other Ambulatory Visit: Payer: Self-pay | Admitting: *Deleted

## 2019-11-05 ENCOUNTER — Encounter (HOSPITAL_COMMUNITY): Payer: Self-pay

## 2019-11-05 DIAGNOSIS — R0602 Shortness of breath: Secondary | ICD-10-CM | POA: Diagnosis not present

## 2019-11-05 DIAGNOSIS — R079 Chest pain, unspecified: Secondary | ICD-10-CM

## 2019-11-05 LAB — C-REACTIVE PROTEIN: CRP: 1.3 mg/dL — ABNORMAL HIGH (ref <1.0)

## 2019-11-05 LAB — BRAIN NATRIURETIC PEPTIDE: B Natriuretic Peptide: 20 pg/mL (ref 0.0–100.0)

## 2019-11-05 LAB — D-DIMER, QUANTITATIVE: D-Dimer, Quant: 0.34 ug/mL-FEU (ref 0.00–0.50)

## 2019-11-06 ENCOUNTER — Encounter: Payer: Self-pay | Admitting: Family Medicine

## 2019-11-06 ENCOUNTER — Ambulatory Visit: Payer: BC Managed Care – PPO | Admitting: Family Medicine

## 2019-11-06 VITALS — BP 130/88 | Temp 96.2°F | Wt 230.2 lb

## 2019-11-06 DIAGNOSIS — R0602 Shortness of breath: Secondary | ICD-10-CM | POA: Diagnosis not present

## 2019-11-06 DIAGNOSIS — R079 Chest pain, unspecified: Secondary | ICD-10-CM

## 2019-11-06 DIAGNOSIS — U071 COVID-19: Secondary | ICD-10-CM | POA: Diagnosis not present

## 2019-11-06 NOTE — Progress Notes (Signed)
   Subjective:    Patient ID: William [name], male    DOB: February 12, 1966, 54 y.o.   MRN: [id number]  HPI Pt here today per provider for follow up on COVID infection. Pt had lab work and xray done yesterday.   Recent Results (from the past 2160 hour(s))  C-reactive protein     Status: Abnormal   Collection Time: 11/05/19  3:47 PM  Result Value Ref Range   CRP 1.3 (H) <1.0 mg/dL    Comment: Performed at [facility], [address] [place], [place] [address]  D-dimer, quantitative (not at [facility])     Status: None   Collection Time: 11/05/19  3:47 PM  Result Value Ref Range   D-Dimer, Quant 0.34 0.00 - 0.50 ug/mL-FEU    Comment: (NOTE) At the manufacturer cut-off of 0.50 ug/mL FEU, this assay has been documented to exclude PE with a sensitivity and negative predictive value of 97 to 99%.  At this time, this assay has not been approved by the FDA to exclude DVT/VTE. Results should be correlated with clinical presentation. Performed at [facility], [address] [place], [place] [address]   Brain natriuretic peptide     Status: None   Collection Time: 11/05/19  3:47 PM  Result Value Ref Range   B Natriuretic Peptide 20.0 0.0 - 100.0 pg/mL    Comment: Performed at [facility], [address] [place], [place] [address]   Last few days notrs ongoing sig chest pains   Ant left chest   Not as sharp but dull ache   [facility]  [facility]   Had trouble laying on the side, bilat often, but more on the legft yest    otherwise fling better   Major rash and eyes have calmed down    Sob hen walking and talking at same time notes sob   Up chairs notes sob with xertion     Review of Systems No headache, no major weight loss or weight gain, no chest pain no back pain abdominal pain no change in bowel habits complete ROS otherwise negative     Objective:   Physical Exam  Alert and oriented, vitals reviewed and stable, NAD ENT-TM's and ext canals WNL  bilat via otoscopic exam Soft palate, tonsils and post pharynx WNL via oropharyngeal exam Neck-symmetric, no masses; thyroid nonpalpable and nontender Pulmonary-no tachypnea or accessory muscle use; Clear without wheezes via auscultation Card--no abnrml murmurs, rhythm reg and rate WNL Carotid pulses symmetric, without bruits       Assessment & Plan:  Impression persistent symptoms status post COVID-19.  Patient had a fairly rough initial course including a number of days of oxygen saturations near 90%.  Gradually continues to improve.  Has had some nonspecific chest discomfort that he wanted to make sure was not a sign make things serious.  EKG perfect.  Chest x-ray perfect.  Blood work reveals normal D-dimer normal BNP CRP very slightly elevated.  I think this all reveals gradual ongoing resolving inflammation of the chest.  Ibuprofen as needed.  Activities discussed.  Questions answered  Greater than 50% of this 30 minute face to face visit was spent in counseling and discussion and coordination of care regarding the above diagnosis/diagnosies

## 2019-11-17 ENCOUNTER — Encounter: Payer: Self-pay | Admitting: Family Medicine

## 2019-11-17 NOTE — Telephone Encounter (Signed)
Please give pt an appt to have hernia checked.

## 2019-11-18 ENCOUNTER — Ambulatory Visit: Payer: BC Managed Care – PPO | Admitting: Family Medicine

## 2019-11-18 ENCOUNTER — Other Ambulatory Visit: Payer: Self-pay

## 2019-11-18 VITALS — BP 128/82 | Temp 98.2°F | Wt 231.0 lb

## 2019-11-18 DIAGNOSIS — Z125 Encounter for screening for malignant neoplasm of prostate: Secondary | ICD-10-CM

## 2019-11-18 DIAGNOSIS — Z79899 Other long term (current) drug therapy: Secondary | ICD-10-CM | POA: Diagnosis not present

## 2019-11-18 DIAGNOSIS — S76212A Strain of adductor muscle, fascia and tendon of left thigh, initial encounter: Secondary | ICD-10-CM

## 2019-11-18 DIAGNOSIS — M79652 Pain in left thigh: Secondary | ICD-10-CM | POA: Diagnosis not present

## 2019-11-18 DIAGNOSIS — Z13220 Encounter for screening for lipoid disorders: Secondary | ICD-10-CM | POA: Diagnosis not present

## 2019-11-18 NOTE — Progress Notes (Signed)
   Subjective:    Patient ID: William [name], male    DOB: 21-Jan-1966, 54 y.o.   MRN: [id number]  HPI  Patient arrives and would like to be checked for possible inguinal hernia on left side. Patient states he noticed it when he was coughing when sick recently.  Pt feels sensitivity in the left lower groin   States firt noticed after a colonoscopy  Notes pain and discomfort No obvious swelling   notrd  pinching sensaion when voughing   Review of Systems No headache, no major weight loss or weight gain, no chest pain no back pain abdominal pain no change in bowel habits complete ROS otherwise negative     Objective:   Physical Exam Alert vitals stable, NAD. Blood pressure good on repeat. HEENT normal. Lungs clear. Heart regular rate and rhythm. Left groin no palpable hernia testicle normal some sensitivity region of concern       Assessment & Plan:  Impression chronic left inguinal strain.  No evidence of hernia.  No need for surgery referral at this time.  Warning signs discussed.  Screening blood work

## 2019-11-19 ENCOUNTER — Encounter: Payer: Self-pay | Admitting: Family Medicine

## 2019-11-20 ENCOUNTER — Encounter: Payer: Self-pay | Admitting: Family Medicine

## 2019-11-28 DIAGNOSIS — Z13220 Encounter for screening for lipoid disorders: Secondary | ICD-10-CM | POA: Diagnosis not present

## 2019-11-28 DIAGNOSIS — Z125 Encounter for screening for malignant neoplasm of prostate: Secondary | ICD-10-CM | POA: Diagnosis not present

## 2019-11-28 DIAGNOSIS — Z79899 Other long term (current) drug therapy: Secondary | ICD-10-CM | POA: Diagnosis not present

## 2019-11-29 LAB — LIPID PANEL
Chol/HDL Ratio: 4 ratio (ref 0.0–5.0)
Cholesterol, Total: 202 mg/dL — ABNORMAL HIGH (ref 100–199)
HDL: 51 mg/dL (ref >39)
LDL Chol Calc (NIH): 126 mg/dL — ABNORMAL HIGH (ref 0–99)
Triglycerides: 143 mg/dL (ref 0–149)
VLDL Cholesterol Cal: 25 mg/dL (ref 5–40)

## 2019-11-29 LAB — BASIC METABOLIC PANEL
BUN/Creatinine Ratio: 28 — ABNORMAL HIGH (ref 9–20)
BUN: 23 mg/dL (ref 6–24)
CO2: 22 mmol/L (ref 20–29)
Calcium: 10 mg/dL (ref 8.7–10.2)
Chloride: 101 mmol/L (ref 96–106)
Creatinine, Ser: 0.81 mg/dL (ref 0.76–1.27)
GFR calc Af Amer: 116 mL/min/{1.73_m2} (ref >59)
GFR calc non Af Amer: 100 mL/min/{1.73_m2} (ref >59)
Glucose: 101 mg/dL — ABNORMAL HIGH (ref 65–99)
Potassium: 4.3 mmol/L (ref 3.5–5.2)
Sodium: 140 mmol/L (ref 134–144)

## 2019-11-29 LAB — HEPATIC FUNCTION PANEL
ALT: 50 IU/L — ABNORMAL HIGH (ref 0–44)
AST: 39 IU/L (ref 0–40)
Albumin: 4.9 g/dL (ref 3.8–4.9)
Alkaline Phosphatase: 70 IU/L (ref 39–117)
Bilirubin Total: 0.5 mg/dL (ref 0.0–1.2)
Bilirubin, Direct: 0.12 mg/dL (ref 0.00–0.40)
Total Protein: 7.2 g/dL (ref 6.0–8.5)

## 2019-11-29 LAB — PSA: Prostate Specific Ag, Serum: 2.1 ng/mL (ref 0.0–4.0)

## 2019-11-30 ENCOUNTER — Encounter: Payer: Self-pay | Admitting: Family Medicine

## 2019-12-02 ENCOUNTER — Other Ambulatory Visit: Payer: Self-pay | Admitting: Family Medicine

## 2019-12-21 ENCOUNTER — Encounter: Payer: Self-pay | Admitting: Family Medicine

## 2019-12-26 ENCOUNTER — Encounter: Payer: Self-pay | Admitting: Family Medicine

## 2019-12-31 ENCOUNTER — Telehealth: Payer: Self-pay | Admitting: Family Medicine

## 2019-12-31 NOTE — Telephone Encounter (Signed)
This concern is exploding across the system. There is a modifier to add, if someone in the bldg does not know about this, libby contact your superiors in billing and find out hw and let everyone else know how to do it and why

## 2019-12-31 NOTE — Telephone Encounter (Signed)
Patient's wife calling because patient had a chest xray after he had Covid and insurance is applying it to his deductible.  She said that if we added t he diagnosis of Covid (or history of Covid) to the order that the insurace will cover it at 100%.  Looks like the diagnosis that was used on the order was chest pain and sob.  I am not sure how to change this?  *Wife's echo was ordered with a history of covid as the diagnosis so she will have to check with her insurance to see why they didn't pay that one or the billing office to make sure the echo was billed with that code.

## 2020-01-12 ENCOUNTER — Telehealth: Payer: Self-pay | Admitting: Family Medicine

## 2020-01-12 ENCOUNTER — Telehealth: Payer: Self-pay | Admitting: Internal Medicine

## 2020-01-12 MED ORDER — METRONIDAZOLE 500 MG PO TABS: ORAL_TABLET | ORAL | 0 refills | Status: DC

## 2020-01-12 MED ORDER — CIPROFLOXACIN HCL 500 MG PO TABS: ORAL_TABLET | ORAL | 0 refills | Status: DC

## 2020-01-12 NOTE — Telephone Encounter (Signed)
Diverticulitis since Friday, started a liquid diet Saturday w/o any relief H/a, stomach pain on left side, diarrhea. no fever.  Tried to call Gertie Fey doctor but he is out of town this week.  No available appts today and very few tomorrow.

## 2020-01-12 NOTE — Telephone Encounter (Signed)
Pt contacted and verbalized understanding. Pt states he his having symptoms that he has not had before and was walking out the door to go to the ER. Pt asked were we sending in Flagyl and Cipro. Informed pt that those were the meds we were calling in. Pt states that is wonderful. Informed pt that if he get worse or feels he needs to be seen, to go to ER. Pt verbalized understanding.

## 2020-01-12 NOTE — Telephone Encounter (Signed)
Pt called back to request advice for diverticulitis flare.

## 2020-01-12 NOTE — Telephone Encounter (Signed)
Metronid 500 tid ten d  Cipro 500 bid ten d  If cthis calms down pain, great, if not rec contacting gi

## 2020-01-12 NOTE — Telephone Encounter (Signed)
Please advise. Thank you

## 2020-01-13 NOTE — Telephone Encounter (Signed)
Left message for pt to call back  °

## 2020-01-14 NOTE — Telephone Encounter (Signed)
Pt called stating he was returning your call please call pt at [phone]

## 2020-01-14 NOTE — Telephone Encounter (Signed)
Pt called returning your call 

## 2020-01-14 NOTE — Telephone Encounter (Signed)
Left message for pt to call back  °

## 2020-01-15 ENCOUNTER — Ambulatory Visit (HOSPITAL_COMMUNITY)
Admission: RE | Admit: 2020-01-15 | Discharge: 2020-01-15 | Disposition: A | Discharge status: Home / Self Care | Payer: BC Managed Care – PPO | Source: Ambulatory Visit | Attending: Family Medicine | Admitting: Family Medicine

## 2020-01-15 ENCOUNTER — Encounter: Payer: Self-pay | Admitting: Family Medicine

## 2020-01-15 ENCOUNTER — Other Ambulatory Visit: Payer: Self-pay

## 2020-01-15 ENCOUNTER — Ambulatory Visit: Payer: BC Managed Care – PPO | Admitting: Family Medicine

## 2020-01-15 ENCOUNTER — Other Ambulatory Visit (HOSPITAL_COMMUNITY)
Admission: RE | Admit: 2020-01-15 | Discharge: 2020-01-15 | Disposition: A | Discharge status: Home / Self Care | Payer: BC Managed Care – PPO | Source: Ambulatory Visit | Attending: Family Medicine | Admitting: Family Medicine

## 2020-01-15 DIAGNOSIS — R109 Unspecified abdominal pain: Secondary | ICD-10-CM | POA: Insufficient documentation

## 2020-01-15 DIAGNOSIS — R197 Diarrhea, unspecified: Secondary | ICD-10-CM | POA: Diagnosis not present

## 2020-01-15 LAB — BASIC METABOLIC PANEL
Anion gap: 15 (ref 5–15)
BUN: 11 mg/dL (ref 6–20)
CO2: 21 mmol/L — ABNORMAL LOW (ref 22–32)
Calcium: 9.4 mg/dL (ref 8.9–10.3)
Chloride: 99 mmol/L (ref 98–111)
Creatinine, Ser: 0.89 mg/dL (ref 0.61–1.24)
GFR calc Af Amer: >60 mL/min (ref >60)
GFR calc non Af Amer: >60 mL/min (ref >60)
Glucose, Bld: 87 mg/dL (ref 70–99)
Potassium: 3.9 mmol/L (ref 3.5–5.1)
Sodium: 135 mmol/L (ref 135–145)

## 2020-01-15 LAB — CBC WITH DIFFERENTIAL/PLATELET
Abs Immature Granulocytes: 0.01 10*3/uL (ref 0.00–0.07)
Basophils Absolute: 0 10*3/uL (ref 0.0–0.1)
Basophils Relative: 1 %
Eosinophils Absolute: 0.1 10*3/uL (ref 0.0–0.5)
Eosinophils Relative: 2 %
HCT: 44.7 % (ref 39.0–52.0)
Hemoglobin: 15.2 g/dL (ref 13.0–17.0)
Immature Granulocytes: 0 %
Lymphocytes Relative: 28 %
Lymphs Abs: 1.5 10*3/uL (ref 0.7–4.0)
MCH: 30.1 pg (ref 26.0–34.0)
MCHC: 34 g/dL (ref 30.0–36.0)
MCV: 88.5 fL (ref 80.0–100.0)
Monocytes Absolute: 0.4 10*3/uL (ref 0.1–1.0)
Monocytes Relative: 8 %
Neutro Abs: 3.2 10*3/uL (ref 1.7–7.7)
Neutrophils Relative %: 61 %
Platelets: 247 10*3/uL (ref 150–400)
RBC: 5.05 MIL/uL (ref 4.22–5.81)
RDW: 13.5 % (ref 11.5–15.5)
WBC: 5.2 10*3/uL (ref 4.0–10.5)
nRBC: 0 % (ref 0.0–0.2)

## 2020-01-15 LAB — HEPATIC FUNCTION PANEL
ALT: 46 U/L — ABNORMAL HIGH (ref 0–44)
AST: 40 U/L (ref 15–41)
Albumin: 5.1 g/dL — ABNORMAL HIGH (ref 3.5–5.0)
Alkaline Phosphatase: 47 U/L (ref 38–126)
Bilirubin, Direct: 0.1 mg/dL (ref 0.0–0.2)
Indirect Bilirubin: 0.9 mg/dL (ref 0.3–0.9)
Total Bilirubin: 1 mg/dL (ref 0.3–1.2)
Total Protein: 7.9 g/dL (ref 6.5–8.1)

## 2020-01-15 MED ORDER — IOHEXOL 300 MG/ML  SOLN
100.0000 mL | Freq: Once | INTRAMUSCULAR | Status: AC | PRN
  Administered 2020-01-15: 100 mL via INTRAVENOUS

## 2020-01-15 NOTE — Telephone Encounter (Signed)
Pls call pt. He states that his PCP told him that he needs to see Dr. Hilarie Fredrickson next week because of his diverticulitis. I offered an appt with an APP but he stated that when he established with Dr. Hilarie Fredrickson he was told that he would be available within 2 days if he needed him for an emergency.

## 2020-01-15 NOTE — Progress Notes (Signed)
   Subjective:    Patient ID: William [name], male    DOB: [birth year], 54 y.o.   MRN: [id number]  Abdominal Pain Episode onset: 2 months. The pain is located in the generalized abdominal region. Associated symptoms include headaches and nausea. Treatments tried: started flagyl and cipro 3 days ago.   Has been having a raspy, smoky feeling in throat and nose for at least one month. Pt states it comes on after eating. Pt thinks it may be related to covid. Had covid back in December.  No sig hoarseness  Throat  Noting phlegm sometimes  Allergies acting up  Using allergy symptoms   paiin and tend in thel left lowr adomen  Had sig low back pain  Started sat morn   Pt feels tenderness and cramping sensation with it  No fever  Some nausea  Results for orders placed or performed in visit on 11/18/19  Lipid panel  Result Value Ref Range   Cholesterol, Total 202 (H) 100 - 199 mg/dL   Triglycerides 143 0 - 149 mg/dL   HDL 51 >39 mg/dL   VLDL Cholesterol Cal 25 5 - 40 mg/dL   LDL Chol Calc (NIH) 126 (H) 0 - 99 mg/dL   Chol/HDL Ratio 4.0 0.0 - 5.0 ratio  Hepatic function panel  Result Value Ref Range   Total Protein 7.2 6.0 - 8.5 g/dL   Albumin 4.9 3.8 - 4.9 g/dL   Bilirubin Total 0.5 0.0 - 1.2 mg/dL   Bilirubin, Direct 0.12 0.00 - 0.40 mg/dL   Alkaline Phosphatase 70 39 - 117 IU/L   AST 39 0 - 40 IU/L   ALT 50 (H) 0 - 44 IU/L  Basic metabolic panel  Result Value Ref Range   Glucose 101 (H) 65 - 99 mg/dL   BUN 23 6 - 24 mg/dL   Creatinine, Ser 0.81 0.76 - 1.27 mg/dL   GFR calc non Af Amer 100 >59 mL/min/1.73   GFR calc Af Amer 116 >59 mL/min/1.73   BUN/Creatinine Ratio 28 (H) 9 - 20   Sodium 140 134 - 144 mmol/L   Potassium 4.3 3.5 - 5.2 mmol/L   Chloride 101 96 - 106 mmol/L   CO2 22 20 - 29 mmol/L   Calcium 10.0 8.7 - 10.2 mg/dL  PSA  Result Value Ref Range   Prostate Specific Ag, Serum 2.1 0.0 - 4.0 ng/mL     Review of Systems  Gastrointestinal: Positive for  abdominal pain and nausea.  Neurological: Positive for headaches.       Objective:   Physical Exam  Alert active good hydration HEENT neck supple.  Lungs clear.  Heart regular rate and rhythm  Abdomen mild tenderness upper abdomen substantial tenderness lower abdomen primarily left lower quadrant.  Bowel sounds present        Assessment & Plan:  Impression history of recurrent diverticulitis.  History of abscess.  History of surgery.  With pain very significant and reflective of patient's past diverticulitis.  Recommend stat blood work and scan.  No scan revealed no element of diverticulitis.  Mild prostate hypertrophy persisted.  Advised to maintain Cipro and follow-up  Greater than 50% of this 30 minute face to face visit was spent in counseling and discussion and coordination of care regarding the above diagnosis/diagnosies

## 2020-01-15 NOTE — Telephone Encounter (Signed)
Called and left another message for pt to call back

## 2020-01-19 NOTE — Telephone Encounter (Signed)
Pt saw his PCP.

## 2020-01-20 ENCOUNTER — Other Ambulatory Visit: Payer: Self-pay

## 2020-01-20 ENCOUNTER — Encounter: Payer: Self-pay | Admitting: Family Medicine

## 2020-01-20 ENCOUNTER — Ambulatory Visit: Payer: BC Managed Care – PPO | Admitting: Family Medicine

## 2020-01-20 VITALS — BP 118/76 | Temp 97.6°F | Wt 219.6 lb

## 2020-01-20 DIAGNOSIS — I10 Essential (primary) hypertension: Secondary | ICD-10-CM | POA: Diagnosis not present

## 2020-01-20 DIAGNOSIS — R0602 Shortness of breath: Secondary | ICD-10-CM | POA: Diagnosis not present

## 2020-01-20 DIAGNOSIS — S76212A Strain of adductor muscle, fascia and tendon of left thigh, initial encounter: Secondary | ICD-10-CM

## 2020-01-20 DIAGNOSIS — R109 Unspecified abdominal pain: Secondary | ICD-10-CM | POA: Diagnosis not present

## 2020-01-20 DIAGNOSIS — N400 Enlarged prostate without lower urinary tract symptoms: Secondary | ICD-10-CM | POA: Diagnosis not present

## 2020-01-20 NOTE — Progress Notes (Signed)
   Subjective:    Patient ID: William [name], male    DOB: 08-Jan-1966, 54 y.o.   MRN: [id number]  HPI Patient comes in today for a follow up on his abdominal pain. Patient states he is feeling much better. He has started trying to eat soft solid foods and does still have some pain/stomach issues but much better than it was.   Patient also mentioned he has been getting a light headed feeling upon standing and would like to discuss his blood pressure medications.   Blood pressure overall good.  Patient does get lightheaded when standing quickly.  Compliant with medication.  Notes quite a bit of increased stress recently.  He thinks this may have something do with his recent abdominal pain.  No dysuria no increased frequency no fever no chills  Scan results were discussed today see a prior scan last week  Review of Systems See above    Objective:   Physical Exam  Alert and oriented, vitals reviewed and stable, NAD ENT-TM's and ext canals WNL bilat via otoscopic exam Soft palate, tonsils and post pharynx WNL via oropharyngeal exam Neck-symmetric, no masses; thyroid nonpalpable and nontender Pulmonary-no tachypnea or accessory muscle use; Clear without wheezes via auscultation Card--no abnrml murmurs, rhythm reg and rate WNL Carotid pulses symmetric, without bruits       Assessment & Plan:  Impression 1 hypertension.  Excellent control discussed to maintain same meds warning signs discussed regarding orthostatic hypotension.  2.  Abdominal pain recurrent.  Likely an element of IBS.  Exacerbated by stress  3.  Stress patient has what kind of medicines will use if he ever came to need it.  Discussed serotonin reuptake inhibitors and Wellbutrin  4.  Vaccine questions.  Due to have coronavirus vaccine #1 tomorrow 105 days post Covid wonders if he can do this and still move out of his house Thursday and Friday.  Recommend holding first vaccines next week  Follow-up 6 months diet  exercise discussed medications refilled

## 2020-01-21 ENCOUNTER — Other Ambulatory Visit: Payer: Self-pay | Admitting: Family Medicine

## 2020-01-21 ENCOUNTER — Telehealth: Payer: Self-pay | Admitting: Family Medicine

## 2020-01-21 NOTE — Telephone Encounter (Signed)
Patient is requesting refill on Ambien 10 mg called into [place]

## 2020-01-21 NOTE — Telephone Encounter (Signed)
6 mo 

## 2020-01-21 NOTE — Telephone Encounter (Signed)
Med check up 10/07/19

## 2020-01-22 ENCOUNTER — Other Ambulatory Visit: Payer: Self-pay | Admitting: *Deleted

## 2020-01-22 MED ORDER — ZOLPIDEM TARTRATE 10 MG PO TABS: 10.0000 mg | ORAL_TABLET | Freq: Every day | ORAL | 5 refills | Status: DC

## 2020-01-22 NOTE — Telephone Encounter (Signed)
Rx faxed and Pt notified.

## 2020-01-22 NOTE — Telephone Encounter (Signed)
Script printed. Await dr signature then will fax and call pt

## 2020-01-29 ENCOUNTER — Other Ambulatory Visit: Payer: Self-pay

## 2020-01-29 ENCOUNTER — Other Ambulatory Visit (HOSPITAL_COMMUNITY): Payer: Self-pay | Admitting: Orthopedic Surgery

## 2020-01-29 ENCOUNTER — Ambulatory Visit (HOSPITAL_COMMUNITY)
Admission: RE | Admit: 2020-01-29 | Discharge: 2020-01-29 | Disposition: A | Discharge status: Home / Self Care | Payer: BC Managed Care – PPO | Source: Ambulatory Visit | Attending: Orthopedic Surgery | Admitting: Orthopedic Surgery

## 2020-01-29 DIAGNOSIS — M7989 Other specified soft tissue disorders: Secondary | ICD-10-CM | POA: Diagnosis not present

## 2020-01-29 DIAGNOSIS — M25562 Pain in left knee: Secondary | ICD-10-CM | POA: Diagnosis not present

## 2020-01-29 DIAGNOSIS — M79604 Pain in right leg: Secondary | ICD-10-CM | POA: Diagnosis not present

## 2020-01-29 NOTE — Progress Notes (Signed)
VASCULAR LAB PRELIMINARY  PRELIMINARY  PRELIMINARY  PRELIMINARY  Bilateral lower extremity venous duplex completed.    Preliminary report:  See CV proc for preliminary results.  Called report to [facility], [place], [facility]T 01/29/2020, 2:26 PM

## 2020-02-17 ENCOUNTER — Other Ambulatory Visit: Payer: Self-pay

## 2020-02-17 ENCOUNTER — Ambulatory Visit: Payer: BC Managed Care – PPO | Admitting: Family Medicine

## 2020-02-17 VITALS — BP 128/82 | Temp 97.2°F | Ht 73.0 in | Wt 214.2 lb

## 2020-02-17 DIAGNOSIS — K469 Unspecified abdominal hernia without obstruction or gangrene: Secondary | ICD-10-CM

## 2020-02-17 NOTE — Progress Notes (Signed)
   Subjective:    Patient ID: William [name], male    DOB: [birth year], 54 y.o.   MRN: [id number]  HPI  Patient arrives with a lump in his lower abdomen that comes and goes since Sunday. Patient states he had a hard know appear Sunday that lasted 10 minutes and went away but he can still feel it if he coughs.   Felt an area in January, feeling soe discmfort   Felt a knot come up   Felt rock hard  Notes tenderness   And discomfort   swollen   withc coughing  Noted  Swelling and disomfort   Seemed to be worse with constipation like sensation   Some sensitive and tenderness   Review of Systems No vomiting no headache no chest pain    Objective:   Physical Exam  Alert no acute distress.  Lungs clear.  Heart regular rate and rhythm.  Abdomen.  Old scars noted.  Some left lower quadrant tenderness to deep palpation.  No palpable abnormality.      Assessment & Plan:  Impression recurrent visits for concern regarding abdominal hernia.  I have yet to identify one on exam.  With history of multiple surgeries and multiple presentations recommend someone more capable than I am looking at patient to assess for potential abdominal wall hernia.  Referral back to [place] [facility] for assessment

## 2020-02-18 ENCOUNTER — Encounter: Payer: Self-pay | Admitting: Family Medicine

## 2020-02-27 ENCOUNTER — Ambulatory Visit: Payer: BC Managed Care – PPO

## 2020-03-18 DIAGNOSIS — K4090 Unilateral inguinal hernia, without obstruction or gangrene, not specified as recurrent: Secondary | ICD-10-CM | POA: Diagnosis not present

## 2020-03-18 DIAGNOSIS — R1032 Left lower quadrant pain: Secondary | ICD-10-CM | POA: Diagnosis not present

## 2020-03-18 DIAGNOSIS — Z9049 Acquired absence of other specified parts of digestive tract: Secondary | ICD-10-CM | POA: Diagnosis not present

## 2020-03-23 ENCOUNTER — Other Ambulatory Visit: Payer: Self-pay | Admitting: Family Medicine

## 2020-03-23 NOTE — Telephone Encounter (Signed)
lvm to schedule appt.  

## 2020-03-23 NOTE — Telephone Encounter (Signed)
Patient scheduled appt for 04/14/20.

## 2020-03-23 NOTE — Telephone Encounter (Signed)
Please schedule med check and then route back to nurses. Last med check 10/07/19

## 2020-04-14 ENCOUNTER — Other Ambulatory Visit: Payer: Self-pay

## 2020-04-14 ENCOUNTER — Ambulatory Visit: Payer: BC Managed Care – PPO | Admitting: Family Medicine

## 2020-04-14 ENCOUNTER — Encounter: Payer: Self-pay | Admitting: Family Medicine

## 2020-04-14 VITALS — BP 130/72 | HR 81 | Temp 97.4°F | Ht 73.0 in | Wt 215.4 lb

## 2020-04-14 DIAGNOSIS — E785 Hyperlipidemia, unspecified: Secondary | ICD-10-CM | POA: Diagnosis not present

## 2020-04-14 DIAGNOSIS — M25512 Pain in left shoulder: Secondary | ICD-10-CM

## 2020-04-14 DIAGNOSIS — M25511 Pain in right shoulder: Secondary | ICD-10-CM | POA: Diagnosis not present

## 2020-04-14 DIAGNOSIS — Z8616 Personal history of COVID-19: Secondary | ICD-10-CM | POA: Insufficient documentation

## 2020-04-14 DIAGNOSIS — I10 Essential (primary) hypertension: Secondary | ICD-10-CM

## 2020-04-14 DIAGNOSIS — H60333 Swimmer's ear, bilateral: Secondary | ICD-10-CM

## 2020-04-14 DIAGNOSIS — G8929 Other chronic pain: Secondary | ICD-10-CM

## 2020-04-14 DIAGNOSIS — G4700 Insomnia, unspecified: Secondary | ICD-10-CM | POA: Diagnosis not present

## 2020-04-14 MED ORDER — NEOMYCIN-POLYMYXIN-HC 3.5-10000-1 OT SUSP: OTIC | 0 refills | Status: DC

## 2020-04-14 NOTE — Progress Notes (Signed)
Patient ID: William [name], male    DOB: 11/08/1965, 54 y.o.   MRN: [id number]   Chief Complaint  Patient presents with  . Hypertension    Pt here for refills on Lisinopril 5 mg. Pt is taking Ambien but is still not able to get a good night sleep. Pt has significant hernia, pain and stress. Pt would like to try short course of Ambien 12.5 mg. Pt use to take 12.5 and it did help but made him groggy but was able to sleep. 2 3 mg melatonins each night also. Not checking BP outside of office as much. No issues or side effects with med    Subjective:    HPI  CC- insomnia Pt having inc with ambien 89m and was on 12.56mCr. Pt has tried trazodone, having "hungover" feeling with it next day.  Working for him on 1065mmbien in past but not feeling working now. Taking ibuporfen 800m63m night. Getting injections next week in both shoulders.  Pt thinks this may help with is pain and waking up in the night. Has tried voltaren in past and had lumbar disc issues in past. Didn't help with voltaren gel.  Then had tramadol in past and occ helps with pain.  H/o diverticulitis and needing resection due to abscess.  And now having hernia on left lower side and having laproscopic hernia repair 2 wks.   Medical History [place] a past medical history of Difficulty sleeping, Diverticulitis of intestine with perforation and abscess, Diverticulosis, Dysuria, ED (erectile dysfunction), GERD (gastroesophageal reflux disease), Hyperlipidemia, Hypertension, Osteoarthritis, Pancreatitis, acute, PONV (postoperative nausea and vomiting), Prostatitis, and Reflux.   Outpatient Encounter Medications as of 04/14/2020  Medication Sig  . amLODipine (NORVASC) 10 MG tablet Take one tablet by mouth at bedtime  . fluticasone (FLONASE) 50 MCG/ACT nasal spray SPRAY 2 SPRAYS INTO EACH NOSTRIL EVERY DAY  . lisinopril (ZESTRIL) 5 MG tablet Take 1 tablet by mouth once daily  . lovastatin (MEVACOR) 40 MG tablet Take 1 tablet  (40 mg total) by mouth at bedtime.  . Multiple Vitamins-Minerals (ALIVE MENS ENERGY PO) Take 1 tablet by mouth daily.  . neMarland Kitchenmycin-polymyxin-hydrocortisone (CORTISPORIN) 3.5-10000-1 OTIC suspension Place 3 drops in the affected ear four times daily  . omeprazole (PRILOSEC) 40 MG capsule Take 1 capsule (40 mg total) by mouth daily.  . ondansetron (ZOFRAN ODT) 4 MG disintegrating tablet Place one tablet under tongue every 6 hrs prn nausea  . zolpidem (AMBIEN) 10 MG tablet Take 1 tablet (10 mg total) by mouth at bedtime.  . [DISCONTINUED] neomycin-polymyxin-hydrocortisone (CORTISPORIN) 3.5-10000-1 OTIC suspension Place 3 drops in the affected ear four times daily   No facility-administered encounter medications on file as of 04/14/2020.     Review of Systems  Constitutional: Negative for chills and fever.  HENT: Negative for congestion, rhinorrhea and sore throat.   Respiratory: Negative for cough, shortness of breath and wheezing.   Cardiovascular: Negative for chest pain and leg swelling.  Gastrointestinal: Negative for abdominal pain, diarrhea, nausea and vomiting.  Genitourinary: Negative for dysuria and frequency.  Skin: Negative for rash.  Neurological: Negative for dizziness, weakness and headaches.  Psychiatric/Behavioral: Positive for sleep disturbance.     Vitals BP 130/72   Pulse 81   Temp (!) 97.4 F (36.3 C)   Ht 6' 1"  (1.854 m)   Wt 215 lb 6.4 oz (97.7 kg)   SpO2 97%   BMI 28.42 kg/m   Objective:   Physical Exam Vitals and nursing  note reviewed.  Constitutional:      General: He is not in acute distress.    Appearance: Normal appearance. He is not ill-appearing.  HENT:     Head: Normocephalic.     Nose: Nose normal. No congestion.     Mouth/Throat:     Mouth: Mucous membranes are moist.     Pharynx: No oropharyngeal exudate.  Eyes:     Extraocular Movements: Extraocular movements intact.     Conjunctiva/sclera: Conjunctivae normal.     Pupils: Pupils are  equal, round, and reactive to light.  Cardiovascular:     Rate and Rhythm: Normal rate and regular rhythm.     Pulses: Normal pulses.     Heart sounds: Normal heart sounds. No murmur heard.   Pulmonary:     Effort: Pulmonary effort is normal.     Breath sounds: Normal breath sounds. No wheezing, rhonchi or rales.  Musculoskeletal:        General: Normal range of motion.     Right lower leg: No edema.     Left lower leg: No edema.  Skin:    General: Skin is warm and dry.     Findings: No rash.  Neurological:     General: No focal deficit present.     Mental Status: He is alert and oriented to person, place, and time.     Cranial Nerves: No cranial nerve deficit.  Psychiatric:        Mood and Affect: Mood normal.        Behavior: Behavior normal.        Thought Content: Thought content normal.        Judgment: Judgment normal.      Assessment and Plan   1. Essential hypertension - CBC; Future - CMP14+EGFR; Future  2. Hyperlipidemia LDL goal <130 - CBC; Future - Lipid panel; Future  3. Chronic pain of both shoulders  4. Insomnia, unspecified type  5. Swimmer's ear of both sides, unspecified chronicity - neomycin-polymyxin-hydrocortisone (CORTISPORIN) 3.5-10000-1 OTIC suspension; Place 3 drops in the affected ear four times daily  Dispense: 10 mL; Refill: 0  6. History of COVID-19   HTN- stable, cont meds.   Hernia-LLQ-  Cont with miralax for help with hernia and reducing constipation.  Pt going to have 2 wks laproscopic surgery. For left lower abd hernia. [place] [facility]   Insomnia- Not due to get Ambien at this time, due in 10/21. Pt stating isn't not working as well as it used to.  Wanting to stick with it for now.  Also taking 57m melatonin. May want to try another med in future.  Wanting to call [facility] 2 wks after shoulder injections to see if may need something for pain to help with this sleep. (tramadol has worked for him in past.)  Swimmer's ear-  getting cortisporin 1x per yr for ear and swimming.  Needing refill.   -chronic shoulder pain- f/u ortho as scheduled for shoulder injections.  -covid injection in 12/20. Had a long complicated course and recovered now. Lost about 23 lbs in 2 wks due to the illness.  Not having any smell back.  F/u [id number] prn.

## 2020-04-20 DIAGNOSIS — M25511 Pain in right shoulder: Secondary | ICD-10-CM | POA: Diagnosis not present

## 2020-04-20 DIAGNOSIS — M25512 Pain in left shoulder: Secondary | ICD-10-CM | POA: Diagnosis not present

## 2020-04-29 DIAGNOSIS — R1032 Left lower quadrant pain: Secondary | ICD-10-CM | POA: Diagnosis not present

## 2020-04-29 DIAGNOSIS — Z9049 Acquired absence of other specified parts of digestive tract: Secondary | ICD-10-CM | POA: Diagnosis not present

## 2020-05-06 ENCOUNTER — Telehealth: Payer: Self-pay | Admitting: Internal Medicine

## 2020-05-06 NOTE — Telephone Encounter (Signed)
Hi Linda, we have received a referral from Dr. Harlow Asa at [place] for LLQ abd pain and swelling. I spoke with pt who stated to have been having issues since he got Covid. Pt had partial colectomy few years ago. Pt stated that Dr. Harlow Asa wants him to have a colonoscopy in 1 to 2 weeks. I will be sending records to Dr. Hilarie Fredrickson for review. Please advise on scheduling.

## 2020-05-06 NOTE — Telephone Encounter (Signed)
Dr. Norman Herrlich, see below. Will place records in your IN basket for review. Per Dr. Harlow Asa "it has been 3-4 years since patient's last colon, he thinks that it wil be worthwhile to repeat colon to evaluate previous anastomosis and to evaluate for new findings that may explain patient's symptoms."  Please advise, thank you

## 2020-05-09 NOTE — Telephone Encounter (Signed)
If patient is comfortable we can proceed to colonoscopy in the [facility] to evaluate recurrent LLQ pain Ok for office visit 1st if patient prefers

## 2020-05-10 MED ORDER — HYOSCYAMINE SULFATE 0.125 MG PO TABS: 0.1250 mg | ORAL_TABLET | Freq: Four times a day (QID) | ORAL | 0 refills | Status: DC | PRN

## 2020-05-10 MED ORDER — ONDANSETRON 4 MG PO TBDP: ORAL_TABLET | ORAL | 0 refills | Status: DC

## 2020-05-10 MED ORDER — ONDANSETRON 4 MG PO TBDP: 4.0000 mg | ORAL_TABLET | Freq: Three times a day (TID) | ORAL | 0 refills | Status: DC | PRN

## 2020-05-10 NOTE — Telephone Encounter (Signed)
The pt has been scheduled to come in tomorrow and see Dr Hilarie Fredrickson.  He will discuss colon.

## 2020-05-11 ENCOUNTER — Other Ambulatory Visit (INDEPENDENT_AMBULATORY_CARE_PROVIDER_SITE_OTHER): Payer: BC Managed Care – PPO

## 2020-05-11 ENCOUNTER — Ambulatory Visit (INDEPENDENT_AMBULATORY_CARE_PROVIDER_SITE_OTHER): Payer: BC Managed Care – PPO | Admitting: Internal Medicine

## 2020-05-11 ENCOUNTER — Encounter: Payer: Self-pay | Admitting: Internal Medicine

## 2020-05-11 VITALS — BP 118/80 | Ht 73.0 in | Wt 216.4 lb

## 2020-05-11 DIAGNOSIS — K5900 Constipation, unspecified: Secondary | ICD-10-CM

## 2020-05-11 DIAGNOSIS — Z8719 Personal history of other diseases of the digestive system: Secondary | ICD-10-CM

## 2020-05-11 DIAGNOSIS — R1032 Left lower quadrant pain: Secondary | ICD-10-CM

## 2020-05-11 DIAGNOSIS — R194 Change in bowel habit: Secondary | ICD-10-CM

## 2020-05-11 DIAGNOSIS — R6881 Early satiety: Secondary | ICD-10-CM

## 2020-05-11 LAB — CBC WITH DIFFERENTIAL/PLATELET
Basophils Absolute: 0 10*3/uL (ref 0.0–0.1)
Basophils Relative: 0.5 % (ref 0.0–3.0)
Eosinophils Absolute: 0.1 10*3/uL (ref 0.0–0.7)
Eosinophils Relative: 1.7 % (ref 0.0–5.0)
HCT: 42.7 % (ref 39.0–52.0)
Hemoglobin: 14.6 g/dL (ref 13.0–17.0)
Lymphocytes Relative: 25.8 % (ref 12.0–46.0)
Lymphs Abs: 1.8 10*3/uL (ref 0.7–4.0)
MCHC: 34.2 g/dL (ref 30.0–36.0)
MCV: 88.5 fl (ref 78.0–100.0)
Monocytes Absolute: 0.5 10*3/uL (ref 0.1–1.0)
Monocytes Relative: 6.9 % (ref 3.0–12.0)
Neutro Abs: 4.6 10*3/uL (ref 1.4–7.7)
Neutrophils Relative %: 65.1 % (ref 43.0–77.0)
Platelets: 223 10*3/uL (ref 150.0–400.0)
RBC: 4.83 Mil/uL (ref 4.22–5.81)
RDW: 14.3 % (ref 11.5–15.5)
WBC: 7.1 10*3/uL (ref 4.0–10.5)

## 2020-05-11 LAB — COMPREHENSIVE METABOLIC PANEL
ALT: 25 U/L (ref 0–53)
AST: 20 U/L (ref 0–37)
Albumin: 4.9 g/dL (ref 3.5–5.2)
Alkaline Phosphatase: 59 U/L (ref 39–117)
BUN: 17 mg/dL (ref 6–23)
CO2: 28 mEq/L (ref 19–32)
Calcium: 9.7 mg/dL (ref 8.4–10.5)
Chloride: 100 mEq/L (ref 96–112)
Creatinine, Ser: 0.85 mg/dL (ref 0.40–1.50)
GFR: 93.11 mL/min (ref >60.00)
Glucose, Bld: 95 mg/dL (ref 70–99)
Potassium: 3.6 mEq/L (ref 3.5–5.1)
Sodium: 136 mEq/L (ref 135–145)
Total Bilirubin: 0.8 mg/dL (ref 0.2–1.2)
Total Protein: 7.7 g/dL (ref 6.0–8.3)

## 2020-05-11 MED ORDER — SUTAB 1479-225-188 MG PO TABS: 1.0000 | ORAL_TABLET | ORAL | 0 refills | Status: DC

## 2020-05-11 NOTE — Patient Instructions (Signed)
Your provider has requested that you go to the basement level for lab work before leaving today. Press "B" on the elevator. The lab is located at the first door on the left as you exit the elevator.  You have been scheduled for an endoscopy and colonoscopy. Please follow the written instructions given to you at your visit today. Please pick up your prep supplies at the pharmacy within the next 1-3 days. If you use inhalers (even only as needed), please bring them with you on the day of your procedure.   Continue Miralax twice daily.  Continue Levsin as needed.  Continue Zofran as needed.  If you are age 31 or older, your body mass index should be between 23-30. Your Body mass index is 28.55 kg/m. If this is out of the aforementioned range listed, please consider follow up with your Primary Care Provider.  If you are age 9 or younger, your body mass index should be between 19-25. Your Body mass index is 28.55 kg/m. If this is out of the aformentioned range listed, please consider follow up with your Primary Care Provider.   Due to recent changes in healthcare laws, you may see the results of your imaging and laboratory studies on MyChart before your provider has had a chance to review them.  We understand that in some cases there may be results that are confusing or concerning to you. Not all laboratory results come back in the same time frame and the provider may be waiting for multiple results in order to interpret others.  Please give [place] 48 hours in order for your provider to thoroughly review all the results before contacting the office for clarification of your results.

## 2020-05-11 NOTE — Progress Notes (Signed)
Subjective:    Patient ID: William [name], male    DOB: [birth year], 53 y.o.   MRN: [id number]  HPI William [name] is a 54 year old male with a history of colonic diverticulosis and complicated diverticulitis status post sigmoid resection, prior pancreatitis and elevated liver enzymes felt secondary to sphincter of Oddi dysfunction with remote sphincterotomy at [name] by Dr. Emmit Pomfret, history of GERD, BPH, hypertension and hyperlipidemia who is seen in follow-up.  He is seen at the request of Dr. Harlow Asa to evaluate left lower quadrant abdominal pain and change in bowel habits.  He is here alone today.  He reports that over the last 3 or 4 months he has developed recurrent left lower quadrant abdominal pain.  On several occasions he has had intense left lower quadrant abdominal pain associated with the visible abdominal swelling in the left lower quadrant.  On these severe occasions he can have associated nausea and vomiting.  It has progressed to being uncomfortable in the left lower quadrant on a daily basis worse if he sneezes, burps or coughs.  He has had a change in his bowel habits where previously he was quite regular he has now become constipated and "backed up feeling".  He is started on MiraLAX 17 g twice daily and this has prevented the severe attacks of left lower quadrant pain and swelling though he has continued to have persistent left lower quadrant discomfort.  He is having more regular bowel movements and not the small hard balls he was having prior to MiraLAX initiation.  Levsin and Zofran have been helpful on occasion.  He has not seen blood in his stool or melena.  He has had some upper abdominal pain and early fullness.  His last colonoscopy was performed by Dr. Gala Romney on 06/09/2015.  This revealed a well-healed surgical anastomosis at 20 cm from the anal verge.  There was a few colonic diverticula distal to the anastomosis but the colon was otherwise normal.  This exam was to the terminal  ileum.  He did have a recent CT scan which did not show overt pathology.   Review of Systems As per HPI, otherwise negative  Current Medications, Allergies, Past Medical History, Past Surgical History, Family History and Social History were reviewed in [employer] record.     Objective:   Physical Exam BP 118/80 (BP Location: Right Arm, Patient Position: Sitting, Cuff Size: Normal)   Ht 6\' 1"  (1.854 m)   Wt (!) 216 lb 6 oz (98.1 kg)   SpO2 97%   BMI 28.55 kg/m  Gen: awake, alert, NAD HEENT: anicteric CV: RRR, no mrg Pulm: CTA b/l Abd: soft, mild tenderness in both the right and left lower quadrant without rebound or guarding, nondistended, slightly hyperactive bowel sounds throughout Ext: no c/c/e Neuro: nonfocal  CT ABDOMEN AND PELVIS WITH CONTRAST   TECHNIQUE: Multidetector CT imaging of the abdomen and pelvis was performed using the standard protocol following bolus administration of intravenous contrast.   CONTRAST:  100 mL OMNIPAQUE IOHEXOL 300 MG/ML  SOLN   COMPARISON:  CT abdomen and pelvis 11/12/2017.   FINDINGS: Lower chest: Lung bases are clear. No pleural or pericardial effusion.   Hepatobiliary: No focal liver abnormality is seen. Status post cholecystectomy. No biliary dilatation.   Pancreas: Unremarkable. No pancreatic ductal dilatation or surrounding inflammatory changes.   Spleen: Normal in size without focal abnormality.   Adrenals/Urinary Tract: Adrenal glands are unremarkable. Kidneys are normal, without renal calculi, focal lesion, or hydronephrosis.  Bladder is unremarkable.   Stomach/Bowel: Stomach is within normal limits. No evidence of bowel wall thickening, distention, or inflammatory changes. Surgical anastomosis in the sigmoid colon without complicating feature is noted. The patient is status post appendectomy.   Vascular/Lymphatic: No significant vascular findings are present. No enlarged abdominal or pelvic  lymph nodes.   Reproductive: Mild prostatomegaly is unchanged.   Other: None.   Musculoskeletal: No acute or focal abnormality.   IMPRESSION: Negative for diverticulitis. No acute abnormality or finding to explain the patient's symptoms.   Mild prostatomegaly, unchanged.     Electronically Signed   By: Inge Rise M.D.   On: 01/15/2020 14:10         Assessment & Plan:  54 year old male with a history of colonic diverticulosis and complicated diverticulitis status post sigmoid resection, prior pancreatitis and elevated liver enzymes felt secondary to sphincter of Oddi dysfunction with remote sphincterotomy at [name] by Dr. Emmit Pomfret, history of GERD, BPH, hypertension and hyperlipidemia who is seen in follow-up.   1. LLQ pain/change in bowel habits with constipation/prior sigmoid resection for diverticulitis/early fullness--his symptoms could be related to focal colonic stricture or intestinal adhesions from prior surgery.  We discussed this today.  He has benefited from Mdsine [name] and so he should continue this therapy.  I recommended direct visualization.  We will proceed with upper endoscopy and colonoscopy in the [facility].  We discussed the risk, benefits and alternatives and he is agreeable and wishes to proceed. --Upper endoscopy and colonoscopy as above --Continue MiraLAX 17 g twice daily --He can use hyoscyamine and ondansetron as prescribed as needed

## 2020-06-18 ENCOUNTER — Encounter: Payer: Self-pay | Admitting: Internal Medicine

## 2020-06-28 ENCOUNTER — Encounter: Payer: Self-pay | Admitting: Internal Medicine

## 2020-06-28 ENCOUNTER — Ambulatory Visit (AMBULATORY_SURGERY_CENTER): Payer: BC Managed Care – PPO | Admitting: Internal Medicine

## 2020-06-28 ENCOUNTER — Encounter: Payer: BC Managed Care – PPO | Admitting: Internal Medicine

## 2020-06-28 ENCOUNTER — Other Ambulatory Visit: Payer: Self-pay

## 2020-06-28 VITALS — BP 127/87 | HR 64 | Temp 96.9°F | Resp 27 | Ht 73.0 in | Wt 216.0 lb

## 2020-06-28 DIAGNOSIS — D124 Benign neoplasm of descending colon: Secondary | ICD-10-CM

## 2020-06-28 DIAGNOSIS — D120 Benign neoplasm of cecum: Secondary | ICD-10-CM | POA: Diagnosis not present

## 2020-06-28 DIAGNOSIS — R1032 Left lower quadrant pain: Secondary | ICD-10-CM

## 2020-06-28 DIAGNOSIS — R194 Change in bowel habit: Secondary | ICD-10-CM

## 2020-06-28 DIAGNOSIS — Z9049 Acquired absence of other specified parts of digestive tract: Secondary | ICD-10-CM

## 2020-06-28 DIAGNOSIS — K648 Other hemorrhoids: Secondary | ICD-10-CM

## 2020-06-28 DIAGNOSIS — R6881 Early satiety: Secondary | ICD-10-CM

## 2020-06-28 DIAGNOSIS — D125 Benign neoplasm of sigmoid colon: Secondary | ICD-10-CM

## 2020-06-28 DIAGNOSIS — K5900 Constipation, unspecified: Secondary | ICD-10-CM

## 2020-06-28 DIAGNOSIS — K5730 Diverticulosis of large intestine without perforation or abscess without bleeding: Secondary | ICD-10-CM | POA: Diagnosis not present

## 2020-06-28 DIAGNOSIS — K3189 Other diseases of stomach and duodenum: Secondary | ICD-10-CM | POA: Diagnosis not present

## 2020-06-28 MED ORDER — SODIUM CHLORIDE 0.9 % IV SOLN: 500.0000 mL | Freq: Once | INTRAVENOUS | Status: DC

## 2020-06-28 MED ORDER — LINACLOTIDE 145 MCG PO CAPS: 145.0000 ug | ORAL_CAPSULE | Freq: Every day | ORAL | 0 refills | Status: DC

## 2020-06-28 MED ORDER — ONDANSETRON 4 MG PO TBDP: 4.0000 mg | ORAL_TABLET | Freq: Three times a day (TID) | ORAL | 0 refills | Status: DC | PRN

## 2020-06-28 NOTE — Progress Notes (Signed)
Report given to PACU, vss 

## 2020-06-28 NOTE — Op Note (Addendum)
[facility] Patient Name: William [name] Procedure Date: 06/28/2020 2:42 PM MRN: [id number] Endoscopist: Jerene Bears , MD Age: 54 Referring MD:  Date of Birth: Sep 25, 1966 Gender: Male Account #: [id number] Procedure:                Colonoscopy Indications:              Abdominal pain in the left lower quadrant, change                            in bowel habits, constipation, history of                            sigmoidectomy in 2015 for diverticular                            disease/diverticulitis, early fullness, last                            colonoscopy Aug 2016 (Dr. Gala Romney) Medicines:                Monitored Anesthesia Care Procedure:                Pre-Anesthesia Assessment:                           - Prior to the procedure, a History and Physical                            was performed, and patient medications and                            allergies were reviewed. The patient's tolerance of                            previous anesthesia was also reviewed. The risks                            and benefits of the procedure and the sedation                            options and risks were discussed with the patient.                            All questions were answered, and informed consent                            was obtained. Prior Anticoagulants: The patient has                            taken no previous anticoagulant or antiplatelet                            agents. ASA Grade Assessment: II - A patient with  mild systemic disease. After reviewing the risks                            and benefits, the patient was deemed in                            satisfactory condition to undergo the procedure.                           After obtaining informed consent, the colonoscope                            was passed under direct vision. Throughout the                            procedure, the patient's blood pressure, pulse, and                             oxygen saturations were monitored continuously. The                            Colonoscope was introduced through the anus and                            advanced to the terminal ileum. The colonoscopy was                            performed without difficulty. The patient tolerated                            the procedure well. The quality of the bowel                            preparation was excellent. The terminal ileum,                            ileocecal valve, appendiceal orifice, and rectum                            were photographed. Scope In: 2:59:42 PM Scope Out: 3:16:22 PM Scope Withdrawal Time: 0 hours 14 minutes 15 seconds  Total Procedure Duration: 0 hours 16 minutes 40 seconds  Findings:                 The digital rectal exam was normal.                           The terminal ileum appeared normal.                           A 6 mm polyp was found in the cecum. The polyp was                            sessile. The polyp was removed with a cold snare.  Resection and retrieval were complete.                           A 5 mm polyp was found in the descending colon. The                            polyp was sessile. The polyp was removed with a                            cold snare. Resection and retrieval were complete.                           A 4 mm polyp was found in the recto-sigmoid colon.                            The polyp was sessile. The polyp was removed with a                            cold snare. Resection and retrieval were complete.                           There was evidence of a prior end-to-end                            colo-colonic anastomosis in the sigmoid colon. This                            was widely patent and was characterized by healthy                            appearing mucosa. There was no evidence of                            significant stenosis or upstream dilation.                            Multiple small-mouthed diverticula were found in                            the residual sigmoid colon and descending colon.                           Non-bleeding internal hemorrhoids were found during                            retroflexion. The hemorrhoids were medium-sized. Complications:            No immediate complications. Estimated Blood Loss:     Estimated blood loss was minimal. Impression:               - The examined portion of the ileum was normal.                           - One 6 mm polyp  in the cecum, removed with a cold                            snare. Resected and retrieved.                           - One 5 mm polyp in the descending colon, removed                            with a cold snare. Resected and retrieved.                           - One 4 mm polyp at the recto-sigmoid colon,                            removed with a cold snare. Resected and retrieved.                           - Patent end-to-end colo-colonic anastomosis,                            characterized by healthy appearing mucosa. The                            anastomosis appears widely patent.                           - Diverticulosis in the proximal sigmoid colon and                            in the descending colon.                           - Internal hemorrhoids. Recommendation:           - Patient has a contact number available for                            emergencies. The signs and symptoms of potential                            delayed complications were discussed with the                            patient. Return to normal activities tomorrow.                            Written discharge instructions were provided to the                            patient.                           - Resume previous diet.                           -  Continue present medications.                           - MiraLax has definitively helped with the intense                            episodic  lower abdominal pain, but somewhat                            difficult for patient to take, thus try Linzess 145                            mcg daily for constipation. Levsin 0.125 mg 1-2                            tablets can be continued every 6 hours as needed                            for intermittent cramping abdominal pain.                           - If symptoms persist I would recommend we repeat                            cross-sectional imaging. Revisit with Dr. Harlow Asa                            with [facility] to discuss abdominal                            adhesions involving the serosal colonic surface is                            advised for persistent symptoms.                           - Await pathology results.                           - Repeat colonoscopy is recommended. The                            colonoscopy date will be determined after pathology                            results from today's exam become available for                            review. Jerene Bears, MD 06/28/2020 3:33:12 PM This report has been signed electronically.

## 2020-06-28 NOTE — Patient Instructions (Signed)
UPPER ENDOSCOPY:  HANDOUT ON GASTRITIS GIVEN TO YOU TODAY  AWAIT BIOPSY RESULTS   COLONOSCOPY:  HANDOUTS ON POLYPS,DIVERTICULOSIS,& HEMORRHOIDS GIVEN TO YOU TODAY  AWAIT PATHOLOGY RESULTS ON POLYPS REMOVED     YOU HAD AN ENDOSCOPIC PROCEDURE TODAY AT [place]:   Refer to the procedure report that was given to you for any specific questions about what was found during the examination.  If the procedure report does not answer your questions, please call your gastroenterologist to clarify.  If you requested that your care partner not be given the details of your procedure findings, then the procedure report has been included in a sealed envelope for you to review at your convenience later.  YOU SHOULD EXPECT: Some feelings of bloating in the abdomen. Passage of more gas than usual.  Walking can help get rid of the air that was put into your GI tract during the procedure and reduce the bloating. If you had a lower endoscopy (such as a colonoscopy or flexible sigmoidoscopy) you may notice spotting of blood in your stool or on the toilet paper. If you underwent a bowel prep for your procedure, you may not have a normal bowel movement for a few days.  Please Note:  You might notice some irritation and congestion in your nose or some drainage.  This is from the oxygen used during your procedure.  There is no need for concern and it should clear up in a day or so.  SYMPTOMS TO REPORT IMMEDIATELY:   Following lower endoscopy (colonoscopy or flexible sigmoidoscopy):  Excessive amounts of blood in the stool  Significant tenderness or worsening of abdominal pains  Swelling of the abdomen that is new, acute  Fever of 100F or higher   Following upper endoscopy (EGD)  Vomiting of blood or coffee ground material  New chest pain or pain under the shoulder blades  Painful or persistently difficult swallowing  New shortness of breath  Fever of 100F or higher  Black,  tarry-looking stools  For urgent or emergent issues, a gastroenterologist can be reached at any hour by calling [phone]. Do not use MyChart messaging for urgent concerns.    DIET:  We do recommend a small meal at first, but then you may proceed to your regular diet.  Drink plenty of fluids but you should avoid alcoholic beverages for 24 hours.  ACTIVITY:  You should plan to take it easy for the rest of today and you should NOT DRIVE or use heavy machinery until tomorrow (because of the sedation medicines used during the test).    FOLLOW UP: Our staff will call the number listed on your records 48-72 hours following your procedure to check on you and address any questions or concerns that you may have regarding the information given to you following your procedure. If we do not reach you, we will leave a message.  We will attempt to reach you two times.  During this call, we will ask if you have developed any symptoms of COVID 19. If you develop any symptoms (ie: fever, flu-like symptoms, shortness of breath, cough etc.) before then, please call [phone].  If you test positive for Covid 19 in the 2 weeks post procedure, please call and report this information to [place].    If any biopsies were taken you will be contacted by phone or by letter within the next 1-3 weeks.  Please call us at [phone] if you have not heard about the  biopsies in 3 weeks.    SIGNATURES/CONFIDENTIALITY: You and/or your care partner have signed paperwork which will be entered into your electronic medical record.  These signatures attest to the fact that that the information above on your After Visit Summary has been reviewed and is understood.  Full responsibility of the confidentiality of this discharge information lies with you and/or your care-partner.

## 2020-06-28 NOTE — Progress Notes (Signed)
1445 Robinul 0.1 mg IV given due large amount of secretions upon assessment.  MD made aware, vss  

## 2020-06-28 NOTE — Op Note (Signed)
[facility] Patient Name: William [name] Procedure Date: 06/28/2020 2:43 PM MRN: [id number] Endoscopist: Jerene Bears , MD Age: 54 Referring MD:  Date of Birth: [birth year] Gender: Male Account #: [id number] Procedure:                Upper GI endoscopy Indications:              Abdominal pain in the left lower quadrant, Early                            satiety, change in bowel habits Medicines:                Monitored Anesthesia Care Procedure:                Pre-Anesthesia Assessment:                           - Prior to the procedure, a History and Physical                            was performed, and patient medications and                            allergies were reviewed. The patient's tolerance of                            previous anesthesia was also reviewed. The risks                            and benefits of the procedure and the sedation                            options and risks were discussed with the patient.                            All questions were answered, and informed consent                            was obtained. Prior Anticoagulants: The patient has                            taken no previous anticoagulant or antiplatelet                            agents. ASA Grade Assessment: II - A patient with                            mild systemic disease. After reviewing the risks                            and benefits, the patient was deemed in                            satisfactory condition to undergo the procedure.  After obtaining informed consent, the endoscope was                            passed under direct vision. Throughout the                            procedure, the patient's blood pressure, pulse, and                            oxygen saturations were monitored continuously. The                            Endoscope was introduced through the mouth, and                            advanced to the second part of  duodenum. The upper                            GI endoscopy was accomplished without difficulty.                            The patient tolerated the procedure well. Scope In: Scope Out: Findings:                 The examined esophagus was normal. Z-line is                            regular at 45 cm.                           Mild inflammation characterized by erythema and                            thickened gastric body folds was found in the                            gastric body and in the gastric antrum. Biopsies                            were taken with a cold forceps for histology and                            Helicobacter pylori testing.                           The examined duodenum was normal. Complications:            No immediate complications. Estimated Blood Loss:     Estimated blood loss was minimal. Impression:               - Normal esophagus.                           - Mild gastritis. Biopsied.                           -  Normal examined duodenum. Recommendation:           - Patient has a contact number available for                            emergencies. The signs and symptoms of potential                            delayed complications were discussed with the                            patient. Return to normal activities tomorrow.                            Written discharge instructions were provided to the                            patient.                           - Resume previous diet.                           - Continue present medications.                           - Await pathology results.                           - See the other procedure note for documentation of                            additional recommendations. Jerene Bears, MD 06/28/2020 3:21:13 PM This report has been signed electronically.

## 2020-06-30 ENCOUNTER — Telehealth: Payer: Self-pay

## 2020-06-30 NOTE — Telephone Encounter (Signed)
  Follow up Call-  Call back number 06/28/2020  Post procedure Call Back phone  # [phone]  Permission to leave phone message Yes  Some recent data might be hidden     Patient questions:  Do you have a fever, pain , or abdominal swelling? No. Pain Score  0 *  Have you tolerated food without any problems? Yes.    Have you been able to return to your normal activities? Yes.    Do you have any questions about your discharge instructions: Diet   No. Medications  No. Follow up visit  No.  Do you have questions or concerns about your Care? No.  Actions: * If pain score is 4 or above: No action needed, pain <4. 1. Have you developed a fever since your procedure? no  2.   Have you had an respiratory symptoms (SOB or cough) since your procedure? no  3.   Have you tested positive for COVID 19 since your procedure no  4.   Have you had any family members/close contacts diagnosed with the COVID 19 since your procedure?  no   If yes to any of these questions please route to Joylene John, RN and Joella Prince, RN

## 2020-07-05 ENCOUNTER — Encounter: Payer: Self-pay | Admitting: Internal Medicine

## 2020-07-06 ENCOUNTER — Other Ambulatory Visit: Payer: Self-pay | Admitting: Family Medicine

## 2020-07-06 ENCOUNTER — Other Ambulatory Visit: Payer: Self-pay

## 2020-07-06 ENCOUNTER — Ambulatory Visit
Admission: EM | Admit: 2020-07-06 | Discharge: 2020-07-06 | Disposition: A | Discharge status: Home / Self Care | Payer: BC Managed Care – PPO | Attending: Family Medicine | Admitting: Family Medicine

## 2020-07-06 ENCOUNTER — Encounter: Payer: Self-pay | Admitting: Emergency Medicine

## 2020-07-06 DIAGNOSIS — R0789 Other chest pain: Secondary | ICD-10-CM | POA: Diagnosis not present

## 2020-07-06 DIAGNOSIS — T148XXA Other injury of unspecified body region, initial encounter: Secondary | ICD-10-CM | POA: Diagnosis not present

## 2020-07-06 NOTE — Discharge Instructions (Signed)
Your EKG was normal today  I do think that you have pulled a muscle  Have you take ibuprofen as needed  Cannot rule out pulmonary embolism in this office  Follow-up with the ER for any shortness of breath, sweating with little exertion, shortness of breath, other concerning symptoms

## 2020-07-06 NOTE — ED Provider Notes (Signed)
[place]   [id number] 07/06/20 Arrival Time: 4696   CC: CHEST PAIN  SUBJECTIVE:  William [name] is a 54 y.o. male who presents with complaint of abrupt or gradual chest pain that began yesterday. Reports that he was outside trimming hedges with a pole saw this weekend. Denies a precipitating event, trauma, recent lower respiratory tract. Localizes chest pain to the substernal region. Describes as achy, that is intermittent (with episodes last a few seconds to a few minutes) and nagging in character. Has not attempted OTC treatment.  Symptoms made worse with activity, exertion, and coughing.  Denies radiates symptoms.  Denies previous symptoms in the past.  Denies fever, chills, lightheadedness, dizziness, palpitations, tachycardia, SOB, nausea, vomiting, abdominal pain, changes in bowel or bladder habits, diaphoresis, numbness/tingling in extremities, peripheral edema, or anxiety.    Denies SOB, calf pain or swelling, recent long travel, recent surgery,  malignancy, tobacco use, hormone use, or previous blood clot  Denies close relatives with cardiac hx  Previous cardiac testing: electrocardiogram (ECG) in 02/21 [facility].  ROS: As per HPI.  All other pertinent ROS negative.    Past Medical History:  Diagnosis Date  . Difficulty sleeping    TAKES ADVIL PM  . Diverticulitis of intestine with perforation and abscess   . Diverticulosis   . Dysuria   . ED (erectile dysfunction)   . GERD (gastroesophageal reflux disease)   . Hyperlipidemia   . Hypertension   . Osteoarthritis   . Pancreatitis, acute   . PONV (postoperative nausea and vomiting)   . Prostatitis   . Reflux    Past Surgical History:  Procedure Laterality Date  . ABCESS DRAINAGE     ABDOMINAL  . APPENDECTOMY    . CHOLECYSTECTOMY    . COLONOSCOPY N/A 06/09/2015   Procedure: COLONOSCOPY;  Surgeon: Daneil Dolin, MD;  Location: [facility];  Service: Endoscopy;  Laterality: N/A;  11:30 Am  . HERNIA REPAIR      . KNEE SURGERY     Both  . LAPAROSCOPIC SIGMOID COLECTOMY N/A 11/17/2013   Procedure: LAPAROSCOPIC SIGMOID COLECTOMY rigid proctoscopy;  Surgeon: Edward Jolly, MD;  Location: [facility];  Service: General;  Laterality: N/A;  . SHOULDER SURGERY Left May 2016  . SPHINCTEROTOMY     for sphincter of Oddi dysfunction  . TONSILLECTOMY     Allergies  Allergen Reactions  . Augmentin [Amoxicillin-Pot Clavulanate] Diarrhea  . Acyclovir And Related   . Biaxin [Clarithromycin]     Headache and vomiting,sores in mouth  . Flomax [Tamsulosin Hcl]     Headache, dizziness  . Other Nausea And Vomiting    anesthesia    No current facility-administered medications on file prior to encounter.   Current Outpatient Medications on File Prior to Encounter  Medication Sig Dispense Refill  . fluticasone (FLONASE) 50 MCG/ACT nasal spray SPRAY 2 SPRAYS INTO EACH NOSTRIL EVERY DAY 48 mL 0  . hyoscyamine (LEVSIN) 0.125 MG tablet Take 1 tablet (0.125 mg total) by mouth every 6 (six) hours as needed for cramping. 30 tablet 0  . linaclotide (LINZESS) 145 MCG CAPS capsule Take 1 capsule (145 mcg total) by mouth daily before breakfast. 30 capsule 0  . lisinopril (ZESTRIL) 5 MG tablet Take 1 tablet by mouth once daily 90 tablet 0  . lovastatin (MEVACOR) 40 MG tablet Take 1 tablet (40 mg total) by mouth at bedtime. 90 tablet 1  . Multiple Vitamins-Minerals (ALIVE MENS ENERGY PO) Take 1 tablet by mouth daily.    Marland Kitchen  neomycin-polymyxin-hydrocortisone (CORTISPORIN) 3.5-10000-1 OTIC suspension Place 3 drops in the affected ear four times daily 10 mL 0  . omeprazole (PRILOSEC) 40 MG capsule Take 1 capsule (40 mg total) by mouth daily. 90 capsule 1  . ondansetron (ZOFRAN ODT) 4 MG disintegrating tablet Take 1 tablet (4 mg total) by mouth every 8 (eight) hours as needed for nausea or vomiting. 30 tablet 0  . ondansetron (ZOFRAN ODT) 4 MG disintegrating tablet Take 1 tablet (4 mg total) by mouth every 8 (eight) hours as needed  for nausea or vomiting. 20 tablet 0  . tadalafil (CIALIS) 5 MG tablet Take 5 mg by mouth daily.    Marland Kitchen zolpidem (AMBIEN) 10 MG tablet Take 1 tablet (10 mg total) by mouth at bedtime. 30 tablet 5   Social History   Socioeconomic History  . Marital status: Married    Spouse name: Not on file  . Number of children: Not on file  . Years of education: Not on file  . Highest education level: Not on file  Occupational History  . Not on file  Tobacco Use  . Smoking status: Former Smoker    Quit date: 11/10/1986    Years since quitting: 33.6  . Smokeless tobacco: Never Used  Vaping Use  . Vaping Use: Never used  Substance and Sexual Activity  . Alcohol use: Yes    Alcohol/week: 1.0 standard drink    Types: 1 Glasses of wine per week    Comment: 2-3 glasses a week.   . Drug use: No  . Sexual activity: Yes  Other Topics Concern  . Not on file  Social History Narrative  . Not on file   Social Determinants of Health   Financial Resource Strain:   . Difficulty of Paying Living Expenses: Not on file  Food Insecurity:   . Worried About Charity fundraiser in the Last Year: Not on file  . Ran Out of Food in the Last Year: Not on file  Transportation Needs:   . Lack of Transportation (Medical): Not on file  . Lack of Transportation (Non-Medical): Not on file  Physical Activity:   . Days of Exercise per Week: Not on file  . Minutes of Exercise per Session: Not on file  Stress:   . Feeling of Stress : Not on file  Social Connections:   . Frequency of Communication with Friends and Family: Not on file  . Frequency of Social Gatherings with Friends and Family: Not on file  . Attends Religious Services: Not on file  . Active Member of Clubs or Organizations: Not on file  . Attends Archivist Meetings: Not on file  . Marital Status: Not on file  Intimate Partner Violence:   . Fear of Current or Ex-Partner: Not on file  . Emotionally Abused: Not on file  . Physically Abused:  Not on file  . Sexually Abused: Not on file   Family History  Problem Relation Age of Onset  . Cancer Father        ? Colon mass removed   . Ulcerative colitis Sister   . Colon cancer Maternal Grandmother        42s  . Esophageal cancer Neg Hx   . Pancreatic cancer Neg Hx   . Stomach cancer Neg Hx      OBJECTIVE:  Vitals:   07/06/20 1144 07/06/20 1145  BP:  133/84  Pulse:  83  Resp:  18  Temp:  98.5 F (36.9 C)  TempSrc:  Oral  SpO2:  98%  Weight: 212 lb (96.2 kg)   Height: 6' 1"  (1.854 m)     General appearance: alert; no distress Eyes: PERRLA; EOMI; conjunctiva normal HENT: normocephalic; atraumatic Neck: supple Lungs: clear to auscultation bilaterally without adventitious breath sounds Heart: regular rate and rhythm.  Clear S1 and S2 without rubs, gallops, or murmur. Chest Wall: nontender, no thrills Abdomen: soft, non-tender; bowel sounds normal; no masses or organomegaly; no guarding or rebound tenderness Extremities: no cyanosis or edema; symmetrical with no gross deformities Skin: warm and dry Psychological: alert and cooperative; normal mood and affect  ECG: Orders placed or performed during the hospital encounter of 07/06/20  . ED EKG  . ED EKG    EKG normal sinus rhythm without ST elevations, depressions, or prolonged PR interval.  No narrowing or widening of the QRS complexes.     LABS:  Results for orders placed or performed in visit on 05/11/20  Comp Met (CMET)  Result Value Ref Range   Sodium 136 135 - 145 mEq/L   Potassium 3.6 3.5 - 5.1 mEq/L   Chloride 100 96 - 112 mEq/L   CO2 28 19 - 32 mEq/L   Glucose, Bld 95 70 - 99 mg/dL   BUN 17 6 - 23 mg/dL   Creatinine, Ser 0.85 0.40 - 1.50 mg/dL   Total Bilirubin 0.8 0.2 - 1.2 mg/dL   Alkaline Phosphatase 59 39 - 117 U/L   AST 20 0 - 37 U/L   ALT 25 0 - 53 U/L   Total Protein 7.7 6.0 - 8.3 g/dL   Albumin 4.9 3.5 - 5.2 g/dL   GFR 93.11 >60.00 mL/min   Calcium 9.7 8.4 - 10.5 mg/dL  CBC with  Differential/Platelet  Result Value Ref Range   WBC 7.1 4.0 - 10.5 K/uL   RBC 4.83 4.22 - 5.81 Mil/uL   Hemoglobin 14.6 13.0 - 17.0 g/dL   HCT 42.7 39 - 52 %   MCV 88.5 78.0 - 100.0 fl   MCHC 34.2 30.0 - 36.0 g/dL   RDW 14.3 11.5 - 15.5 %   Platelets 223.0 150 - 400 K/uL   Neutrophils Relative % 65.1 43 - 77 %   Lymphocytes Relative 25.8 12 - 46 %   Monocytes Relative 6.9 3 - 12 %   Eosinophils Relative 1.7 0 - 5 %   Basophils Relative 0.5 0 - 3 %   Neutro Abs 4.6 1.4 - 7.7 K/uL   Lymphs Abs 1.8 0.7 - 4.0 K/uL   Monocytes Absolute 0.5 0 - 1 K/uL   Eosinophils Absolute 0.1 0 - 0 K/uL   Basophils Absolute 0.0 0 - 0 K/uL   Labs Reviewed - No data to display  DIAGNOSTIC STUDIES:  No results found.   ASSESSMENT & PLAN:  1. Chest pain, unspecified type     No orders of the defined types were placed in this encounter.   Patient history and exam consistent with non-cardiac cause of chest pain. Conservative measures indicated. OTC analgesics as needed. Worsening signs and symptoms discussed and patient verbalized understanding. Discussed with patient that we cannot rule out PE in the office, if any symptoms acutely worsen report directly to the ED. Chest pain precautions given. Reviewed expectations about course of current medical issues. Questions answered. Outlined signs and symptoms indicating need for more acute intervention. Patient verbalized understanding. After Visit Summary given.    Faustino Congress, NP 07/06/20 1208

## 2020-07-06 NOTE — Telephone Encounter (Signed)
Would call the GI doctor for more refills of this, just seen by GI recently.   Thx.   Dr. Lovena Le

## 2020-07-06 NOTE — ED Triage Notes (Signed)
Chest soreness with movement and coughing.  Pt states he was trimming some hedges with pole saw this weekend.

## 2020-07-12 MED ORDER — OMEPRAZOLE 40 MG PO CPDR: 40.0000 mg | DELAYED_RELEASE_CAPSULE | Freq: Every day | ORAL | 1 refills | Status: AC

## 2020-07-13 MED ORDER — HYOSCYAMINE SULFATE 0.125 MG PO TABS: 0.1250 mg | ORAL_TABLET | Freq: Four times a day (QID) | ORAL | 0 refills | Status: DC | PRN

## 2020-07-22 ENCOUNTER — Ambulatory Visit: Payer: Self-pay | Admitting: Surgery

## 2020-07-22 DIAGNOSIS — R1032 Left lower quadrant pain: Secondary | ICD-10-CM | POA: Diagnosis not present

## 2020-07-22 DIAGNOSIS — Z9049 Acquired absence of other specified parts of digestive tract: Secondary | ICD-10-CM | POA: Diagnosis not present

## 2020-07-26 MED ORDER — ZOLPIDEM TARTRATE 10 MG PO TABS
10.0000 mg | ORAL_TABLET | Freq: Every day | ORAL | 2 refills | Status: DC
Start: 2020-07-26 — End: 2021-06-01

## 2020-07-27 ENCOUNTER — Telehealth: Payer: Self-pay

## 2020-07-27 NOTE — Telephone Encounter (Signed)
Patient calling checking on rx request for ambien.  Sent a Comptroller yesterday.

## 2020-07-27 NOTE — Telephone Encounter (Signed)
Prescription has been received and filled by pharmacy. Patient notified.

## 2020-07-29 ENCOUNTER — Other Ambulatory Visit: Payer: Self-pay | Admitting: Family Medicine

## 2020-07-29 ENCOUNTER — Telehealth: Payer: Self-pay | Admitting: Family Medicine

## 2020-07-29 MED ORDER — LOVASTATIN 40 MG PO TABS: 40.0000 mg | ORAL_TABLET | Freq: Every day | ORAL | 0 refills | Status: AC

## 2020-07-29 NOTE — Addendum Note (Signed)
Addended by: Erven Colla on: 07/29/2020 01:10 PM   Modules accepted: Orders

## 2020-07-29 NOTE — Telephone Encounter (Signed)
[facility] requesting refill on Lovastatin 40 mg tablet. Pt last seen 04/14/20 for HTN. Please advise. Thank you

## 2020-07-30 DIAGNOSIS — Z23 Encounter for immunization: Secondary | ICD-10-CM | POA: Diagnosis not present

## 2020-08-04 DIAGNOSIS — M47816 Spondylosis without myelopathy or radiculopathy, lumbar region: Secondary | ICD-10-CM | POA: Diagnosis not present

## 2020-08-04 DIAGNOSIS — M545 Low back pain, unspecified: Secondary | ICD-10-CM | POA: Diagnosis not present

## 2020-08-05 ENCOUNTER — Telehealth: Payer: Self-pay | Admitting: Family Medicine

## 2020-08-05 NOTE — Telephone Encounter (Signed)
[place] requesting refill on Fluticasone 50 mcg spray. Pt last seen 04/14/20 for HTN. Please advise. Thank you

## 2020-08-06 DIAGNOSIS — M47816 Spondylosis without myelopathy or radiculopathy, lumbar region: Secondary | ICD-10-CM | POA: Diagnosis not present

## 2020-08-06 MED ORDER — FLUTICASONE PROPIONATE 50 MCG/ACT NA SUSP
2.0000 | Freq: Every day | NASAL | 1 refills | Status: DC
Start: 2020-08-06 — End: 2021-01-25

## 2020-08-06 NOTE — Addendum Note (Signed)
Addended by: Erven Colla on: 08/06/2020 08:49 AM   Modules accepted: Orders

## 2020-08-10 MED ORDER — AMLODIPINE BESYLATE 10 MG PO TABS: 10.0000 mg | ORAL_TABLET | Freq: Every day | ORAL | 1 refills | Status: AC

## 2020-08-10 NOTE — Progress Notes (Signed)
COVID Vaccine Completed: x2 Date COVID Vaccine completed:  03-05-20 & 03-26-20 COVID vaccine manufacturer: [place]   PCP - Elvia Collum, DO Cardiologist -   Chest x-ray - 11-05-19 in Epic EKG - 07-06-20 in Epic (Seen in ER for chest wall pain) Stress Test -  ECHO -  Cardiac Cath -  Pacemaker/ICD device last checked:  Sleep Study -  CPAP -   Fasting Blood Sugar -  Checks Blood Sugar _____ times a day  Blood Thinner Instructions: Aspirin Instructions: Last Dose:  Anesthesia review:   Patient denies shortness of breath, fever, cough and chest pain at PAT appointment   Patient verbalized understanding of instructions that were given to them at the PAT appointment. Patient was also instructed that they will need to review over the PAT instructions again at home before surgery.

## 2020-08-10 NOTE — Telephone Encounter (Signed)
sent 

## 2020-08-10 NOTE — Patient Instructions (Signed)
DUE TO COVID-19 ONLY ONE VISITOR IS ALLOWED TO COME WITH YOU AND STAY IN THE WAITING ROOM ONLY DURING PRE OP AND PROCEDURE.   IF YOU WILL BE ADMITTED INTO THE HOSPITAL YOU ARE ALLOWED ONE SUPPORT PERSON DURING VISITATION HOURS ONLY (10AM -8PM)   . The support person may change daily. . The support person must pass our screening, gel in and out, and wear a mask at all times, including in the patient's room. . Patients must also wear a mask when staff or their support person are in the room.   COVID SWAB TESTING MUST BE COMPLETED ON:  Monday, 08-16-20 @    [address] [place], [place] [address]  (Must self quarantine after testing. Follow instructions on handout.)        Your procedure is scheduled on:  Thursday, 08-19-20   Report to [facility] Main  Entrance   Report to admitting at 6:30 AM   Call this number if you have problems the morning of surgery [phone]   Do not eat food :After Midnight.   May have liquids until 5:30 AM day of surgery  CLEAR LIQUID DIET  Foods Allowed                                                                     Foods Excluded  Water, Black Coffee and tea, regular and decaf            liquids that you cannot  Plain Jell-O in any flavor  (No red)                                   see through such as: Fruit ices (not with fruit pulp)                                      milk, soups, orange juice              Iced Popsicles (No red)                                      All solid food                                   Apple juices Sports drinks like Gatorade (No red) Lightly seasoned clear broth or consume(fat free) Sugar, honey syrup   Oral Hygiene is also important to reduce your risk of infection.                                    Remember - BRUSH YOUR TEETH THE MORNING OF SURGERY WITH YOUR REGULAR TOOTHPASTE   Do NOT smoke after Midnight   Take these medicines the morning of surgery with A SIP OF WATER:   Amlodipine,  Omeprazole  You may not have any metal on your body including jewelry, and body piercings             Do not wear  lotions, powders, perfumes/cologne, or deodorant              Men may shave face and neck.   Do not bring valuables to the hospital. [place].   Contacts, dentures or bridgework may not be worn into surgery.   Bring small overnight bag day of surgery.                Please read over the following fact sheets you were given: IF YOU HAVE QUESTIONS ABOUT YOUR PRE OP INSTRUCTIONS PLEASE CALL [phone]   [facility] - Preparing for Surgery Before surgery, you can play an important role.  Because skin is not sterile, your skin needs to be as free of germs as possible.  You can reduce the number of germs on your skin by washing with CHG (chlorahexidine gluconate) soap before surgery.  CHG is an antiseptic cleaner which kills germs and bonds with the skin to continue killing germs even after washing. Please DO NOT use if you have an allergy to CHG or antibacterial soaps.  If your skin becomes reddened/irritated stop using the CHG and inform your nurse when you arrive at Short Stay. Do not shave (including legs and underarms) for at least 48 hours prior to the first CHG shower.  You may shave your face/neck.  Please follow these instructions carefully:  1.  Shower with CHG Soap the night before surgery and the  morning of surgery.  2.  If you choose to wash your hair, wash your hair first as usual with your normal  shampoo.  3.  After you shampoo, rinse your hair and body thoroughly to remove the shampoo.                             4.  Use CHG as you would any other liquid soap.  You can apply chg directly to the skin and wash.  Gently with a scrungie or clean washcloth.  5.  Apply the CHG Soap to your body ONLY FROM THE NECK DOWN.   Do   not use on face/ open                           Wound or open sores. Avoid  contact with eyes, ears mouth and   genitals (private parts).                       Wash face,  Genitals (private parts) with your normal soap.             6.  Wash thoroughly, paying special attention to the area where your    surgery  will be performed.  7.  Thoroughly rinse your body with warm water from the neck down.  8.  DO NOT shower/wash with your normal soap after using and rinsing off the CHG Soap.                9.  Pat yourself dry with a clean towel.            10.  Wear clean pajamas.            11.  Place clean sheets  on your bed the night of your first shower and do not  sleep with pets. Day of Surgery : Do not apply any lotions/deodorants the morning of surgery.  Please wear clean clothes to the hospital/surgery center.  FAILURE TO FOLLOW THESE INSTRUCTIONS MAY RESULT IN THE CANCELLATION OF YOUR SURGERY  PATIENT SIGNATURE_________________________________  NURSE SIGNATURE__________________________________  ________________________________________________________________________

## 2020-08-11 ENCOUNTER — Other Ambulatory Visit: Payer: Self-pay | Admitting: Urology

## 2020-08-13 ENCOUNTER — Encounter (HOSPITAL_COMMUNITY): Payer: Self-pay

## 2020-08-13 ENCOUNTER — Other Ambulatory Visit: Payer: Self-pay

## 2020-08-13 ENCOUNTER — Encounter (HOSPITAL_COMMUNITY)
Admission: RE | Admit: 2020-08-13 | Discharge: 2020-08-13 | Disposition: A | Discharge status: Home / Self Care | Payer: BC Managed Care – PPO | Source: Ambulatory Visit | Attending: Surgery | Admitting: Surgery

## 2020-08-13 DIAGNOSIS — Z01812 Encounter for preprocedural laboratory examination: Secondary | ICD-10-CM | POA: Diagnosis not present

## 2020-08-13 HISTORY — DX: COVID-19: U07.1

## 2020-08-13 HISTORY — DX: Family history of other specified conditions: Z84.89

## 2020-08-13 HISTORY — DX: Headache, unspecified: R51.9

## 2020-08-13 LAB — CBC
HCT: 43 % (ref 39.0–52.0)
Hemoglobin: 14.6 g/dL (ref 13.0–17.0)
MCH: 30.3 pg (ref 26.0–34.0)
MCHC: 34 g/dL (ref 30.0–36.0)
MCV: 89.2 fL (ref 80.0–100.0)
Platelets: 252 10*3/uL (ref 150–400)
RBC: 4.82 MIL/uL (ref 4.22–5.81)
RDW: 13.1 % (ref 11.5–15.5)
WBC: 7 10*3/uL (ref 4.0–10.5)
nRBC: 0 % (ref 0.0–0.2)

## 2020-08-13 LAB — BASIC METABOLIC PANEL
Anion gap: 12 (ref 5–15)
BUN: 22 mg/dL — ABNORMAL HIGH (ref 6–20)
CO2: 23 mmol/L (ref 22–32)
Calcium: 9.1 mg/dL (ref 8.9–10.3)
Chloride: 102 mmol/L (ref 98–111)
Creatinine, Ser: 0.72 mg/dL (ref 0.61–1.24)
GFR, Estimated: >60 mL/min (ref >60)
Glucose, Bld: 129 mg/dL — ABNORMAL HIGH (ref 70–99)
Potassium: 3.5 mmol/L (ref 3.5–5.1)
Sodium: 137 mmol/L (ref 135–145)

## 2020-08-15 ENCOUNTER — Encounter (HOSPITAL_COMMUNITY): Payer: Self-pay | Admitting: Surgery

## 2020-08-15 DIAGNOSIS — R1032 Left lower quadrant pain: Secondary | ICD-10-CM | POA: Diagnosis present

## 2020-08-15 NOTE — H&P (Signed)
General Surgery [facility]  William [name] DOB: [birth year] Divorced / Language: William [name] / Race: White Male   History of Present Illness   The patient is a 54 year old male who presents with non-malignant abdominal pain.  CHIEF COMPLAINT: LLQ abdominal pain, swelling  Patient returns for surgical evaluation and management of left lower quadrant abdominal pain. Patient underwent partial colectomy by Dr. Adonis Housekeeper in 2015 for diverticular disease. Patient has developed left lower quadrant intermittent abdominal pain with a visible bulge. This is now persistent over many months. It improved some with the addition of daily MiraLAX. Patient has also been seen and evaluated by his gastroenterologist, Dr. Zenovia Jarred. He underwent colonoscopy. I have discussed this with his gastroenterologist and there were no significant findings to explain the patient's symptoms. Patient returns today to discuss surgical intervention.   Problem List/Past Medical  INGUINAL HERNIA OF RIGHT SIDE WITHOUT OBSTRUCTION OR GANGRENE (K40.90)  LEFT LOWER QUADRANT ABDOMINAL PAIN (R10.32)  HISTORY OF PARTIAL COLECTOMY (Z90.49)   Past Surgical History  Appendectomy  Colon Removal - Partial  Gallbladder Surgery - Laparoscopic  Knee Surgery  Bilateral. Open Inguinal Hernia Surgery  Left. Resection of Small Bowel  Shoulder Surgery  Bilateral. Tonsillectomy  Vasectomy   Diagnostic Studies History  Colonoscopy  1-5 years ago  Allergies  Sulfa Antibiotics  Allergies Reconciled   Medication History  Tadalafil (5MG  Tablet, Oral) Active. Omeprazole (40MG  Capsule DR, Oral) Active. Lisinopril (5MG  Tablet, Oral) Active. Lovastatin (40MG  Tablet, Oral) Active. Zolpidem Tartrate (10MG  Tablet, Oral) Active. amLODIPine Besylate (5MG  Tablet, Oral) Active. Medications Reconciled  Social History  Alcohol use  Occasional alcohol use. Caffeine use  Coffee. No  drug use  Tobacco use  Former smoker.  Family History  Arthritis  Father, Mother. Colon Cancer  Father, Sister. Colon Polyps  Father, Sister. Hypertension  Mother. Ischemic Bowel Disease  Sister.  Other Problems  Arthritis  Back Pain  Enlarged Prostate  Gastroesophageal Reflux Disease  General anesthesia - complications  High blood pressure  Hypercholesterolemia  Inguinal Hernia   Vitals  Weight: 222.4 lb Height: 73in Body Surface Area: 2.25 m Body Mass Index: 29.34 kg/m  Temp.: 98.58F (Infrared)  Pulse: 88 (Regular)  BP: 132/74(Sitting, Left Arm, Standard)  Physical Exam   Limited examination  Abdomen is soft without distention. Well-healed right lower quadrant paramedian surgical incision. Also a well-healed incision in the left lower quadrant laterally. Both incisions are well healed without evidence of herniation. Abdomen is soft without palpable mass. There is mild tenderness to palpation in the left lower quadrant.    Assessment & Plan   LEFT LOWER QUADRANT ABDOMINAL PAIN (R10.32) HISTORY OF PARTIAL COLECTOMY (Z90.49)  The patient returns to my practice after evaluation by his gastroenterologist. He continues to have persistent symptoms of discomfort and swelling in the left lower quadrant of the abdomen.  We discussed his symptoms. We discussed the possibility that this was related to adhesions from his prior surgical procedures and that he might benefit from laparoscopic lysis of adhesions. My plan would be to prepare the patient for surgery with a bowel prep. We would then perform diagnostic laparoscopy to evaluate the left lower quadrant of the abdomen. He will likely require some lysis of adhesions. I would like to have one of my colorectal colleagues available at the time of surgery to help with his evaluation. Hopefully there will be no significant abnormal findings other than adhesions. By having a bowel preparation, we will  be prepared to deal with whatever we find at the time of his procedure. I would schedule him for an overnight stay following diagnostic laparoscopy. Patient understands this plan and agrees to proceed.  Currently our operating room schedule is limited due to the Covid Pandemic. We will place orders today with our scheduling department and move forward with taking a date for surgery once our availability resumes.  The risks and benefits of the procedure have been discussed at length with the patient. The patient understands the proposed procedure, potential alternative treatments, and the course of recovery to be expected. All of the patient's questions have been answered at this time. The patient wishes to proceed with surgery.  Armandina Gemma, MD [facility] Office: [phone]

## 2020-08-16 ENCOUNTER — Other Ambulatory Visit (HOSPITAL_COMMUNITY)
Admission: RE | Admit: 2020-08-16 | Discharge: 2020-08-16 | Disposition: A | Discharge status: Home / Self Care | Payer: BC Managed Care – PPO | Source: Ambulatory Visit | Attending: Surgery | Admitting: Surgery

## 2020-08-16 DIAGNOSIS — Z20822 Contact with and (suspected) exposure to covid-19: Secondary | ICD-10-CM | POA: Diagnosis not present

## 2020-08-16 DIAGNOSIS — Z01812 Encounter for preprocedural laboratory examination: Secondary | ICD-10-CM | POA: Insufficient documentation

## 2020-08-17 LAB — SARS CORONAVIRUS 2 (TAT 6-24 HRS): SARS Coronavirus 2: NEGATIVE

## 2020-08-18 ENCOUNTER — Encounter (HOSPITAL_COMMUNITY): Payer: Self-pay | Admitting: Surgery

## 2020-08-18 MED ORDER — SODIUM CHLORIDE 0.9 % IV SOLN
1.0000 g | INTRAVENOUS | Status: AC
  Administered 2020-08-19: 1 g via INTRAVENOUS
  Filled 2020-08-18: qty 1

## 2020-08-18 NOTE — Progress Notes (Signed)
Pt called. He had information from Dr. Gala Lewandowsky office indicating that he was to have 3 Ensure supplemental  loading drinks that would be dispensed at his PAT appt. However, he was not give any at all.   In reviewing the orders. It appears that order was only placed for 1 Ensure supplement. Advised patient that I would contact Dr. Gala Lewandowsky office for clarification.   Spoke to [employer]. Per Claiborne Billings, pt only requires 1 Ensure supplement. Pt advised, and will send wife to pick up supplement.

## 2020-08-18 NOTE — Anesthesia Preprocedure Evaluation (Addendum)
Anesthesia Evaluation  Patient identified by MRN, date of birth, ID band Patient awake    Reviewed: Allergy & Precautions, NPO status , Patient's Chart, lab work & pertinent test results  History of Anesthesia Complications (+) PONV and history of anesthetic complications  Airway Mallampati: II  TM Distance: >3 FB Neck ROM: Full    Dental  (+) Teeth Intact, Dental Advisory Given   Pulmonary former smoker,    breath sounds clear to auscultation       Cardiovascular hypertension,  Rhythm:Regular Rate:Normal     Neuro/Psych  Headaches, PSYCHIATRIC DISORDERS Anxiety Depression    GI/Hepatic Neg liver ROS, GERD  ,  Endo/Other  negative endocrine ROS  Renal/GU negative Renal ROS     Musculoskeletal  (+) Arthritis ,   Abdominal Normal abdominal exam  (+)   Peds  Hematology negative hematology ROS (+)   Anesthesia Other Findings   Reproductive/Obstetrics                            Anesthesia Physical Anesthesia Plan  ASA: II  Anesthesia Plan: General   Post-op Pain Management:    Induction: Intravenous  PONV Risk Score and Plan: 4 or greater and Ondansetron, Dexamethasone, Midazolam and Scopolamine patch - Pre-op  Airway Management Planned: Oral ETT  Additional Equipment: None  Intra-op Plan:   Post-operative Plan: Extubation in OR  Informed Consent: I have reviewed the patients History and Physical, chart, labs and discussed the procedure including the risks, benefits and alternatives for the proposed anesthesia with the patient or authorized representative who has indicated his/her understanding and acceptance.       Plan Discussed with: CRNA  Anesthesia Plan Comments:        Anesthesia Quick Evaluation

## 2020-08-19 ENCOUNTER — Encounter (HOSPITAL_COMMUNITY)
Admission: RE | Disposition: A | Discharge status: Home / Self Care | Payer: Self-pay | Source: Home / Self Care | Attending: Surgery

## 2020-08-19 ENCOUNTER — Other Ambulatory Visit: Payer: Self-pay

## 2020-08-19 ENCOUNTER — Encounter (HOSPITAL_COMMUNITY): Payer: Self-pay | Admitting: Surgery

## 2020-08-19 ENCOUNTER — Ambulatory Visit (HOSPITAL_COMMUNITY)
Admission: RE | Admit: 2020-08-19 | Discharge: 2020-08-20 | Disposition: A | Discharge status: Home / Self Care | Payer: BC Managed Care – PPO | Attending: Surgery | Admitting: Surgery

## 2020-08-19 ENCOUNTER — Ambulatory Visit (HOSPITAL_COMMUNITY): Payer: BC Managed Care – PPO | Admitting: Anesthesiology

## 2020-08-19 DIAGNOSIS — Z9049 Acquired absence of other specified parts of digestive tract: Secondary | ICD-10-CM | POA: Insufficient documentation

## 2020-08-19 DIAGNOSIS — Z87891 Personal history of nicotine dependence: Secondary | ICD-10-CM | POA: Insufficient documentation

## 2020-08-19 DIAGNOSIS — Z8379 Family history of other diseases of the digestive system: Secondary | ICD-10-CM | POA: Insufficient documentation

## 2020-08-19 DIAGNOSIS — Z882 Allergy status to sulfonamides status: Secondary | ICD-10-CM | POA: Insufficient documentation

## 2020-08-19 DIAGNOSIS — K4091 Unilateral inguinal hernia, without obstruction or gangrene, recurrent: Secondary | ICD-10-CM | POA: Insufficient documentation

## 2020-08-19 DIAGNOSIS — I10 Essential (primary) hypertension: Secondary | ICD-10-CM | POA: Diagnosis not present

## 2020-08-19 DIAGNOSIS — K660 Peritoneal adhesions (postprocedural) (postinfection): Secondary | ICD-10-CM | POA: Diagnosis not present

## 2020-08-19 DIAGNOSIS — R1032 Left lower quadrant pain: Secondary | ICD-10-CM | POA: Diagnosis present

## 2020-08-19 DIAGNOSIS — K219 Gastro-esophageal reflux disease without esophagitis: Secondary | ICD-10-CM | POA: Diagnosis not present

## 2020-08-19 HISTORY — PX: LAPAROSCOPIC LYSIS OF ADHESIONS: SHX5905

## 2020-08-19 HISTORY — PX: LAPAROSCOPY: SHX197

## 2020-08-19 LAB — URINALYSIS, ROUTINE W REFLEX MICROSCOPIC
Bilirubin Urine: NEGATIVE
Glucose, UA: NEGATIVE mg/dL
Hgb urine dipstick: NEGATIVE
Ketones, ur: NEGATIVE mg/dL
Leukocytes,Ua: NEGATIVE
Nitrite: NEGATIVE
Protein, ur: NEGATIVE mg/dL
Specific Gravity, Urine: 1.016 (ref 1.005–1.030)
pH: 6 (ref 5.0–8.0)

## 2020-08-19 SURGERY — LAPAROSCOPY, DIAGNOSTIC
Anesthesia: General | Site: Abdomen

## 2020-08-19 MED ORDER — BUPIVACAINE HCL (PF) 0.5 % IJ SOLN
INTRAMUSCULAR | Status: DC | PRN
  Administered 2020-08-19: 30 mL

## 2020-08-19 MED ORDER — FENTANYL CITRATE (PF) 100 MCG/2ML IJ SOLN
INTRAMUSCULAR | Status: DC | PRN
  Administered 2020-08-19 (×5): 50 ug via INTRAVENOUS

## 2020-08-19 MED ORDER — ONDANSETRON 4 MG PO TBDP: 4.0000 mg | ORAL_TABLET | Freq: Four times a day (QID) | ORAL | Status: DC | PRN

## 2020-08-19 MED ORDER — ONDANSETRON HCL 4 MG/2ML IJ SOLN: 4.0000 mg | Freq: Four times a day (QID) | INTRAMUSCULAR | Status: DC | PRN

## 2020-08-19 MED ORDER — 0.9 % SODIUM CHLORIDE (POUR BTL) OPTIME
TOPICAL | Status: DC | PRN
  Administered 2020-08-19: 1000 mL

## 2020-08-19 MED ORDER — PHENYLEPHRINE 40 MCG/ML (10ML) SYRINGE FOR IV PUSH (FOR BLOOD PRESSURE SUPPORT)
PREFILLED_SYRINGE | INTRAVENOUS | Status: AC
  Filled 2020-08-19: qty 10

## 2020-08-19 MED ORDER — PHENYLEPHRINE HCL (PRESSORS) 10 MG/ML IV SOLN
INTRAVENOUS | Status: AC
  Filled 2020-08-19: qty 1

## 2020-08-19 MED ORDER — FENTANYL CITRATE (PF) 100 MCG/2ML IJ SOLN
INTRAMUSCULAR | Status: AC
  Administered 2020-08-19: 50 ug via INTRAVENOUS
  Filled 2020-08-19: qty 2

## 2020-08-19 MED ORDER — ZOLPIDEM TARTRATE 10 MG PO TABS
10.0000 mg | ORAL_TABLET | Freq: Every day | ORAL | Status: DC
  Administered 2020-08-19: 10 mg via ORAL
  Filled 2020-08-19: qty 1

## 2020-08-19 MED ORDER — KETAMINE HCL 10 MG/ML IJ SOLN
INTRAMUSCULAR | Status: AC
  Filled 2020-08-19: qty 1

## 2020-08-19 MED ORDER — MIDAZOLAM HCL 2 MG/2ML IJ SOLN
INTRAMUSCULAR | Status: AC
  Filled 2020-08-19: qty 2

## 2020-08-19 MED ORDER — MIDAZOLAM HCL 5 MG/5ML IJ SOLN
INTRAMUSCULAR | Status: DC | PRN
  Administered 2020-08-19: 2 mg via INTRAVENOUS

## 2020-08-19 MED ORDER — FENTANYL CITRATE (PF) 250 MCG/5ML IJ SOLN
INTRAMUSCULAR | Status: AC
  Filled 2020-08-19: qty 5

## 2020-08-19 MED ORDER — ORAL CARE MOUTH RINSE
15.0000 mL | Freq: Once | OROMUCOSAL | Status: AC
  Administered 2020-08-19: 15 mL via OROMUCOSAL

## 2020-08-19 MED ORDER — ACETAMINOPHEN 10 MG/ML IV SOLN: 1000.0000 mg | Freq: Once | INTRAVENOUS | Status: DC | PRN

## 2020-08-19 MED ORDER — BUPIVACAINE-EPINEPHRINE (PF) 0.5% -1:200000 IJ SOLN
INTRAMUSCULAR | Status: AC
  Filled 2020-08-19: qty 30

## 2020-08-19 MED ORDER — FENTANYL CITRATE (PF) 100 MCG/2ML IJ SOLN
25.0000 ug | INTRAMUSCULAR | Status: DC | PRN
  Administered 2020-08-19: 50 ug via INTRAVENOUS

## 2020-08-19 MED ORDER — ACETAMINOPHEN 160 MG/5ML PO SOLN: 325.0000 mg | Freq: Once | ORAL | Status: DC | PRN

## 2020-08-19 MED ORDER — LACTATED RINGERS IV SOLN: INTRAVENOUS | Status: DC

## 2020-08-19 MED ORDER — LIDOCAINE 2% (20 MG/ML) 5 ML SYRINGE
INTRAMUSCULAR | Status: DC | PRN
  Administered 2020-08-19: 1.5 mg/kg/h via INTRAVENOUS

## 2020-08-19 MED ORDER — AMISULPRIDE (ANTIEMETIC) 5 MG/2ML IV SOLN: 10.0000 mg | Freq: Once | INTRAVENOUS | Status: DC | PRN

## 2020-08-19 MED ORDER — AMLODIPINE BESYLATE 10 MG PO TABS
10.0000 mg | ORAL_TABLET | Freq: Every day | ORAL | Status: DC
  Administered 2020-08-19: 10 mg via ORAL
  Filled 2020-08-19: qty 1

## 2020-08-19 MED ORDER — PHENYLEPHRINE 40 MCG/ML (10ML) SYRINGE FOR IV PUSH (FOR BLOOD PRESSURE SUPPORT)
PREFILLED_SYRINGE | INTRAVENOUS | Status: DC | PRN
  Administered 2020-08-19: 40 ug via INTRAVENOUS
  Administered 2020-08-19 (×2): 80 ug via INTRAVENOUS
  Administered 2020-08-19: 40 ug via INTRAVENOUS
  Administered 2020-08-19 (×2): 80 ug via INTRAVENOUS

## 2020-08-19 MED ORDER — DEXAMETHASONE SODIUM PHOSPHATE 10 MG/ML IJ SOLN
INTRAMUSCULAR | Status: DC | PRN
  Administered 2020-08-19: 8 mg via INTRAVENOUS

## 2020-08-19 MED ORDER — SUGAMMADEX SODIUM 200 MG/2ML IV SOLN
INTRAVENOUS | Status: DC | PRN
  Administered 2020-08-19: 200 mg via INTRAVENOUS

## 2020-08-19 MED ORDER — ROCURONIUM BROMIDE 10 MG/ML (PF) SYRINGE
PREFILLED_SYRINGE | INTRAVENOUS | Status: DC | PRN
  Administered 2020-08-19: 10 mg via INTRAVENOUS
  Administered 2020-08-19: 60 mg via INTRAVENOUS
  Administered 2020-08-19: 10 mg via INTRAVENOUS

## 2020-08-19 MED ORDER — BUPIVACAINE HCL (PF) 0.5 % IJ SOLN
INTRAMUSCULAR | Status: AC
  Filled 2020-08-19: qty 30

## 2020-08-19 MED ORDER — KETAMINE HCL 10 MG/ML IJ SOLN
INTRAMUSCULAR | Status: DC | PRN
  Administered 2020-08-19: 40 mg via INTRAVENOUS

## 2020-08-19 MED ORDER — ACETAMINOPHEN 325 MG PO TABS: 650.0000 mg | ORAL_TABLET | Freq: Four times a day (QID) | ORAL | Status: DC | PRN

## 2020-08-19 MED ORDER — SODIUM CHLORIDE 0.45 % IV SOLN: INTRAVENOUS | Status: DC

## 2020-08-19 MED ORDER — SCOPOLAMINE 1 MG/3DAYS TD PT72
MEDICATED_PATCH | TRANSDERMAL | Status: AC
  Filled 2020-08-19: qty 1

## 2020-08-19 MED ORDER — OXYCODONE HCL 5 MG PO TABS
5.0000 mg | ORAL_TABLET | ORAL | Status: DC | PRN
  Administered 2020-08-19 – 2020-08-20 (×2): 10 mg via ORAL
  Filled 2020-08-19 (×2): qty 2

## 2020-08-19 MED ORDER — MEPERIDINE HCL 50 MG/ML IJ SOLN: 6.2500 mg | INTRAMUSCULAR | Status: DC | PRN

## 2020-08-19 MED ORDER — EPHEDRINE SULFATE-NACL 50-0.9 MG/10ML-% IV SOSY
PREFILLED_SYRINGE | INTRAVENOUS | Status: DC | PRN
  Administered 2020-08-19 (×2): 10 mg via INTRAVENOUS

## 2020-08-19 MED ORDER — TRAMADOL HCL 50 MG PO TABS: 50.0000 mg | ORAL_TABLET | Freq: Four times a day (QID) | ORAL | Status: DC | PRN

## 2020-08-19 MED ORDER — LIDOCAINE HCL 2 % IJ SOLN
INTRAMUSCULAR | Status: AC
  Filled 2020-08-19: qty 20

## 2020-08-19 MED ORDER — PROPOFOL 10 MG/ML IV BOLUS
INTRAVENOUS | Status: DC | PRN
  Administered 2020-08-19: 140 mg via INTRAVENOUS

## 2020-08-19 MED ORDER — ACETAMINOPHEN 325 MG PO TABS: 325.0000 mg | ORAL_TABLET | Freq: Once | ORAL | Status: DC | PRN

## 2020-08-19 MED ORDER — CHLORHEXIDINE GLUCONATE CLOTH 2 % EX PADS: 6.0000 | MEDICATED_PAD | Freq: Once | CUTANEOUS | Status: DC

## 2020-08-19 MED ORDER — LIDOCAINE 2% (20 MG/ML) 5 ML SYRINGE
INTRAMUSCULAR | Status: DC | PRN
  Administered 2020-08-19: 80 mg via INTRAVENOUS

## 2020-08-19 MED ORDER — ACETAMINOPHEN 10 MG/ML IV SOLN
INTRAVENOUS | Status: AC
  Administered 2020-08-19: 1000 mg via INTRAVENOUS
  Filled 2020-08-19: qty 100

## 2020-08-19 MED ORDER — ACETAMINOPHEN 650 MG RE SUPP: 650.0000 mg | Freq: Four times a day (QID) | RECTAL | Status: DC | PRN

## 2020-08-19 MED ORDER — ROCURONIUM BROMIDE 10 MG/ML (PF) SYRINGE
PREFILLED_SYRINGE | INTRAVENOUS | Status: AC
  Filled 2020-08-19: qty 10

## 2020-08-19 MED ORDER — HYDROMORPHONE HCL 1 MG/ML IJ SOLN
1.0000 mg | INTRAMUSCULAR | Status: DC | PRN
  Administered 2020-08-19 – 2020-08-20 (×4): 1 mg via INTRAVENOUS
  Filled 2020-08-19 (×4): qty 1

## 2020-08-19 MED ORDER — EPHEDRINE 5 MG/ML INJ
INTRAVENOUS | Status: AC
  Filled 2020-08-19: qty 10

## 2020-08-19 MED ORDER — ONDANSETRON HCL 4 MG/2ML IJ SOLN
INTRAMUSCULAR | Status: AC
  Filled 2020-08-19: qty 2

## 2020-08-19 MED ORDER — ONDANSETRON HCL 4 MG/2ML IJ SOLN
INTRAMUSCULAR | Status: DC | PRN
  Administered 2020-08-19: 4 mg via INTRAVENOUS

## 2020-08-19 MED ORDER — LISINOPRIL 5 MG PO TABS
5.0000 mg | ORAL_TABLET | Freq: Every day | ORAL | Status: DC
  Administered 2020-08-20: 5 mg via ORAL
  Filled 2020-08-19: qty 1

## 2020-08-19 MED ORDER — PROPOFOL 10 MG/ML IV BOLUS
INTRAVENOUS | Status: AC
  Filled 2020-08-19: qty 20

## 2020-08-19 MED ORDER — PANTOPRAZOLE SODIUM 40 MG PO TBEC
40.0000 mg | DELAYED_RELEASE_TABLET | Freq: Every day | ORAL | Status: DC
  Administered 2020-08-20: 40 mg via ORAL
  Filled 2020-08-19: qty 1

## 2020-08-19 MED ORDER — CHLORHEXIDINE GLUCONATE 0.12 % MT SOLN: 15.0000 mL | Freq: Once | OROMUCOSAL | Status: AC

## 2020-08-19 MED ORDER — LIDOCAINE 2% (20 MG/ML) 5 ML SYRINGE
INTRAMUSCULAR | Status: AC
  Filled 2020-08-19: qty 5

## 2020-08-19 SURGICAL SUPPLY — 36 items
ADH SKN CLS APL DERMABOND .7 (GAUZE/BANDAGES/DRESSINGS) ×1
APL PRP STRL LF DISP 70% ISPRP (MISCELLANEOUS) ×1
CHLORAPREP W/TINT 26 (MISCELLANEOUS) ×2 IMPLANT
COVER SURGICAL LIGHT HANDLE (MISCELLANEOUS) ×2 IMPLANT
COVER WAND RF STERILE (DRAPES) IMPLANT
DECANTER SPIKE VIAL GLASS SM (MISCELLANEOUS) IMPLANT
DERMABOND ADVANCED (GAUZE/BANDAGES/DRESSINGS) ×1
DERMABOND ADVANCED .7 DNX12 (GAUZE/BANDAGES/DRESSINGS) ×1 IMPLANT
ELECT PENCIL ROCKER SW 15FT (MISCELLANEOUS) ×2 IMPLANT
ELECT REM PT RETURN 15FT ADLT (MISCELLANEOUS) ×2 IMPLANT
GLOVE BIOGEL PI IND STRL 7.0 (GLOVE) ×1 IMPLANT
GLOVE BIOGEL PI INDICATOR 7.0 (GLOVE) ×1
GLOVE SURG ORTHO 8.0 STRL STRW (GLOVE) ×2 IMPLANT
GOWN STRL REUS W/TWL LRG LVL3 (GOWN DISPOSABLE) ×2 IMPLANT
GOWN STRL REUS W/TWL XL LVL3 (GOWN DISPOSABLE) ×4 IMPLANT
IRRIG SUCT STRYKERFLOW 2 WTIP (MISCELLANEOUS)
IRRIGATION SUCT STRKRFLW 2 WTP (MISCELLANEOUS) IMPLANT
KIT BASIN OR (CUSTOM PROCEDURE TRAY) ×2 IMPLANT
KIT TURNOVER KIT A (KITS) ×2 IMPLANT
MESH ULTRAPRO 3X6 7.6X15CM (Mesh General) ×2 IMPLANT
SET TUBE SMOKE EVAC HIGH FLOW (TUBING) ×2 IMPLANT
SHEARS HARMONIC ACE PLUS 36CM (ENDOMECHANICALS) ×2 IMPLANT
SOL ANTI FOG 6CC (MISCELLANEOUS) ×1 IMPLANT
SOLUTION ANTI FOG 6CC (MISCELLANEOUS) ×1
STRIP CLOSURE SKIN 1/2X4 (GAUZE/BANDAGES/DRESSINGS) IMPLANT
SUT NOVA NAB GS-21 0 18 T12 DT (SUTURE) ×2 IMPLANT
SUT NOVA NAB GS-22 2 0 T19 (SUTURE) ×4 IMPLANT
SUT VIC AB 3-0 SH 18 (SUTURE) ×2 IMPLANT
SUT VIC AB 4-0 PS2 27 (SUTURE) IMPLANT
TOWEL OR 17X26 10 PK STRL BLUE (TOWEL DISPOSABLE) ×2 IMPLANT
TRAY FOLEY MTR SLVR 16FR STAT (SET/KITS/TRAYS/PACK) ×2 IMPLANT
TRAY LAPAROSCOPIC (CUSTOM PROCEDURE TRAY) ×2 IMPLANT
TROCAR XCEL BLUNT TIP 100MML (ENDOMECHANICALS) ×2 IMPLANT
TROCAR XCEL NON-BLD 11X100MML (ENDOMECHANICALS) IMPLANT
TROCAR XCEL UNIV SLVE 11M 100M (ENDOMECHANICALS) IMPLANT
WATER STERILE IRR 1000ML POUR (IV SOLUTION) ×2 IMPLANT

## 2020-08-19 NOTE — Anesthesia Procedure Notes (Signed)
Procedure Name: Intubation Date/Time: 08/19/2020 8:37 AM Performed by: Montel Clock, CRNA Pre-anesthesia Checklist: Patient identified, Emergency Drugs available, Suction available, Patient being monitored and Timeout performed Patient Re-evaluated:Patient Re-evaluated prior to induction Oxygen Delivery Method: Circle system utilized Preoxygenation: Pre-oxygenation with 100% oxygen Induction Type: IV induction Ventilation: Mask ventilation without difficulty Laryngoscope Size: Mac and 4 Grade View: Grade I Tube type: Oral Tube size: 7.5 mm Number of attempts: 1 Airway Equipment and Method: Stylet Placement Confirmation: ETT inserted through vocal cords under direct vision,  positive ETCO2 and breath sounds checked- equal and bilateral Secured at: 23 cm Tube secured with: Tape Dental Injury: Teeth and Oropharynx as per pre-operative assessment

## 2020-08-19 NOTE — Op Note (Signed)
Operative Note  Pre-operative Diagnosis:  Left lower quadrant abdominal pain  Post-operative Diagnosis:  Recurrent left inguinal hernia, intra-abdominal adhesions  Surgeon:  Armandina Gemma, MD  Assistant:  Annye English, MD   Procedure:  1. Diagnostic laparoscopy with laparoscopic lysis of adhesions (45 minutes)  2. Open repair recurrent left inguinal hernia with mesh  Anesthesia:  general  Estimated Blood Loss:  minimal  Drains: none         Specimen: none  Indications:  Patient returns for surgical evaluation and management of left lower quadrant abdominal pain. Patient underwent partial colectomy by Dr. Adonis Housekeeper in 2015 for diverticular disease. Patient has developed left lower quadrant intermittent abdominal pain with a visible bulge. This is now persistent over many months. It improved some with the addition of daily MiraLAX. Patient has also been seen and evaluated by his gastroenterologist, Dr. Zenovia Jarred. He underwent colonoscopy. I have discussed this with his gastroenterologist and there were no significant findings to explain the patient's symptoms. Patient returns today for surgical intervention.  Procedure:  The patient was seen in the pre-op holding area. The risks, benefits, complications, treatment options, and expected outcomes were previously discussed with the patient. The patient agreed with the proposed plan and has signed the informed consent form.  The patient was brought to the operating room by the surgical team, identified as [name] and the procedure verified. A "time out" was completed and the above information confirmed.  Following induction of general anesthesia, the patient was positioned and then prepped and draped in the usual aseptic fashion.  After ascertaining that an adequate level of anesthesia been achieved, an incision is made in the left upper quadrant at the costal margin with a #15 blade.  Using a 5 mm Optiview trocar the laparoscope  was introduced to the peritoneal cavity under direct vision.  Pneumoperitoneum is established.  Abdomen is explored with the laparoscope.  There are adhesions to the sigmoid colon from the anterior abdominal wall and lateral abdominal wall and pelvic sidewall.  There are a few adhesions to the anterior midline.  Additional operative ports are placed just to the right of midline in the lower abdomen.  Two 5 mm trochars are introduced.  Using the harmonic scalpel, adhesions are taken down.  Sigmoid colon is mobilized from the lateral abdominal wall.  There is an obvious left inguinal hernia into which the sigmoid colon most likely was able to pass.  Adhesions were taken down freeing the entire sigmoid colon from the abdominal wall and from the left pelvic sidewall.  The gonadal vessels were identified and preserved.  The left iliac vessels were identified.  Dissection was carried into the pelvis where the previous anastomosis between the distal sigmoid and the proximal rectum was identified and appeared to be normal and widely patent.  There appeared to be no connection between the bowel and the overlying bladder.  Tissues were soft and there was no sign of inflammatory changes.  Remainder of the abdomen was inspected and there were no obvious abnormalities identified.  Decision was made to proceed with left inguinal hernia repair.  The laparoscope was introduced into the hernia and there appeared to be a moderate indirect defect.  There did not appear to be a significant direct defect.  No mesh was visualized laparoscopically.  Ports were withdrawn under direct vision and pneumoperitoneum was released.  Field was reset.  Previous incision in the left inguinal region was noted.  This appeared to be in good  position.  It was opened with a #15 blade and dissection carried through subcutaneous tissues and scar tissue with the electrocautery.  Dissection was carried down to the external oblique fascia.  External  oblique fascia was incised in line of its fibers and extended through the external inguinal ring.  Cord structures were dissected out of the inguinal canal and encircled with a Penrose drain.  Floor the inguinal canal was then dissected out.  There was not any sign of a direct defect.  Cord was explored and to moderate sized lipomas of the cord were excised back to the level of the internal inguinal ring.  Thorough exploration of the cord failed to reveal a indirect sac.  Palpation of the internal ring showed it to be rather patulous.  Therefore it was closed laterally with interrupted 0 Novafil simple sutures.  Floor the inguinal canal was recreated with a sheet of Ethicon ultra Pro mesh.  Mesh was cut to the appropriate dimensions.  It was secured to the pubic tubercle and along the inguinal ligament with a running 2-0 Novafil suture.  Mesh was split to accommodate the cord structures.  Superior margin of the mesh was secured to the transversalis and internal oblique fascia with interrupted 2-0 Novafil sutures.  Tails of the mesh were overlapped lateral to the cord structures and secured to the inguinal ligament with interrupted 0 Novafil sutures.  The mesh was reapproximated lateral to the cord structures with interrupted 2-0 Novafil sutures until the internal inguinal ring was recreated with the appropriate tightness around the cord structures.  Local anesthetic is infiltrated throughout the operative field.  External oblique fascia is closed with interrupted 3-0 Vicryl sutures.  Subcutaneous tissues are closed with interrupted 3-0 Vicryl sutures.  Skin is anesthetized with local anesthetic.  Skin is closed with a running 4-0 Monocryl subcuticular suture.  Laparoscopic port sites are also closed with interrupted subcuticular 4-0 Monocryl sutures.  Wounds are washed and dried and Dermabond is applied to all wounds as dressing.  Patient is awakened from anesthesia and transported to the recovery room in  stable condition.  The patient tolerated the procedure well.   Armandina Gemma, MD [facility] Office: [phone]

## 2020-08-19 NOTE — Transfer of Care (Signed)
Immediate Anesthesia Transfer of Care Note  Patient: William [name]  Procedure(s) Performed: LAPAROSCOPY DIAGNOSTIC AND OPEN LEFT INGIUNAL HERNIA REPAIR WITH MESH (N/A Abdomen) LAPAROSCOPIC LYSIS OF ADHESIONS (N/A Abdomen)  Patient Location: PACU  Anesthesia Type:General  Level of Consciousness: drowsy and patient cooperative  Airway & Oxygen Therapy: Patient Spontanous Breathing and Patient connected to face mask oxygen  Post-op Assessment: Report given to RN and Post -op Vital signs reviewed and stable  Post vital signs: Reviewed and stable  Last Vitals:  Vitals Value Taken Time  BP 129/87 08/19/20 1045  Temp 36.8 C 08/19/20 1040  Pulse 87 08/19/20 1045  Resp 19 08/19/20 1045  SpO2 97 % 08/19/20 1045  Vitals shown include unvalidated device data.  Last Pain:  Vitals:   08/19/20 1040  TempSrc:   PainSc: 0-No pain         Complications: No complications documented.

## 2020-08-19 NOTE — Interval H&P Note (Signed)
History and Physical Interval Note:  08/19/2020 8:10 AM  William [name]  has presented today for surgery, with the diagnosis of LEFT LOWER QUADRANT ABDOMEN PAIN.  The various methods of treatment have been discussed with the patient and family. After consideration of risks, benefits and other options for treatment, the patient has consented to    Procedure(s): LAPAROSCOPY DIAGNOSTIC (N/A) LAPAROSCOPIC LYSIS OF ADHESIONS (N/A) as a surgical intervention.    The patient's history has been reviewed, patient examined, no change in status, stable for surgery.  I have reviewed the patient's chart and labs.  Questions were answered to the patient's satisfaction.    Armandina Gemma, MD [facility] Office: [place]

## 2020-08-19 NOTE — Anesthesia Postprocedure Evaluation (Signed)
Anesthesia Post Note  Patient: Ivie Maese  Procedure(s) Performed: LAPAROSCOPY DIAGNOSTIC AND OPEN LEFT INGIUNAL HERNIA REPAIR WITH MESH (N/A Abdomen) LAPAROSCOPIC LYSIS OF ADHESIONS (N/A Abdomen)     Patient location during evaluation: PACU Anesthesia Type: General Level of consciousness: awake and alert Pain management: pain level controlled Vital Signs Assessment: post-procedure vital signs reviewed and stable Respiratory status: spontaneous breathing, nonlabored ventilation, respiratory function stable and patient connected to nasal cannula oxygen Cardiovascular status: blood pressure returned to baseline and stable Postop Assessment: no apparent nausea or vomiting Anesthetic complications: no   No complications documented.  Last Vitals:  Vitals:   08/19/20 1200 08/19/20 1224  BP: 116/79 124/84  Pulse: 77 87  Resp: 20 18  Temp: 36.5 C 36.7 C  SpO2: 95% 95%    Last Pain:  Vitals:   08/19/20 1224  TempSrc: Oral  PainSc:                  Effie Berkshire

## 2020-08-20 ENCOUNTER — Encounter (HOSPITAL_COMMUNITY): Payer: Self-pay | Admitting: Surgery

## 2020-08-20 DIAGNOSIS — K660 Peritoneal adhesions (postprocedural) (postinfection): Secondary | ICD-10-CM | POA: Diagnosis not present

## 2020-08-20 DIAGNOSIS — K4091 Unilateral inguinal hernia, without obstruction or gangrene, recurrent: Secondary | ICD-10-CM | POA: Diagnosis not present

## 2020-08-20 DIAGNOSIS — Z9049 Acquired absence of other specified parts of digestive tract: Secondary | ICD-10-CM | POA: Diagnosis not present

## 2020-08-20 DIAGNOSIS — Z882 Allergy status to sulfonamides status: Secondary | ICD-10-CM | POA: Diagnosis not present

## 2020-08-20 DIAGNOSIS — Z8379 Family history of other diseases of the digestive system: Secondary | ICD-10-CM | POA: Diagnosis not present

## 2020-08-20 DIAGNOSIS — R1032 Left lower quadrant pain: Secondary | ICD-10-CM | POA: Diagnosis not present

## 2020-08-20 DIAGNOSIS — Z87891 Personal history of nicotine dependence: Secondary | ICD-10-CM | POA: Diagnosis not present

## 2020-08-20 MED ORDER — OXYCODONE HCL 5 MG PO TABS
5.0000 mg | ORAL_TABLET | Freq: Four times a day (QID) | ORAL | 0 refills | Status: DC | PRN
Start: 2020-08-20 — End: 2021-06-02

## 2020-08-20 NOTE — Discharge Summary (Signed)
Physician Discharge Summary [facility]  Patient ID: William [name] MRN: [id number] DOB/AGE: August 08, 1966 54 y.o.  Admit date: 08/19/2020 Discharge date: 08/20/2020  Admission Diagnoses:  Left lower quadrant abdominal pain  Discharge Diagnoses:  Principal Problem:   Abdominal pain, left lower quadrant Active Problems:   Left lower quadrant abdominal pain Left inguinal hernia  Discharged Condition: good  Hospital Course: Patient was admitted for observation following laparoscopic surgery and repair of LIH.  Post op course was uncomplicated.  Pain was well controlled.  Tolerated diet.  Patient was prepared for discharge home on POD#1.  Consults: None  Treatments: surgery: diagnostic laparoscopy, lysis of adhesions, open repair of LIH with mesh  Discharge Exam: Blood pressure 105/73, pulse 87, temperature 98 F (36.7 C), resp. rate 20, height 6\' 1"  (1.854 m), weight 100.7 kg, SpO2 96 %. HEENT - clear Neck - soft Chest - clear bilaterally Cor - RRR Abd - wounds dry and intact with Dermabond GU - left inguinal incision dry and intact with Dermabond  Disposition: Home  Discharge Instructions    Diet - low sodium heart healthy   Complete by: As directed    Discharge instructions   Complete by: As directed    [facility]  HERNIA REPAIR POST OP INSTRUCTIONS  Always review your discharge instruction sheet given to you by the facility where your surgery was performed.  A  prescription for pain medication may be given to you upon discharge.  Take your pain medication as prescribed.  If narcotic pain medicine is not needed, then you may take acetaminophen (Tylenol) or ibuprofen (Advil) as needed.  Take your usually prescribed medications unless otherwise directed.  If you need a refill on your pain medication, please contact your pharmacy.  They will contact our office to request authorization. Prescriptions will not be filled after 5 pm  daily or on weekends.  You should follow a light diet the first 24 hours after arrival home, such as soup and crackers or toast.  Be sure to include plenty of fluids daily.  Resume your normal diet the day after surgery.  Most patients will experience some swelling and bruising around the surgical site.  Ice packs and reclining will help.  Swelling and bruising can take several days to resolve.   It is common to experience some constipation if taking pain medication after surgery.  Increasing fluid intake and taking a stool softener (such as Colace) will usually help or prevent this problem from occurring.  A mild laxative (Milk of Magnesia or Miralax) should be taken according to package directions if there are no bowel movements after 48 hours.  You will likely have Dermabond (topical glue) over your incisions.  This seals the incisions and allows you to bathe and shower at any time after your surgery.  Glue should remain in place for up to 10 days.  It may be removed after 10 days by pealing off the Dermabond material or using Vaseline or naval jelly to remove.  If you have steri-strips over your incisions, you may remove the gauze bandage on the second day after surgery, and you may shower at that time.  Leave your steri-strips (small skin tapes) in place directly over the incision.  These strips should remain on the skin for 5-7 days and then be removed.  You may get them wet in the shower and pat them dry.  ACTIVITIES:  You may resume regular (light) daily activities beginning the next  day - such as daily self-care, walking, climbing stairs - gradually increasing activities as tolerated.  You may have sexual intercourse when it is comfortable.  Refrain from any heavy lifting or straining until approved by your doctor.  You may drive when you are no longer taking prescription pain medication, when you can comfortably wear a seatbelt, and when you can safely maneuver your car and apply brakes.  You  should see your doctor in the office for a follow-up appointment approximately 2-3 weeks after your surgery.  Make sure that you call for this appointment within a day or two after you arrive home to insure a convenient appointment time.  WHEN TO CALL YOUR DOCTOR: Fever greater than 101.0 Inability to urinate Persistent nausea and/or vomiting Extreme swelling or bruising Continued bleeding from incision Increased pain, redness, or drainage from the incision  The clinic staff is available to answer your questions during regular business hours.  Please don't hesitate to call and ask to speak to one of the nurses for clinical concerns.  If you have a medical emergency, go to the nearest emergency room or call 911.  A surgeon from [facility] is always on call for the hospital.   [facility] [address] [place], [place], [place]  [address]  [phone] ? [phone] ? FAX [phone]  [email or web address]      [facility]  LAPAROSCOPIC SURGERY:  POST-OP INSTRUCTIONS  Always review your discharge instruction sheet given to you by the facility where your surgery was performed.  A prescription for pain medication may be given to you upon discharge.  Take your pain medication as prescribed.  If narcotic pain medicine is not needed, then you may take acetaminophen (Tylenol) or ibuprofen (Advil) as needed.  Take your usually prescribed medications unless otherwise directed.  If you need a refill on your pain medication, please contact your pharmacy.  They will contact our office to request authorization. Prescriptions will not be filled after 5 P.M. or on weekends.  You should follow a light diet the first few days after arrival home, such as soup and crackers or toast.  Be sure to include plenty of fluids daily.  Most patients will experience some swelling and bruising in the area of the incisions.  Ice packs  will help.  Swelling and bruising can take several days to resolve.   It is common to experience some constipation after surgery.  Increasing fluid intake and taking a stool softener (such as Colace) will usually help or prevent this problem from occurring.  A mild laxative (Milk of Magnesia or Miralax) should be taken according to package instructions if there has been no bowel movement after 48 hours.  You will likely have Dermabond (topical glue) over your incisions.  This seals the incisions and allows you to bathe and shower at any time after your surgery.  Glue should remain in place for up to 10 days.  It may be removed after 10 days by pealing off the Dermabond material or using Vaseline or naval jelly to remove.  If you have steri-strips over your incisions, you may remove the gauze bandage on the second day after surgery, and you may shower at that time.  Leave your steri-strips (small skin tapes) in place directly over the incision.  These strips should remain on the skin for 5-7 days and then be removed.  You may get them wet in the shower and pat them dry.  Any sutures or staples will be removed at the office during your follow-up visit.  ACTIVITIES:  You may resume regular (light) daily activities beginning the next day - such as daily self-care, walking, climbing stairs - gradually increasing activities as tolerated.  You may have sexual intercourse when it is comfortable.  Refrain from any heavy lifting or straining until approved by your doctor.  You may drive when you are no longer taking prescription pain medication, when you can comfortably wear a seatbelt, and when you can safely maneuver your car and apply brakes.  You should see your doctor in the office for a follow-up appointment approximately 2-3 weeks after your surgery.  Make sure that you call for this appointment within a day or two after you arrive home to insure a convenient appointment time.  WHEN TO CALL YOUR  DOCTOR: Fever over 101.0 Inability to urinate Continued bleeding from incision Increased pain, redness, or drainage from the incision Increasing abdominal pain  The clinic staff is available to answer your questions during regular business hours.  Please don't hesitate to call and ask to speak to one of the nurses for clinical concerns.  If you have a medical emergency, go to the nearest emergency room or call 911.  A surgeon from [facility] is always on call for the hospital.  Earnstine Regal, MD, [facility] Office: [place] Free:  [place] [phone]  Website: [email or web address]   Increase activity slowly   Complete by: As directed    No dressing needed   Complete by: As directed      Allergies as of 08/20/2020      Reactions   Augmentin [amoxicillin-pot Clavulanate] Diarrhea   Acyclovir And Related Swelling   Mouth swelling and sores   Biaxin [clarithromycin]    Headache and vomiting,sores in mouth   Flomax [tamsulosin Hcl]    Headache, dizziness   Other Nausea And Vomiting   anesthesia      Medication List    TAKE these medications   ALIVE MENS ENERGY PO Take 1 tablet by mouth daily.   amLODipine 10 MG tablet Commonly known as: NORVASC Take 1 tablet (10 mg total) by mouth at bedtime.   fluticasone 50 MCG/ACT nasal spray Commonly known as: FLONASE Place 2 sprays into both nostrils daily. What changed:   when to take this  reasons to take this   hyoscyamine 0.125 MG tablet Commonly known as: Levsin Take 1 tablet (0.125 mg total) by mouth every 6 (six) hours as needed for cramping.   ibuprofen 800 MG tablet Commonly known as: ADVIL Take 800 mg by mouth every 8 (eight) hours as needed for mild pain or moderate pain.   linaclotide 145 MCG Caps capsule Commonly known as: Linzess Take 1 capsule (145 mcg total) by mouth daily before breakfast.   lisinopril 5 MG tablet Commonly known  as: ZESTRIL Take 1 tablet by mouth once daily   lovastatin 40 MG tablet Commonly known as: MEVACOR Take 1 tablet (40 mg total) by mouth at bedtime.   melatonin 3 MG Tabs tablet Take 6 mg by mouth at bedtime.   neomycin-polymyxin-hydrocortisone 3.5-10000-1 OTIC suspension Commonly known as: CORTISPORIN Place 3 drops in the affected ear four times daily What changed:   how much to take  how to take this  when to take this  reasons to take this  additional instructions   omeprazole 40 MG capsule Commonly known as: PRILOSEC Take 1 capsule (  40 mg total) by mouth daily.   ondansetron 4 MG disintegrating tablet Commonly known as: Zofran ODT Take 1 tablet (4 mg total) by mouth every 8 (eight) hours as needed for nausea or vomiting.   ondansetron 4 MG disintegrating tablet Commonly known as: Zofran ODT Take 1 tablet (4 mg total) by mouth every 8 (eight) hours as needed for nausea or vomiting.   oxyCODONE 5 MG immediate release tablet Commonly known as: Oxy IR/ROXICODONE Take 1-2 tablets (5-10 mg total) by mouth every 6 (six) hours as needed for moderate pain.   polyethylene glycol 17 g packet Commonly known as: MIRALAX / GLYCOLAX Take 17 g by mouth 2 (two) times daily.   SAMBUCUS ELDERBERRY PO Take 1 tablet by mouth daily.   tadalafil 5 MG tablet Commonly known as: CIALIS Take 1 tablet by mouth once daily   zolpidem 10 MG tablet Commonly known as: AMBIEN Take 1 tablet (10 mg total) by mouth at bedtime.            Discharge Care Instructions  (From admission, onward)         Start     Ordered   08/20/20 0000  No dressing needed        08/20/20 0945          Follow-up Information    Armandina Gemma, MD. Schedule an appointment as soon as possible for a visit in 3 week(s).   Specialty: General Surgery Contact information: [address] [place] [address] [phone]               Earnstine Regal, MD, [facility] Office: [phone]   Signed: Armandina Gemma 08/20/2020, 9:46 AM

## 2020-09-01 ENCOUNTER — Other Ambulatory Visit: Payer: Self-pay | Admitting: *Deleted

## 2020-09-01 DIAGNOSIS — E785 Hyperlipidemia, unspecified: Secondary | ICD-10-CM

## 2020-09-02 DIAGNOSIS — M47816 Spondylosis without myelopathy or radiculopathy, lumbar region: Secondary | ICD-10-CM | POA: Diagnosis not present

## 2020-09-02 DIAGNOSIS — M545 Low back pain, unspecified: Secondary | ICD-10-CM | POA: Diagnosis not present

## 2020-09-02 DIAGNOSIS — M5136 Other intervertebral disc degeneration, lumbar region: Secondary | ICD-10-CM | POA: Diagnosis not present

## 2020-09-08 DIAGNOSIS — E785 Hyperlipidemia, unspecified: Secondary | ICD-10-CM | POA: Diagnosis not present

## 2020-09-09 LAB — LIPID PANEL
Chol/HDL Ratio: 3.7 ratio (ref 0.0–5.0)
Cholesterol, Total: 165 mg/dL (ref 100–199)
HDL: 45 mg/dL (ref >39)
LDL Chol Calc (NIH): 102 mg/dL — ABNORMAL HIGH (ref 0–99)
Triglycerides: 99 mg/dL (ref 0–149)
VLDL Cholesterol Cal: 18 mg/dL (ref 5–40)

## 2020-09-13 ENCOUNTER — Other Ambulatory Visit: Payer: Self-pay | Admitting: Family Medicine

## 2020-09-17 DIAGNOSIS — M545 Low back pain, unspecified: Secondary | ICD-10-CM | POA: Diagnosis not present

## 2020-09-17 DIAGNOSIS — M5416 Radiculopathy, lumbar region: Secondary | ICD-10-CM | POA: Diagnosis not present

## 2020-09-17 DIAGNOSIS — M5136 Other intervertebral disc degeneration, lumbar region: Secondary | ICD-10-CM | POA: Diagnosis not present

## 2020-09-28 ENCOUNTER — Other Ambulatory Visit: Payer: Self-pay | Admitting: Urology

## 2020-10-12 DIAGNOSIS — Z20822 Contact with and (suspected) exposure to covid-19: Secondary | ICD-10-CM | POA: Diagnosis not present

## 2020-10-13 ENCOUNTER — Other Ambulatory Visit: Payer: BC Managed Care – PPO

## 2020-10-14 DIAGNOSIS — M5136 Other intervertebral disc degeneration, lumbar region: Secondary | ICD-10-CM | POA: Diagnosis not present

## 2020-10-14 DIAGNOSIS — M5416 Radiculopathy, lumbar region: Secondary | ICD-10-CM | POA: Diagnosis not present

## 2020-10-18 DIAGNOSIS — Z0000 Encounter for general adult medical examination without abnormal findings: Secondary | ICD-10-CM | POA: Diagnosis not present

## 2020-10-18 DIAGNOSIS — I10 Essential (primary) hypertension: Secondary | ICD-10-CM | POA: Diagnosis not present

## 2020-10-18 DIAGNOSIS — Z20822 Contact with and (suspected) exposure to covid-19: Secondary | ICD-10-CM | POA: Diagnosis not present

## 2020-10-18 DIAGNOSIS — K219 Gastro-esophageal reflux disease without esophagitis: Secondary | ICD-10-CM | POA: Diagnosis not present

## 2020-10-18 DIAGNOSIS — E785 Hyperlipidemia, unspecified: Secondary | ICD-10-CM | POA: Diagnosis not present

## 2020-10-20 DIAGNOSIS — Z20822 Contact with and (suspected) exposure to covid-19: Secondary | ICD-10-CM | POA: Diagnosis not present

## 2020-11-17 DIAGNOSIS — J0100 Acute maxillary sinusitis, unspecified: Secondary | ICD-10-CM | POA: Diagnosis not present

## 2020-12-08 DIAGNOSIS — Z23 Encounter for immunization: Secondary | ICD-10-CM | POA: Diagnosis not present

## 2020-12-15 DIAGNOSIS — Z1283 Encounter for screening for malignant neoplasm of skin: Secondary | ICD-10-CM | POA: Diagnosis not present

## 2020-12-15 DIAGNOSIS — D225 Melanocytic nevi of trunk: Secondary | ICD-10-CM | POA: Diagnosis not present

## 2020-12-15 DIAGNOSIS — L570 Actinic keratosis: Secondary | ICD-10-CM | POA: Diagnosis not present

## 2020-12-15 DIAGNOSIS — X32XXXA Exposure to sunlight, initial encounter: Secondary | ICD-10-CM | POA: Diagnosis not present

## 2021-01-14 ENCOUNTER — Other Ambulatory Visit: Payer: Self-pay

## 2021-01-14 ENCOUNTER — Encounter: Payer: Self-pay | Admitting: Urology

## 2021-01-14 ENCOUNTER — Ambulatory Visit (INDEPENDENT_AMBULATORY_CARE_PROVIDER_SITE_OTHER): Payer: BC Managed Care – PPO | Admitting: Urology

## 2021-01-14 VITALS — BP 137/95 | HR 71 | Temp 98.5°F | Ht 73.0 in | Wt 231.0 lb

## 2021-01-14 DIAGNOSIS — N5201 Erectile dysfunction due to arterial insufficiency: Secondary | ICD-10-CM

## 2021-01-14 DIAGNOSIS — N400 Enlarged prostate without lower urinary tract symptoms: Secondary | ICD-10-CM | POA: Diagnosis not present

## 2021-01-14 DIAGNOSIS — R351 Nocturia: Secondary | ICD-10-CM

## 2021-01-14 LAB — BLADDER SCAN AMB NON-IMAGING: Scan Result: 76

## 2021-01-14 LAB — URINALYSIS, ROUTINE W REFLEX MICROSCOPIC
Bilirubin, UA: NEGATIVE
Glucose, UA: NEGATIVE
Ketones, UA: NEGATIVE
Leukocytes,UA: NEGATIVE
Nitrite, UA: NEGATIVE
Protein,UA: NEGATIVE
Specific Gravity, UA: 1.025 (ref 1.005–1.030)
Urobilinogen, Ur: 0.2 mg/dL (ref 0.2–1.0)
pH, UA: 6 (ref 5.0–7.5)

## 2021-01-14 LAB — MICROSCOPIC EXAMINATION
Bacteria, UA: NONE SEEN
RBC, Urine: NONE SEEN /hpf (ref 0–2)
WBC, UA: NONE SEEN /hpf (ref 0–5)

## 2021-01-14 MED ORDER — VARDENAFIL HCL 20 MG PO TABS: 20.0000 mg | ORAL_TABLET | Freq: Every day | ORAL | 0 refills | Status: DC | PRN

## 2021-01-14 MED ORDER — SILODOSIN 8 MG PO CAPS: 8.0000 mg | ORAL_CAPSULE | Freq: Every day | ORAL | 11 refills | Status: DC

## 2021-01-14 NOTE — Progress Notes (Signed)
01/14/2021 9:59 AM   William [name] May 31, 1966 [id number]  Referring provider: Erven Colla, DO [address],  [place] [address]  Followup BPH and erectile dysfunction  HPI: [name] is a 55yo here for followup for BPH and and erectile dysfunction. He developed left inguinal swelling and was diagnosed with a left inguinal hernia. He underwent hernia repair 08/2020 and developed left scrotal numbness. Since last visit he has been unable to tolerate to daily cialis. He had dizziness with flomax but worked well for his LUTS.  He continues to have issues getting and maintaining an erection. He tried viagra previously which caused sinus congestion.  IPSS 18 and QOL 4  His records from [facility] are as follows: I have symptoms of an enlarged prostate.  HPI: William [name] is a 54 year-old male established patient who is here for symptoms of enlarged prostate.  He first noticed the symptoms 11/16/2013. His symptoms have gotten worse over the last year. He has been treated with Flomax, Uroxatral, and Proscar. The patient has never had a surgical procedure for bladder outlet obstruction to his prostate.   He usually gets up at night to urinate 1 time. He does not have to wait a long time to start his urinary stream. He does not have to strain or bear down to start his urinary stream. He does have a good size and strength to his urinary stream. He does not dribble at the end of urination. He is not having problems with emptying his bladder well.   His urine has not shut off completely. He has not previously had an indwelling catheter in for more than two weeks at a time.   He has not had a PSA done. He does not have any family members who have been diagnosed with prostate cancer.   06/14/2018: Starting in 2015 after his colectomy he has noted worsening urgency, frequency, nocturia and a weak stream. He was tried on flomax which worked well but he had severe dizziness and stopped the medication. He  was then tried on finasteride which failed to improve his LUTS. He has been treated 3 times over the past year for prostate infections. He was recently treated with cipro.   07/25/2018: He taking uroxatral 10mg  qhs with mixed results. He was then switched to rapaflo and noted significant improvement in his LUTS. He notes improvement in nocturia to 1x. his stream is the slightly stronger.   10/31/2018: Since last visit his insurance has denied rapaflo. Nocturia has now worsened to 4-5x. He has a medium amount of urine with each void.   03/17/2019: Since last visit he has stopped the uroxatral 10mg  due to congestion. His stream is weaker. He has urgency and nocturia.   06/17/2019: He is on cialis 5mg  daily. Stream is good. nocturia 0-3x.     CC: I get up too often at night to urinate.  HPI: He first noticed the symptom approximately 07/17/2015. He usually gets up at night to urinate 1 time. He does not have nights when he does not get up to urinate at all. He does not have trouble falling back asleep once he has been woken up at night.   He does not usually have swelling in his hands and feet during the day. He does not take a diuretic. He does not have to strain or bear down to start his urinary stream.   07/25/2018: Since starting rapaflo 8mg  qhs he has noted improvement in his nocturia to 1x from  5x   10/31/2018: Nocturia has worsened since last visit. He has associated urgency but no urge incontinence   03/17/2019: Nocturia has worsened since last visit. He cannot tolerate alpha blockers.   06/17/2019: nocturia decreased to 0-3x on cialis and fluid management     CC: I am having trouble with my erections.  HPI: He first stated noticing pain on approximately 10/17/2015. His symptoms did begin gradually. His symptoms have been stable over the last year.   He does have difficulties achieving an erection. He does have problems maintaining his erections. His erections are not straight. He does not have  curvature with his erections. He has tried Cialis. It did work.   He does not have premature ejaculation. He does not have trouble reaching climax. He does not have anxiety because of the symptoms.   03/17/2019: Cilais 10mg  works well for his ED   06/17/2019: cilais 5mg  daily working well for ED     AUA Symptom Score: Less than 20% of the time he has the sensation of not emptying his bladder completely when finished urinating. Less than 20% of the time he has to urinate again fewer than two hours after he has finished urinating. He does not have to stop and start again several times when he urinates. He never finds it difficult to postpone urination. He never has a weak urinary stream. Less than 50% of the time he has to push or strain to begin urination. He has to get up to urinate 1 time from the time he goes to bed until the time he gets up in the morning.   Calculated AUA Symptom Score: 5    IIEF-5 Score: The patient's confidence that he can get an erection is moderate. The patient's erections were hard enough for penetration most times. The patient was able to maintain his erection after he had penetrated his partner sometimes. During sexual intercouse, it was difficult to maintain his erection to the completion of intercourse. The patient found sexual intercourse satisfactory most times.   Calculated IIEF-5 Symptom Score: 17    QOL Score: He would feel pleased if he had to live with his urinary condition the way it is now for the rest of his life.   Calculated QOL Symptom Score: 1      PMH: Past Medical History:  Diagnosis Date  . COVID    10-09-2019  . Difficulty sleeping    TAKES ADVIL PM  . Diverticulitis of intestine with perforation and abscess   . Diverticulosis   . Dysuria   . ED (erectile dysfunction)   . Family history of adverse reaction to anesthesia    sister  and mom PONV  . GERD (gastroesophageal reflux disease)   . Headache   . Hyperlipidemia   .  Hypertension   . Osteoarthritis   . Pancreatitis, acute   . PONV (postoperative nausea and vomiting)   . Prostatitis   . Reflux     Surgical History: Past Surgical History:  Procedure Laterality Date  . ABCESS DRAINAGE     ABDOMINAL  . APPENDECTOMY    . CHOLECYSTECTOMY    . COLONOSCOPY N/A 06/09/2015   Procedure: COLONOSCOPY;  Surgeon: Daneil Dolin, MD;  [facility];  Service: Endoscopy;  Laterality: N/A;  11:30 Am  . HERNIA REPAIR    . KNEE SURGERY     Both  . LAPAROSCOPIC LYSIS OF ADHESIONS N/A 08/19/2020   Procedure: LAPAROSCOPIC LYSIS OF ADHESIONS;  Surgeon: Armandina Gemma, MD;  Location: [facility];  Service: General;  Laterality: N/A;  . LAPAROSCOPIC SIGMOID COLECTOMY N/A 11/17/2013   Procedure: LAPAROSCOPIC SIGMOID COLECTOMY rigid proctoscopy;  Surgeon: Edward Jolly, MD;  Location: [facility];  Service: General;  Laterality: N/A;  . LAPAROSCOPY N/A 08/19/2020   Procedure: LAPAROSCOPY DIAGNOSTIC AND OPEN LEFT INGIUNAL HERNIA REPAIR WITH MESH;  Surgeon: Armandina Gemma, MD;  Location: [facility];  Service: General;  Laterality: N/A;  . SHOULDER ARTHROSCOPY     x3  . SHOULDER SURGERY Left May 2016  . SPHINCTEROTOMY     for sphincter of Oddi dysfunction  . TONSILLECTOMY      Home Medications:  Allergies as of 01/14/2021      Reactions   Augmentin [amoxicillin-pot Clavulanate] Diarrhea   Acyclovir And Related Swelling   Mouth swelling and sores   Biaxin [clarithromycin]    Headache and vomiting,sores in mouth   Flomax [tamsulosin Hcl]    Headache, dizziness   Other Nausea And Vomiting   anesthesia      Medication List       Accurate as of January 14, 2021  9:59 AM. If you have any questions, ask your nurse or doctor.        ALIVE MENS ENERGY PO Take 1 tablet by mouth daily.   amLODipine 10 MG tablet Commonly known as: NORVASC Take 1 tablet (10 mg total) by mouth at bedtime.   fluticasone 50 MCG/ACT nasal spray Commonly known as: FLONASE Place 2 sprays into  both nostrils daily. What changed:   when to take this  reasons to take this   hyoscyamine 0.125 MG tablet Commonly known as: Levsin Take 1 tablet (0.125 mg total) by mouth every 6 (six) hours as needed for cramping.   ibuprofen 800 MG tablet Commonly known as: ADVIL Take 800 mg by mouth every 8 (eight) hours as needed for mild pain or moderate pain.   linaclotide 145 MCG Caps capsule Commonly known as: Linzess Take 1 capsule (145 mcg total) by mouth daily before breakfast.   lisinopril 5 MG tablet Commonly known as: ZESTRIL Take 1 tablet by mouth once daily   lovastatin 40 MG tablet Commonly known as: MEVACOR Take 1 tablet (40 mg total) by mouth at bedtime.   melatonin 3 MG Tabs tablet Take 6 mg by mouth at bedtime.   neomycin-polymyxin-hydrocortisone 3.5-10000-1 OTIC suspension Commonly known as: CORTISPORIN Place 3 drops in the affected ear four times daily What changed:   how much to take  how to take this  when to take this  reasons to take this  additional instructions   omeprazole 40 MG capsule Commonly known as: PRILOSEC Take 1 capsule (40 mg total) by mouth daily.   ondansetron 4 MG disintegrating tablet Commonly known as: Zofran ODT Take 1 tablet (4 mg total) by mouth every 8 (eight) hours as needed for nausea or vomiting.   oxyCODONE 5 MG immediate release tablet Commonly known as: Oxy IR/ROXICODONE Take 1-2 tablets (5-10 mg total) by mouth every 6 (six) hours as needed for moderate pain.   polyethylene glycol 17 g packet Commonly known as: MIRALAX / GLYCOLAX Take 17 g by mouth 2 (two) times daily.   SAMBUCUS ELDERBERRY PO Take 1 tablet by mouth daily.   tadalafil 5 MG tablet Commonly known as: CIALIS Take 1 tablet by mouth once daily   zolpidem 10 MG tablet Commonly known as: AMBIEN Take 1 tablet (10 mg total) by mouth at bedtime.       Allergies:  Allergies  Allergen Reactions  . Augmentin [Amoxicillin-Pot Clavulanate]  Diarrhea  . Acyclovir And Related Swelling    Mouth swelling and sores  . Biaxin [Clarithromycin]     Headache and vomiting,sores in mouth  . Flomax [Tamsulosin Hcl]     Headache, dizziness  . Other Nausea And Vomiting    anesthesia     Family History: Family History  Problem Relation Age of Onset  . Cancer Father        ? Colon mass removed   . Ulcerative colitis Sister   . Colon cancer Maternal Grandmother        54s  . Esophageal cancer Neg Hx   . Pancreatic cancer Neg Hx   . Stomach cancer Neg Hx     Social History:  reports that he quit smoking about 34 years ago. He quit after 2.00 years of use. He has never used smokeless tobacco. He reports current alcohol use of about 1.0 standard drink of alcohol per week. He reports that he does not use drugs.  ROS: All other review of systems were reviewed and are negative except what is noted above in HPI  Physical Exam: BP (!) 137/95   Pulse 71   Temp 98.5 F (36.9 C) (Oral)   Ht 6\' 1"  (1.854 m)   Wt 231 lb (104.8 kg)   BMI 30.48 kg/m   Constitutional:  Alert and oriented, No acute distress. HEENT: [place] AT, moist mucus membranes.  Trachea midline, no masses. Cardiovascular: No clubbing, cyanosis, or edema. Respiratory: Normal respiratory effort, no increased work of breathing. GI: Abdomen is soft, nontender, nondistended, no abdominal masses GU: No CVA tenderness.  Lymph: No cervical or inguinal lymphadenopathy. Skin: No rashes, bruises or suspicious lesions. Neurologic: Grossly intact, no focal deficits, moving all 4 extremities. Psychiatric: Normal mood and affect.  Laboratory Data: Lab Results  Component Value Date   WBC 7.0 08/13/2020   HGB 14.6 08/13/2020   HCT 43.0 08/13/2020   MCV 89.2 08/13/2020   PLT 252 08/13/2020    Lab Results  Component Value Date   CREATININE 0.72 08/13/2020    Lab Results  Component Value Date   PSA 1.65 04/06/2014    No results found for: TESTOSTERONE  No results  found for: HGBA1C  Urinalysis    Component Value Date/Time   COLORURINE YELLOW 08/19/2020 1722   APPEARANCEUR CLEAR 08/19/2020 1722   LABSPEC 1.016 08/19/2020 1722   PHURINE 6.0 08/19/2020 1722   GLUCOSEU NEGATIVE 08/19/2020 1722   HGBUR NEGATIVE 08/19/2020 1722   BILIRUBINUR NEGATIVE 08/19/2020 1722   BILIRUBINUR ++ 07/27/2016 1113   KETONESUR NEGATIVE 08/19/2020 1722   PROTEINUR NEGATIVE 08/19/2020 1722   UROBILINOGEN 0.2 11/27/2013 1124   NITRITE NEGATIVE 08/19/2020 1722   LEUKOCYTESUR NEGATIVE 08/19/2020 1722    Lab Results  Component Value Date   BACTERIA NONE SEEN 11/12/2017    Pertinent Imaging:  Results for orders placed during the hospital encounter of 09/21/05  DG Abd 1 View  Narrative Clinical Data: Evaluate for retained pancreatic duct stent. ABDOMEN - 1 VIEW - 09/21/05: (Additional 1 view abdomen obtained to include the entire abdomen). Findings: Negative for retained pancreatic duct stent. Unremarkable bowel gas pattern.  Impression No evidence of retained pancreatic duct stent.  Provider: Jennye Boroughs  No results found for this or any previous visit.  No results found for this or any previous visit.  No results found for this or any previous visit.  No results found for this or any  previous visit.  No results found for this or any previous visit.  No results found for this or any previous visit.  No results found for this or any previous visit.   Assessment & Plan:    1. Benign prostatic hyperplasia, unspecified whether lower urinary tract symptoms present -rapaflo 8mg   - Urinalysis, Routine w reflex microscopic - BLADDER SCAN AMB NON-IMAGING  2. Erectile dysfunction due to arterial insufficiency -levitera 20mg  prn  3. Nocturia -rapaflo 8mg  daily   No follow-ups on file.  Nicolette Bang, MD  [facility]

## 2021-01-14 NOTE — Progress Notes (Signed)
PVR=76  Urological Symptom Review  Patient is experiencing the following symptoms: Get up at night Weak stream  erection problems Frequent urination   Review of Systems  Gastrointestinal (upper)  : Negative for upper GI symptoms  Gastrointestinal (lower) : Negative for lower GI symptoms  Constitutional : Negative for symptoms  Skin: Negative for skin symptoms  Eyes: Negative for eye symptoms  Ear/Nose/Throat : Sinus problems  Hematologic/Lymphatic: Negative for Hematologic/Lymphatic symptoms  Cardiovascular : Negative for cardiovascular symptoms  Respiratory : Negative for respiratory symptoms  Endocrine: Negative for endocrine symptoms  Musculoskeletal: Negative for musculoskeletal symptoms  Neurological: Negative for neurological symptoms  Psychologic: Negative for psychiatric symptoms

## 2021-01-14 NOTE — Patient Instructions (Signed)

## 2021-01-25 ENCOUNTER — Other Ambulatory Visit: Payer: Self-pay | Admitting: Family Medicine

## 2021-02-01 ENCOUNTER — Ambulatory Visit: Payer: BC Managed Care – PPO | Admitting: Urology

## 2021-02-01 DIAGNOSIS — M25512 Pain in left shoulder: Secondary | ICD-10-CM | POA: Diagnosis not present

## 2021-02-01 DIAGNOSIS — M25511 Pain in right shoulder: Secondary | ICD-10-CM | POA: Diagnosis not present

## 2021-02-16 ENCOUNTER — Encounter: Payer: Self-pay | Admitting: Urology

## 2021-02-23 ENCOUNTER — Encounter: Payer: Self-pay | Admitting: Urology

## 2021-02-23 ENCOUNTER — Other Ambulatory Visit: Payer: Self-pay

## 2021-02-23 ENCOUNTER — Ambulatory Visit: Payer: BC Managed Care – PPO | Admitting: Urology

## 2021-02-23 VITALS — BP 117/78 | HR 85 | Temp 98.3°F

## 2021-02-23 DIAGNOSIS — N5201 Erectile dysfunction due to arterial insufficiency: Secondary | ICD-10-CM | POA: Diagnosis not present

## 2021-02-23 DIAGNOSIS — N400 Enlarged prostate without lower urinary tract symptoms: Secondary | ICD-10-CM | POA: Diagnosis not present

## 2021-02-23 DIAGNOSIS — R351 Nocturia: Secondary | ICD-10-CM | POA: Insufficient documentation

## 2021-02-23 LAB — URINALYSIS, ROUTINE W REFLEX MICROSCOPIC
Bilirubin, UA: NEGATIVE
Glucose, UA: NEGATIVE
Ketones, UA: NEGATIVE
Leukocytes,UA: NEGATIVE
Nitrite, UA: NEGATIVE
Protein,UA: NEGATIVE
Specific Gravity, UA: 1.02 (ref 1.005–1.030)
Urobilinogen, Ur: 0.2 mg/dL (ref 0.2–1.0)
pH, UA: 6.5 (ref 5.0–7.5)

## 2021-02-23 LAB — MICROSCOPIC EXAMINATION
Bacteria, UA: NONE SEEN
Epithelial Cells (non renal): NONE SEEN /hpf (ref 0–10)
RBC, Urine: NONE SEEN /hpf (ref 0–2)
Renal Epithel, UA: NONE SEEN /hpf
WBC, UA: NONE SEEN /hpf (ref 0–5)

## 2021-02-23 MED ORDER — TADALAFIL 5 MG PO TABS
5.0000 mg | ORAL_TABLET | Freq: Every day | ORAL | 11 refills | Status: DC
Start: 2021-02-23 — End: 2021-08-29

## 2021-02-23 NOTE — Progress Notes (Signed)
Urological Symptom Review  Patient is experiencing the following symptoms: Frequent urination Hard to postpone urination Burning/pain with urination Get up at night to urinate Penile pain (male only)    Review of Systems  Gastrointestinal (upper)  : Negative for upper GI symptoms  Gastrointestinal (lower) : Negative for lower GI symptoms  Constitutional : Negative for symptoms  Skin: Negative for skin symptoms  Eyes: Negative for eye symptoms  Ear/Nose/Throat : Sinus problems  Hematologic/Lymphatic: Negative for Hematologic/Lymphatic symptoms  Cardiovascular : Negative for cardiovascular symptoms  Respiratory : Negative for respiratory symptoms  Endocrine: Negative for endocrine symptoms  Musculoskeletal: Joint pain  Neurological: Negative for neurological symptoms  Psychologic: Negative for psychiatric symptoms

## 2021-02-23 NOTE — Progress Notes (Signed)
02/23/2021 10:45 AM   William [name] [birth year] [id number]  Referring provider: Erven Colla, DO [address],  [place] [address]  Followup erectile dysfunction and BPH  HPI: [name] is a 55yo here for followup for erectile dysfunction and BPH. He stopped rapaflo due to congestion. He restarted tadalafil 5mg  which improved his LUTS. He notes his erections are better with tadalafil than levitra. He is having pelvic pressure when he gets a strong urge to urinate.  No other complaints today.     PMH: Past Medical History:  Diagnosis Date  . COVID    10-09-2019  . Difficulty sleeping    TAKES ADVIL PM  . Diverticulitis of intestine with perforation and abscess   . Diverticulosis   . Dysuria   . ED (erectile dysfunction)   . Family history of adverse reaction to anesthesia    sister  and mom PONV  . GERD (gastroesophageal reflux disease)   . Headache   . Hyperlipidemia   . Hypertension   . Osteoarthritis   . Pancreatitis, acute   . PONV (postoperative nausea and vomiting)   . Prostatitis   . Reflux     Surgical History: Past Surgical History:  Procedure Laterality Date  . ABCESS DRAINAGE     ABDOMINAL  . APPENDECTOMY    . CHOLECYSTECTOMY    . COLONOSCOPY N/A 06/09/2015   Procedure: COLONOSCOPY;  Surgeon: Daneil Dolin, MD;  [facility];  Service: Endoscopy;  Laterality: N/A;  11:30 Am  . HERNIA REPAIR    . KNEE SURGERY     Both  . LAPAROSCOPIC LYSIS OF ADHESIONS N/A 08/19/2020   Procedure: LAPAROSCOPIC LYSIS OF ADHESIONS;  Surgeon: Armandina Gemma, MD;  Location: [facility];  Service: General;  Laterality: N/A;  . LAPAROSCOPIC SIGMOID COLECTOMY N/A 11/17/2013   Procedure: LAPAROSCOPIC SIGMOID COLECTOMY rigid proctoscopy;  Surgeon: Edward Jolly, MD;  Location: [facility];  Service: General;  Laterality: N/A;  . LAPAROSCOPY N/A 08/19/2020   Procedure: LAPAROSCOPY DIAGNOSTIC AND OPEN LEFT INGIUNAL HERNIA REPAIR WITH MESH;  Surgeon: Armandina Gemma, MD;   Location: [facility];  Service: General;  Laterality: N/A;  . SHOULDER ARTHROSCOPY     x3  . SHOULDER SURGERY Left May 2016  . SPHINCTEROTOMY     for sphincter of Oddi dysfunction  . TONSILLECTOMY      Home Medications:  Allergies as of 02/23/2021      Reactions   Augmentin [amoxicillin-pot Clavulanate] Diarrhea   Acyclovir And Related Swelling   Mouth swelling and sores   Biaxin [clarithromycin]    Headache and vomiting,sores in mouth   Flomax [tamsulosin Hcl]    Headache, dizziness   Other Nausea And Vomiting   anesthesia      Medication List       Accurate as of Feb 23, 2021 10:45 AM. If you have any questions, ask your nurse or doctor.        ALIVE MENS ENERGY PO Take 1 tablet by mouth daily.   amLODipine 10 MG tablet Commonly known as: NORVASC Take 1 tablet (10 mg total) by mouth at bedtime.   fluticasone 50 MCG/ACT nasal spray Commonly known as: FLONASE Use 2 spray(s) in each nostril once daily   hyoscyamine 0.125 MG tablet Commonly known as: Levsin Take 1 tablet (0.125 mg total) by mouth every 6 (six) hours as needed for cramping.   ibuprofen 800 MG tablet Commonly known as: ADVIL Take 800 mg by mouth every 8 (eight) hours  as needed for mild pain or moderate pain.   linaclotide 145 MCG Caps capsule Commonly known as: Linzess Take 1 capsule (145 mcg total) by mouth daily before breakfast.   lisinopril 5 MG tablet Commonly known as: ZESTRIL Take 1 tablet by mouth once daily   lovastatin 40 MG tablet Commonly known as: MEVACOR Take 1 tablet (40 mg total) by mouth at bedtime.   melatonin 3 MG Tabs tablet Take 6 mg by mouth at bedtime.   neomycin-polymyxin-hydrocortisone 3.5-10000-1 OTIC suspension Commonly known as: CORTISPORIN Place 3 drops in the affected ear four times daily What changed:   how much to take  how to take this  when to take this  reasons to take this  additional instructions   omeprazole 40 MG capsule Commonly known as:  PRILOSEC Take 1 capsule (40 mg total) by mouth daily.   ondansetron 4 MG disintegrating tablet Commonly known as: Zofran ODT Take 1 tablet (4 mg total) by mouth every 8 (eight) hours as needed for nausea or vomiting.   oxyCODONE 5 MG immediate release tablet Commonly known as: Oxy IR/ROXICODONE Take 1-2 tablets (5-10 mg total) by mouth every 6 (six) hours as needed for moderate pain.   polyethylene glycol 17 g packet Commonly known as: MIRALAX / GLYCOLAX Take 17 g by mouth 2 (two) times daily.   SAMBUCUS ELDERBERRY PO Take 1 tablet by mouth daily.   silodosin 8 MG Caps capsule Commonly known as: RAPAFLO Take 1 capsule (8 mg total) by mouth daily with breakfast.   tadalafil 5 MG tablet Commonly known as: CIALIS Take 1 tablet by mouth once daily   vardenafil 20 MG tablet Commonly known as: LEVITRA Take 1 tablet (20 mg total) by mouth daily as needed for erectile dysfunction.   zolpidem 10 MG tablet Commonly known as: AMBIEN Take 1 tablet (10 mg total) by mouth at bedtime.       Allergies:  Allergies  Allergen Reactions  . Augmentin [Amoxicillin-Pot Clavulanate] Diarrhea  . Acyclovir And Related Swelling    Mouth swelling and sores  . Biaxin [Clarithromycin]     Headache and vomiting,sores in mouth  . Flomax [Tamsulosin Hcl]     Headache, dizziness  . Other Nausea And Vomiting    anesthesia     Family History: Family History  Problem Relation Age of Onset  . Cancer Father        ? Colon mass removed   . Ulcerative colitis Sister   . Colon cancer Maternal Grandmother        23s  . Esophageal cancer Neg Hx   . Pancreatic cancer Neg Hx   . Stomach cancer Neg Hx     Social History:  reports that he quit smoking about 34 years ago. He quit after 2.00 years of use. He has never used smokeless tobacco. He reports current alcohol use of about 1.0 standard drink of alcohol per week. He reports that he does not use drugs.  ROS: All other review of systems were  reviewed and are negative except what is noted above in HPI  Physical Exam: BP 117/78   Pulse 85   Temp 98.3 F (36.8 C)   Constitutional:  Alert and oriented, No acute distress. HEENT: [place] AT, moist mucus membranes.  Trachea midline, no masses. Cardiovascular: No clubbing, cyanosis, or edema. Respiratory: Normal respiratory effort, no increased work of breathing. GI: Abdomen is soft, nontender, nondistended, no abdominal masses GU: No CVA tenderness.  Lymph: No cervical or inguinal  lymphadenopathy. Skin: No rashes, bruises or suspicious lesions. Neurologic: Grossly intact, no focal deficits, moving all 4 extremities. Psychiatric: Normal mood and affect.  Laboratory Data: Lab Results  Component Value Date   WBC 7.0 08/13/2020   HGB 14.6 08/13/2020   HCT 43.0 08/13/2020   MCV 89.2 08/13/2020   PLT 252 08/13/2020    Lab Results  Component Value Date   CREATININE 0.72 08/13/2020    Lab Results  Component Value Date   PSA 1.65 04/06/2014    No results found for: TESTOSTERONE  No results found for: HGBA1C  Urinalysis    Component Value Date/Time   COLORURINE YELLOW 08/19/2020 1722   APPEARANCEUR Clear 01/14/2021 0921   LABSPEC 1.016 08/19/2020 1722   PHURINE 6.0 08/19/2020 1722   GLUCOSEU Negative 01/14/2021 0921   HGBUR NEGATIVE 08/19/2020 1722   BILIRUBINUR Negative 01/14/2021 0921   KETONESUR NEGATIVE 08/19/2020 1722   PROTEINUR Negative 01/14/2021 0921   PROTEINUR NEGATIVE 08/19/2020 1722   UROBILINOGEN 0.2 11/27/2013 1124   NITRITE Negative 01/14/2021 0921   NITRITE NEGATIVE 08/19/2020 1722   LEUKOCYTESUR Negative 01/14/2021 0921   LEUKOCYTESUR NEGATIVE 08/19/2020 1722    Lab Results  Component Value Date   LABMICR See below: 01/14/2021   WBCUA None seen 01/14/2021   LABEPIT 0-10 01/14/2021   BACTERIA None seen 01/14/2021    Pertinent Imaging:  Results for orders placed during the hospital encounter of 09/21/05  DG Abd 1  View  Narrative Clinical Data: Evaluate for retained pancreatic duct stent. ABDOMEN - 1 VIEW - 09/21/05: (Additional 1 view abdomen obtained to include the entire abdomen). Findings: Negative for retained pancreatic duct stent. Unremarkable bowel gas pattern.  Impression No evidence of retained pancreatic duct stent.  Provider: Jennye Boroughs  No results found for this or any previous visit.  No results found for this or any previous visit.  No results found for this or any previous visit.  No results found for this or any previous visit.  No results found for this or any previous visit.  No results found for this or any previous visit.  No results found for this or any previous visit.   Assessment & Plan:    1. Benign prostatic hyperplasia, unspecified whether lower urinary tract symptoms present -continue tadalafil 5mg   - Urinalysis, Routine w reflex microscopic  2. Erectile dysfunction -Continue tadalafil 5mg  daily  3. Nocturia -tadalafil 5mg  daily   No follow-ups on file.  Nicolette Bang, MD  [facility] [place]

## 2021-03-10 DIAGNOSIS — M542 Cervicalgia: Secondary | ICD-10-CM | POA: Diagnosis not present

## 2021-03-15 ENCOUNTER — Other Ambulatory Visit: Payer: Self-pay | Admitting: Orthopaedic Surgery

## 2021-03-15 DIAGNOSIS — M25511 Pain in right shoulder: Secondary | ICD-10-CM

## 2021-03-15 DIAGNOSIS — M25512 Pain in left shoulder: Secondary | ICD-10-CM | POA: Diagnosis not present

## 2021-03-22 ENCOUNTER — Ambulatory Visit
Admission: RE | Admit: 2021-03-22 | Discharge: 2021-03-22 | Disposition: A | Discharge status: Home / Self Care | Payer: BC Managed Care – PPO | Source: Ambulatory Visit | Attending: Orthopaedic Surgery | Admitting: Orthopaedic Surgery

## 2021-03-22 DIAGNOSIS — M25511 Pain in right shoulder: Secondary | ICD-10-CM

## 2021-03-22 DIAGNOSIS — M19011 Primary osteoarthritis, right shoulder: Secondary | ICD-10-CM | POA: Diagnosis not present

## 2021-03-22 DIAGNOSIS — M25411 Effusion, right shoulder: Secondary | ICD-10-CM | POA: Diagnosis not present

## 2021-04-06 DIAGNOSIS — Z01818 Encounter for other preprocedural examination: Secondary | ICD-10-CM | POA: Diagnosis not present

## 2021-04-06 DIAGNOSIS — G4700 Insomnia, unspecified: Secondary | ICD-10-CM | POA: Diagnosis not present

## 2021-04-06 DIAGNOSIS — G8929 Other chronic pain: Secondary | ICD-10-CM | POA: Diagnosis not present

## 2021-04-06 DIAGNOSIS — M25511 Pain in right shoulder: Secondary | ICD-10-CM | POA: Diagnosis not present

## 2021-05-03 DIAGNOSIS — M25511 Pain in right shoulder: Secondary | ICD-10-CM | POA: Diagnosis not present

## 2021-05-03 DIAGNOSIS — M25512 Pain in left shoulder: Secondary | ICD-10-CM | POA: Diagnosis not present

## 2021-05-16 DIAGNOSIS — K562 Volvulus: Secondary | ICD-10-CM

## 2021-05-16 HISTORY — DX: Volvulus: K56.2

## 2021-05-19 ENCOUNTER — Other Ambulatory Visit: Payer: Self-pay | Admitting: Family Medicine

## 2021-05-23 DIAGNOSIS — M25511 Pain in right shoulder: Secondary | ICD-10-CM | POA: Diagnosis not present

## 2021-05-23 DIAGNOSIS — Z01812 Encounter for preprocedural laboratory examination: Secondary | ICD-10-CM | POA: Diagnosis not present

## 2021-05-31 DIAGNOSIS — R109 Unspecified abdominal pain: Secondary | ICD-10-CM | POA: Diagnosis not present

## 2021-05-31 DIAGNOSIS — R140 Abdominal distension (gaseous): Secondary | ICD-10-CM | POA: Diagnosis not present

## 2021-06-01 ENCOUNTER — Inpatient Hospital Stay: Payer: BC Managed Care – PPO | Admitting: Anesthesiology

## 2021-06-01 ENCOUNTER — Emergency Department: Payer: BC Managed Care – PPO

## 2021-06-01 ENCOUNTER — Encounter
Admission: EM | Disposition: A | Discharge status: Home / Self Care | Payer: Self-pay | Source: Home / Self Care | Attending: General Surgery

## 2021-06-01 ENCOUNTER — Inpatient Hospital Stay
Admission: EM | Admit: 2021-06-01 | Discharge: 2021-06-11 | DRG: 353 | Disposition: A | Discharge status: Home / Self Care | Payer: BC Managed Care – PPO | Attending: General Surgery | Admitting: General Surgery

## 2021-06-01 ENCOUNTER — Other Ambulatory Visit: Payer: Self-pay

## 2021-06-01 DIAGNOSIS — M21069 Valgus deformity, not elsewhere classified, unspecified knee: Secondary | ICD-10-CM | POA: Diagnosis not present

## 2021-06-01 DIAGNOSIS — Z883 Allergy status to other anti-infective agents status: Secondary | ICD-10-CM | POA: Diagnosis not present

## 2021-06-01 DIAGNOSIS — R109 Unspecified abdominal pain: Secondary | ICD-10-CM | POA: Diagnosis not present

## 2021-06-01 DIAGNOSIS — Z87891 Personal history of nicotine dependence: Secondary | ICD-10-CM | POA: Diagnosis not present

## 2021-06-01 DIAGNOSIS — K56609 Unspecified intestinal obstruction, unspecified as to partial versus complete obstruction: Secondary | ICD-10-CM | POA: Diagnosis not present

## 2021-06-01 DIAGNOSIS — E785 Hyperlipidemia, unspecified: Secondary | ICD-10-CM | POA: Diagnosis not present

## 2021-06-01 DIAGNOSIS — Z79899 Other long term (current) drug therapy: Secondary | ICD-10-CM

## 2021-06-01 DIAGNOSIS — R100 Acute abdomen: Secondary | ICD-10-CM | POA: Diagnosis not present

## 2021-06-01 DIAGNOSIS — Z9049 Acquired absence of other specified parts of digestive tract: Secondary | ICD-10-CM

## 2021-06-01 DIAGNOSIS — I10 Essential (primary) hypertension: Secondary | ICD-10-CM | POA: Diagnosis present

## 2021-06-01 DIAGNOSIS — Z8616 Personal history of COVID-19: Secondary | ICD-10-CM

## 2021-06-01 DIAGNOSIS — K6389 Other specified diseases of intestine: Secondary | ICD-10-CM | POA: Diagnosis not present

## 2021-06-01 DIAGNOSIS — Z9889 Other specified postprocedural states: Secondary | ICD-10-CM | POA: Diagnosis not present

## 2021-06-01 DIAGNOSIS — K9189 Other postprocedural complications and disorders of digestive system: Secondary | ICD-10-CM | POA: Diagnosis not present

## 2021-06-01 DIAGNOSIS — K460 Unspecified abdominal hernia with obstruction, without gangrene: Principal | ICD-10-CM | POA: Diagnosis present

## 2021-06-01 DIAGNOSIS — K450 Other specified abdominal hernia with obstruction, without gangrene: Secondary | ICD-10-CM | POA: Diagnosis not present

## 2021-06-01 DIAGNOSIS — K219 Gastro-esophageal reflux disease without esophagitis: Secondary | ICD-10-CM | POA: Diagnosis not present

## 2021-06-01 DIAGNOSIS — K562 Volvulus: Secondary | ICD-10-CM | POA: Diagnosis not present

## 2021-06-01 DIAGNOSIS — K567 Ileus, unspecified: Secondary | ICD-10-CM | POA: Diagnosis not present

## 2021-06-01 DIAGNOSIS — R1012 Left upper quadrant pain: Secondary | ICD-10-CM | POA: Diagnosis not present

## 2021-06-01 DIAGNOSIS — Z880 Allergy status to penicillin: Secondary | ICD-10-CM

## 2021-06-01 DIAGNOSIS — Z4682 Encounter for fitting and adjustment of non-vascular catheter: Secondary | ICD-10-CM | POA: Diagnosis not present

## 2021-06-01 DIAGNOSIS — Z20822 Contact with and (suspected) exposure to covid-19: Secondary | ICD-10-CM | POA: Diagnosis not present

## 2021-06-01 DIAGNOSIS — Z888 Allergy status to other drugs, medicaments and biological substances status: Secondary | ICD-10-CM

## 2021-06-01 DIAGNOSIS — K660 Peritoneal adhesions (postprocedural) (postinfection): Secondary | ICD-10-CM | POA: Diagnosis not present

## 2021-06-01 HISTORY — PX: LAPAROTOMY: SHX154

## 2021-06-01 LAB — COMPREHENSIVE METABOLIC PANEL
ALT: 152 U/L — ABNORMAL HIGH (ref 0–44)
AST: 135 U/L — ABNORMAL HIGH (ref 15–41)
Albumin: 4.1 g/dL (ref 3.5–5.0)
Alkaline Phosphatase: 160 U/L — ABNORMAL HIGH (ref 38–126)
Anion gap: 10 (ref 5–15)
BUN: 24 mg/dL — ABNORMAL HIGH (ref 6–20)
CO2: 23 mmol/L (ref 22–32)
Calcium: 8.6 mg/dL — ABNORMAL LOW (ref 8.9–10.3)
Chloride: 106 mmol/L (ref 98–111)
Creatinine, Ser: 0.77 mg/dL (ref 0.61–1.24)
GFR, Estimated: >60 mL/min (ref >60)
Glucose, Bld: 113 mg/dL — ABNORMAL HIGH (ref 70–99)
Potassium: 3.4 mmol/L — ABNORMAL LOW (ref 3.5–5.1)
Sodium: 139 mmol/L (ref 135–145)
Total Bilirubin: 0.6 mg/dL (ref 0.3–1.2)
Total Protein: 7.3 g/dL (ref 6.5–8.1)

## 2021-06-01 LAB — URINALYSIS, COMPLETE (UACMP) WITH MICROSCOPIC
Bacteria, UA: NONE SEEN
Bilirubin Urine: NEGATIVE
Glucose, UA: NEGATIVE mg/dL
Hgb urine dipstick: NEGATIVE
Ketones, ur: 5 mg/dL — AB
Leukocytes,Ua: NEGATIVE
Nitrite: NEGATIVE
Protein, ur: 100 mg/dL — AB
Specific Gravity, Urine: 1.034 — ABNORMAL HIGH (ref 1.005–1.030)
pH: 5 (ref 5.0–8.0)

## 2021-06-01 LAB — CBC
HCT: 43.9 % (ref 39.0–52.0)
Hemoglobin: 15.1 g/dL (ref 13.0–17.0)
MCH: 30.5 pg (ref 26.0–34.0)
MCHC: 34.4 g/dL (ref 30.0–36.0)
MCV: 88.7 fL (ref 80.0–100.0)
Platelets: 212 10*3/uL (ref 150–400)
RBC: 4.95 MIL/uL (ref 4.22–5.81)
RDW: 13.5 % (ref 11.5–15.5)
WBC: 3.4 10*3/uL — ABNORMAL LOW (ref 4.0–10.5)
nRBC: 0 % (ref 0.0–0.2)

## 2021-06-01 LAB — RESP PANEL BY RT-PCR (FLU A&B, COVID) ARPGX2
Influenza A by PCR: NEGATIVE
Influenza B by PCR: NEGATIVE
SARS Coronavirus 2 by RT PCR: NEGATIVE

## 2021-06-01 LAB — LIPASE, BLOOD: Lipase: 24 U/L (ref 11–51)

## 2021-06-01 SURGERY — LAPAROTOMY, EXPLORATORY
Anesthesia: Choice

## 2021-06-01 MED ORDER — ACETAMINOPHEN 10 MG/ML IV SOLN
INTRAVENOUS | Status: DC | PRN
  Administered 2021-06-01: 1000 mg via INTRAVENOUS

## 2021-06-01 MED ORDER — FENTANYL CITRATE (PF) 100 MCG/2ML IJ SOLN
25.0000 ug | INTRAMUSCULAR | Status: DC | PRN
  Administered 2021-06-01 (×5): 25 ug via INTRAVENOUS

## 2021-06-01 MED ORDER — FENTANYL CITRATE (PF) 100 MCG/2ML IJ SOLN
50.0000 ug | Freq: Once | INTRAMUSCULAR | Status: AC
Start: 2021-06-01 — End: 2021-06-01
  Administered 2021-06-01: 50 ug via INTRAVENOUS
  Filled 2021-06-01: qty 2

## 2021-06-01 MED ORDER — PHENYLEPHRINE HCL (PRESSORS) 10 MG/ML IV SOLN
INTRAVENOUS | Status: AC
  Filled 2021-06-01: qty 1

## 2021-06-01 MED ORDER — AMLODIPINE BESYLATE 5 MG PO TABS
10.0000 mg | ORAL_TABLET | Freq: Every day | ORAL | Status: DC
  Administered 2021-06-02 – 2021-06-10 (×9): 10 mg via ORAL
  Filled 2021-06-01 (×9): qty 2

## 2021-06-01 MED ORDER — ROCURONIUM BROMIDE 100 MG/10ML IV SOLN
INTRAVENOUS | Status: DC | PRN
  Administered 2021-06-01: 30 mg via INTRAVENOUS
  Administered 2021-06-01: 20 mg via INTRAVENOUS
  Administered 2021-06-01: 10 mg via INTRAVENOUS

## 2021-06-01 MED ORDER — EPHEDRINE SULFATE 50 MG/ML IJ SOLN
INTRAMUSCULAR | Status: DC | PRN
  Administered 2021-06-01 (×2): 10 mg via INTRAVENOUS

## 2021-06-01 MED ORDER — SODIUM CHLORIDE 0.9 % IV BOLUS
1000.0000 mL | Freq: Once | INTRAVENOUS | Status: AC
  Administered 2021-06-01: 1000 mL via INTRAVENOUS

## 2021-06-01 MED ORDER — SODIUM CHLORIDE 0.9 % IV SOLN: INTRAVENOUS | Status: DC

## 2021-06-01 MED ORDER — ZOLPIDEM TARTRATE 5 MG PO TABS
10.0000 mg | ORAL_TABLET | Freq: Every day | ORAL | Status: DC
  Administered 2021-06-07 – 2021-06-08 (×2): 10 mg via ORAL
  Filled 2021-06-01 (×5): qty 2

## 2021-06-01 MED ORDER — BUPIVACAINE-EPINEPHRINE (PF) 0.5% -1:200000 IJ SOLN
INTRAMUSCULAR | Status: DC | PRN
  Administered 2021-06-01: 30 mL

## 2021-06-01 MED ORDER — BUPIVACAINE LIPOSOME 1.3 % IJ SUSP
INTRAMUSCULAR | Status: DC | PRN
  Administered 2021-06-01: 20 mL

## 2021-06-01 MED ORDER — MORPHINE SULFATE (PF) 4 MG/ML IV SOLN
INTRAVENOUS | Status: AC
  Filled 2021-06-01: qty 1

## 2021-06-01 MED ORDER — ENOXAPARIN SODIUM 40 MG/0.4ML IJ SOSY
40.0000 mg | PREFILLED_SYRINGE | INTRAMUSCULAR | Status: DC
  Administered 2021-06-02 – 2021-06-10 (×9): 40 mg via SUBCUTANEOUS
  Filled 2021-06-01 (×10): qty 0.4

## 2021-06-01 MED ORDER — MORPHINE SULFATE (PF) 4 MG/ML IV SOLN
4.0000 mg | INTRAVENOUS | Status: DC | PRN
  Administered 2021-06-01 – 2021-06-09 (×8): 4 mg via INTRAVENOUS
  Filled 2021-06-01 (×8): qty 1

## 2021-06-01 MED ORDER — MORPHINE SULFATE (PF) 4 MG/ML IV SOLN
4.0000 mg | Freq: Once | INTRAVENOUS | Status: AC
  Administered 2021-06-01: 4 mg via INTRAVENOUS

## 2021-06-01 MED ORDER — FENTANYL CITRATE (PF) 100 MCG/2ML IJ SOLN
INTRAMUSCULAR | Status: DC | PRN
  Administered 2021-06-01 (×2): 50 ug via INTRAVENOUS

## 2021-06-01 MED ORDER — MORPHINE SULFATE (PF) 4 MG/ML IV SOLN
4.0000 mg | Freq: Once | INTRAVENOUS | Status: AC
  Administered 2021-06-01: 4 mg via INTRAVENOUS
  Filled 2021-06-01: qty 1

## 2021-06-01 MED ORDER — ONDANSETRON HCL 4 MG/2ML IJ SOLN: 4.0000 mg | Freq: Once | INTRAMUSCULAR | Status: DC | PRN

## 2021-06-01 MED ORDER — ALBUMIN HUMAN 5 % IV SOLN: INTRAVENOUS | Status: DC | PRN

## 2021-06-01 MED ORDER — ONDANSETRON HCL 4 MG/2ML IJ SOLN
INTRAMUSCULAR | Status: DC | PRN
  Administered 2021-06-01: 4 mg via INTRAVENOUS

## 2021-06-01 MED ORDER — IOHEXOL 350 MG/ML SOLN
100.0000 mL | Freq: Once | INTRAVENOUS | Status: AC | PRN
  Administered 2021-06-01: 100 mL via INTRAVENOUS
  Filled 2021-06-01: qty 100

## 2021-06-01 MED ORDER — SUCCINYLCHOLINE CHLORIDE 200 MG/10ML IV SOSY
PREFILLED_SYRINGE | INTRAVENOUS | Status: DC | PRN
  Administered 2021-06-01: 140 mg via INTRAVENOUS

## 2021-06-01 MED ORDER — MIDAZOLAM HCL 2 MG/2ML IJ SOLN
INTRAMUSCULAR | Status: AC
  Filled 2021-06-01: qty 2

## 2021-06-01 MED ORDER — FENTANYL CITRATE (PF) 100 MCG/2ML IJ SOLN
INTRAMUSCULAR | Status: AC
  Filled 2021-06-01: qty 2

## 2021-06-01 MED ORDER — ONDANSETRON HCL 4 MG/2ML IJ SOLN
4.0000 mg | INTRAMUSCULAR | Status: AC
  Administered 2021-06-01: 4 mg via INTRAVENOUS
  Filled 2021-06-01: qty 2

## 2021-06-01 MED ORDER — SCOPOLAMINE 1 MG/3DAYS TD PT72
1.0000 | MEDICATED_PATCH | TRANSDERMAL | Status: DC
  Administered 2021-06-01 – 2021-06-10 (×4): 1.5 mg via TRANSDERMAL
  Filled 2021-06-01 (×4): qty 1

## 2021-06-01 MED ORDER — ALBUMIN HUMAN 5 % IV SOLN
INTRAVENOUS | Status: AC
  Filled 2021-06-01: qty 500

## 2021-06-01 MED ORDER — DEXAMETHASONE SODIUM PHOSPHATE 10 MG/ML IJ SOLN
INTRAMUSCULAR | Status: DC | PRN
  Administered 2021-06-01: 10 mg via INTRAVENOUS

## 2021-06-01 MED ORDER — SUGAMMADEX SODIUM 500 MG/5ML IV SOLN
INTRAVENOUS | Status: DC | PRN
  Administered 2021-06-01: 200 mg via INTRAVENOUS

## 2021-06-01 MED ORDER — PROPOFOL 10 MG/ML IV BOLUS
INTRAVENOUS | Status: DC | PRN
  Administered 2021-06-01: 200 mg via INTRAVENOUS

## 2021-06-01 MED ORDER — PHENYLEPHRINE HCL (PRESSORS) 10 MG/ML IV SOLN
INTRAVENOUS | Status: DC | PRN
  Administered 2021-06-01: 100 ug via INTRAVENOUS

## 2021-06-01 MED ORDER — PIPERACILLIN-TAZOBACTAM 3.375 G IVPB
3.3750 g | Freq: Three times a day (TID) | INTRAVENOUS | Status: DC
  Administered 2021-06-01 – 2021-06-08 (×22): 3.375 g via INTRAVENOUS
  Filled 2021-06-01 (×21): qty 50

## 2021-06-01 MED ORDER — MIDAZOLAM HCL 2 MG/2ML IJ SOLN
INTRAMUSCULAR | Status: DC | PRN
  Administered 2021-06-01: 2 mg via INTRAVENOUS

## 2021-06-01 MED ORDER — FENTANYL CITRATE (PF) 100 MCG/2ML IJ SOLN
INTRAMUSCULAR | Status: AC
  Administered 2021-06-01: 25 ug via INTRAVENOUS
  Filled 2021-06-01: qty 2

## 2021-06-01 MED ORDER — ACETAMINOPHEN 650 MG RE SUPP: 650.0000 mg | Freq: Four times a day (QID) | RECTAL | Status: DC | PRN

## 2021-06-01 MED ORDER — ACETAMINOPHEN 325 MG PO TABS
650.0000 mg | ORAL_TABLET | Freq: Four times a day (QID) | ORAL | Status: DC | PRN
  Administered 2021-06-10 – 2021-06-11 (×3): 650 mg via ORAL
  Filled 2021-06-01 (×3): qty 2

## 2021-06-01 MED ORDER — LACTATED RINGERS IV SOLN: INTRAVENOUS | Status: DC | PRN

## 2021-06-01 MED ORDER — 0.9 % SODIUM CHLORIDE (POUR BTL) OPTIME
TOPICAL | Status: DC | PRN
  Administered 2021-06-01: 1000 mL

## 2021-06-01 MED ORDER — ACETAMINOPHEN 10 MG/ML IV SOLN
INTRAVENOUS | Status: AC
  Filled 2021-06-01: qty 100

## 2021-06-01 MED ORDER — PROPOFOL 10 MG/ML IV BOLUS
INTRAVENOUS | Status: AC
  Filled 2021-06-01: qty 20

## 2021-06-01 MED ORDER — SODIUM CHLORIDE 0.9 % IV SOLN
INTRAVENOUS | Status: DC | PRN
  Administered 2021-06-01: 20 ug/min via INTRAVENOUS

## 2021-06-01 MED ORDER — HYDROCODONE-ACETAMINOPHEN 5-325 MG PO TABS
1.0000 | ORAL_TABLET | ORAL | Status: DC | PRN
  Administered 2021-06-02 – 2021-06-08 (×2): 1 via ORAL
  Administered 2021-06-09: 2 via ORAL
  Administered 2021-06-10: 1 via ORAL
  Administered 2021-06-10 (×4): 2 via ORAL
  Filled 2021-06-01 (×3): qty 2
  Filled 2021-06-01 (×3): qty 1
  Filled 2021-06-01: qty 2
  Filled 2021-06-01: qty 1
  Filled 2021-06-01 (×2): qty 2

## 2021-06-01 SURGICAL SUPPLY — 36 items
APL PRP STRL LF DISP 70% ISPRP (MISCELLANEOUS) ×1
BLADE BOVIE TIP EXT 4 (BLADE) ×2 IMPLANT
CANISTER SUCT 1200ML W/VALVE (MISCELLANEOUS) ×2 IMPLANT
CHLORAPREP W/TINT 26 (MISCELLANEOUS) ×2 IMPLANT
DRAPE LAPAROTOMY 100X77 ABD (DRAPES) ×2 IMPLANT
DRSG OPSITE POSTOP 4X10 (GAUZE/BANDAGES/DRESSINGS) IMPLANT
DRSG OPSITE POSTOP 4X8 (GAUZE/BANDAGES/DRESSINGS) IMPLANT
DRSG TEGADERM 4X10 (GAUZE/BANDAGES/DRESSINGS) IMPLANT
DRSG TELFA 3X8 NADH (GAUZE/BANDAGES/DRESSINGS) IMPLANT
ELECT REM PT RETURN 9FT ADLT (ELECTROSURGICAL) ×2
ELECTRODE REM PT RTRN 9FT ADLT (ELECTROSURGICAL) ×1 IMPLANT
GAUZE 4X4 16PLY ~~LOC~~+RFID DBL (SPONGE) ×2 IMPLANT
GLOVE SURG ENC MOIS LTX SZ6.5 (GLOVE) ×6 IMPLANT
GLOVE SURG UNDER POLY LF SZ6.5 (GLOVE) ×6 IMPLANT
GOWN STRL REUS W/ TWL LRG LVL3 (GOWN DISPOSABLE) ×3 IMPLANT
GOWN STRL REUS W/TWL LRG LVL3 (GOWN DISPOSABLE) ×6
KIT TURNOVER KIT A (KITS) ×2 IMPLANT
LABEL OR SOLS (LABEL) ×2 IMPLANT
LIGASURE IMPACT 36 18CM CVD LR (INSTRUMENTS) ×2 IMPLANT
MANIFOLD NEPTUNE II (INSTRUMENTS) ×2 IMPLANT
NS IRRIG 1000ML POUR BTL (IV SOLUTION) ×2 IMPLANT
PACK BASIN MAJOR ARMC (MISCELLANEOUS) ×2 IMPLANT
PACK COLON CLEAN CLOSURE (MISCELLANEOUS) IMPLANT
RETRACTOR WND ALEXIS-O 25 LRG (MISCELLANEOUS) ×1 IMPLANT
RTRCTR WOUND ALEXIS O 25CM LRG (MISCELLANEOUS) ×2
SPONGE T-LAP 18X18 ~~LOC~~+RFID (SPONGE) ×4 IMPLANT
STAPLER SKIN PROX 35W (STAPLE) ×2 IMPLANT
SUT PDS AB 0 CT1 27 (SUTURE) ×4 IMPLANT
SUT PDS AB 1 TP1 54 (SUTURE) IMPLANT
SUT SILK 2 0 (SUTURE) ×2
SUT SILK 2-0 18XBRD TIE 12 (SUTURE) ×1 IMPLANT
SUT SILK 3 0 (SUTURE) ×2
SUT SILK 3-0 18XBRD TIE 12 (SUTURE) ×1 IMPLANT
SUT VIC AB 3-0 SH 27 (SUTURE) ×4
SUT VIC AB 3-0 SH 27X BRD (SUTURE) ×2 IMPLANT
TRAY FOLEY MTR SLVR 16FR STAT (SET/KITS/TRAYS/PACK) ×2 IMPLANT

## 2021-06-01 NOTE — Transfer of Care (Signed)
Immediate Anesthesia Transfer of Care Note  Patient: William [name]  Procedure(s) Performed: EXPLORATORY LAPAROTOMY  Patient Location: PACU  Anesthesia Type:General  Level of Consciousness: oriented and patient cooperative  Airway & Oxygen Therapy: Patient Spontanous Breathing and Patient connected to face mask oxygen  Post-op Assessment: Report given to RN and Post -op Vital signs reviewed and stable  Post vital signs: Reviewed and stable  Last Vitals:  Vitals Value Taken Time  BP 133/90 06/01/21 2100  Temp 36 C 06/01/21 2100  Pulse 98 06/01/21 2102  Resp 15 06/01/21 2102  SpO2 100 % 06/01/21 2102  Vitals shown include unvalidated device data.  Last Pain:  Vitals:   06/01/21 2100  TempSrc:   PainSc: 7          Complications: No notable events documented.

## 2021-06-01 NOTE — Anesthesia Preprocedure Evaluation (Addendum)
Anesthesia Evaluation  Patient identified by MRN, date of birth, ID band Patient awake    Reviewed: Allergy & Precautions, H&P , NPO status , Patient's Chart, lab work & pertinent test results, reviewed documented beta blocker date and time   History of Anesthesia Complications (+) PONV, Family history of anesthesia reaction and history of anesthetic complications  Airway Mallampati: III  TM Distance: >3 FB Neck ROM: full    Dental  (+) Teeth Intact   Pulmonary neg pulmonary ROS, former smoker,    Pulmonary exam normal        Cardiovascular Exercise Tolerance: Good hypertension, On Medications negative cardio ROS Normal cardiovascular exam Rhythm:regular Rate:Normal     Neuro/Psych  Headaches, PSYCHIATRIC DISORDERS Anxiety Depression negative neurological ROS  negative psych ROS   GI/Hepatic negative GI ROS, Neg liver ROS, GERD  Medicated,  Endo/Other  negative endocrine ROS  Renal/GU negative Renal ROS  negative genitourinary   Musculoskeletal   Abdominal   Peds  Hematology negative hematology ROS (+)   Anesthesia Other Findings Past Medical History: No date: COVID     Comment:  10-09-2019 No date: Difficulty sleeping     Comment:  TAKES ADVIL PM No date: Diverticulitis of intestine with perforation and abscess No date: Diverticulosis No date: Dysuria No date: ED (erectile dysfunction) No date: Family history of adverse reaction to anesthesia     Comment:  sister  and mom PONV No date: GERD (gastroesophageal reflux disease) No date: Headache No date: Hyperlipidemia No date: Hypertension No date: Osteoarthritis No date: Pancreatitis, acute No date: PONV (postoperative nausea and vomiting) No date: Prostatitis No date: Reflux Past Surgical History: No date: ABCESS DRAINAGE     Comment:  ABDOMINAL No date: APPENDECTOMY No date: CHOLECYSTECTOMY 06/09/2015: COLONOSCOPY; N/A     Comment:  Procedure:  COLONOSCOPY;  Surgeon: Daneil Dolin, MD;                [facility];  Service: Endoscopy;                Laterality: N/A;  11:30 Am No date: HERNIA REPAIR No date: KNEE SURGERY     Comment:  Both 08/19/2020: LAPAROSCOPIC LYSIS OF ADHESIONS; N/A     Comment:  Procedure: LAPAROSCOPIC LYSIS OF ADHESIONS;  Surgeon:               Armandina Gemma, MD;  Location: [facility];  Service: General;                Laterality: N/A; 11/17/2013: LAPAROSCOPIC SIGMOID COLECTOMY; N/A     Comment:  Procedure: LAPAROSCOPIC SIGMOID COLECTOMY rigid               proctoscopy;  Surgeon: Edward Jolly, MD;                Location: [facility];  Service: General;  Laterality: N/A; 08/19/2020: LAPAROSCOPY; N/A     Comment:  Procedure: LAPAROSCOPY DIAGNOSTIC AND OPEN LEFT INGIUNAL              HERNIA REPAIR WITH MESH;  Surgeon: Armandina Gemma, MD;                Location: [facility];  Service: General;  Laterality: N/A; No date: SHOULDER ARTHROSCOPY     Comment:  x3 May 2016: SHOULDER SURGERY; Left No date: SPHINCTEROTOMY     Comment:  for sphincter of Oddi dysfunction No date: TONSILLECTOMY BMI    Body Mass Index: 26.65 kg/m  Reproductive/Obstetrics negative OB ROS                             Anesthesia Physical Anesthesia Plan  ASA: 2  Anesthesia Plan: General ETT   Post-op Pain Management:    Induction:   PONV Risk Score and Plan: 4 or greater  Airway Management Planned: Oral ETT and Video Laryngoscope Planned  Additional Equipment:   Intra-op Plan:   Post-operative Plan:   Informed Consent: I have reviewed the patients History and Physical, chart, labs and discussed the procedure including the risks, benefits and alternatives for the proposed anesthesia with the patient or authorized representative who has indicated his/her understanding and acceptance.     Dental Advisory Given  Plan Discussed with: CRNA  Anesthesia Plan Comments:        Anesthesia  Quick Evaluation

## 2021-06-01 NOTE — ED Provider Notes (Signed)
[facility] Emergency Department Provider Note   ____________________________________________   Event Date/Time   First MD Initiated Contact with Patient 06/01/21 1424     (approximate)  I have reviewed the triage vital signs and the nursing notes.   HISTORY  Chief Complaint Abdominal Pain    HPI Kadesh Sullo is a 55 y.o. male history of diverticulitis previous colon surgery, and history listed below  Patient over the last 5 or so days has developed increasing abdominal pain.  Located mostly in his mid upper abdomen.  He had several loose stools over the last couple of days, the end of last night developed episodes of vomiting.  No black or bloody stool or emesis.  No fevers.  No chest pain or trouble breathing  Reports moderate to severe abdominal pain mostly across the upper abdomen and left upper quadrant left side of the abdomen.  Saw PCP yesterday, has been taking Zofran   Past Medical History:  Diagnosis Date   COVID    10-09-2019   Difficulty sleeping    TAKES ADVIL PM   Diverticulitis of intestine with perforation and abscess    Diverticulosis    Dysuria    ED (erectile dysfunction)    Family history of adverse reaction to anesthesia    sister  and mom PONV   GERD (gastroesophageal reflux disease)    Headache    Hyperlipidemia    Hypertension    Osteoarthritis    Pancreatitis, acute    PONV (postoperative nausea and vomiting)    Prostatitis    Reflux     Patient Active Problem List   Diagnosis Date Noted   Erectile dysfunction due to arterial insufficiency 02/23/2021   Nocturia 02/23/2021   Left lower quadrant abdominal pain 08/19/2020   Abdominal pain, left lower quadrant 08/15/2020   History of COVID-19 04/14/2020   Hypertension 03/13/2017   Chronic pain of both shoulders 09/13/2016   Benign prostatic hyperplasia 04/21/2016   Depression 07/22/2015   Generalized anxiety disorder 07/22/2015   Special screening for  malignant neoplasms, colon    Insomnia 05/18/2015   Hyperlipidemia LDL goal <130 12/08/2014   Postoperative abdominal pain 11/22/2013   Diverticulitis of sigmoid colon 11/17/2013   Intra-abdominal abscess ([place]) 10/08/2013   Diverticulitis with perforation 10/08/2013   Diverticulitis of colon with perforation 09/22/2013   Other and unspecified hyperlipidemia 01/09/2013   Gastro-esophageal reflux 01/09/2013   Epididymitis 01/09/2013    Past Surgical History:  Procedure Laterality Date   ABCESS DRAINAGE     ABDOMINAL   APPENDECTOMY     CHOLECYSTECTOMY     COLONOSCOPY N/A 06/09/2015   Procedure: COLONOSCOPY;  Surgeon: Daneil Dolin, MD;  [facility];  Service: Endoscopy;  Laterality: N/A;  11:30 Am   HERNIA REPAIR     KNEE SURGERY     Both   LAPAROSCOPIC LYSIS OF ADHESIONS N/A 08/19/2020   Procedure: LAPAROSCOPIC LYSIS OF ADHESIONS;  Surgeon: Armandina Gemma, MD;  Location: [facility];  Service: General;  Laterality: N/A;   LAPAROSCOPIC SIGMOID COLECTOMY N/A 11/17/2013   Procedure: LAPAROSCOPIC SIGMOID COLECTOMY rigid proctoscopy;  Surgeon: Edward Jolly, MD;  Location: [facility];  Service: General;  Laterality: N/A;   LAPAROSCOPY N/A 08/19/2020   Procedure: LAPAROSCOPY DIAGNOSTIC AND OPEN LEFT INGIUNAL HERNIA REPAIR WITH MESH;  Surgeon: Armandina Gemma, MD;  Location: [facility];  Service: General;  Laterality: N/A;   SHOULDER ARTHROSCOPY     x3   SHOULDER SURGERY Left May 2016  SPHINCTEROTOMY     for sphincter of Oddi dysfunction   TONSILLECTOMY      Prior to Admission medications   Medication Sig Start Date End Date Taking? Authorizing Provider  amLODipine (NORVASC) 10 MG tablet Take 1 tablet (10 mg total) by mouth at bedtime. 08/10/20   Lovena Le, Malena M, DO  Black Elderberry (SAMBUCUS ELDERBERRY PO) Take 1 tablet by mouth daily.    [provider]  fluticasone Asencion Islam) 50 MCG/ACT nasal spray Use 2 spray(s) in each nostril once daily 05/19/21   Lovena Le, Malena M, DO   hyoscyamine (LEVSIN) 0.125 MG tablet Take 1 tablet (0.125 mg total) by mouth every 6 (six) hours as needed for cramping. Patient not taking: Reported on 02/23/2021 07/13/20   Jerene Bears, MD  ibuprofen (ADVIL) 800 MG tablet Take 800 mg by mouth every 8 (eight) hours as needed for mild pain or moderate pain.    [provider]  linaclotide Rolan Lipa) 145 MCG CAPS capsule Take 1 capsule (145 mcg total) by mouth daily before breakfast. Patient not taking: No sig reported 06/28/20   Pyrtle, Lajuan Lines, MD  lisinopril (ZESTRIL) 5 MG tablet Take 1 tablet by mouth once daily Patient not taking: Reported on 02/23/2021 09/13/20   Elvia Collum M, DO  lovastatin (MEVACOR) 40 MG tablet Take 1 tablet (40 mg total) by mouth at bedtime. 07/29/20   Lovena Le, Malena M, DO  melatonin 3 MG TABS tablet Take 6 mg by mouth at bedtime. Patient not taking: Reported on 02/23/2021    [provider]  Multiple Vitamins-Minerals (ALIVE MENS ENERGY PO) Take 1 tablet by mouth daily.    [provider]  neomycin-polymyxin-hydrocortisone (CORTISPORIN) 3.5-10000-1 OTIC suspension Place 3 drops in the affected ear four times daily Patient taking differently: Place 3 drops into both ears daily as needed (When goes in to the pool). 04/14/20   Lovena Le, Malena M, DO  omeprazole (PRILOSEC) 40 MG capsule Take 1 capsule (40 mg total) by mouth daily. 07/12/20   Lovena Le, Malena M, DO  ondansetron (ZOFRAN ODT) 4 MG disintegrating tablet Take 1 tablet (4 mg total) by mouth every 8 (eight) hours as needed for nausea or vomiting. Patient not taking: Reported on 02/23/2021 06/28/20   Pyrtle, Lajuan Lines, MD  oxyCODONE (OXY IR/ROXICODONE) 5 MG immediate release tablet Take 1-2 tablets (5-10 mg total) by mouth every 6 (six) hours as needed for moderate pain. Patient not taking: Reported on 02/23/2021 08/20/20   Armandina Gemma, MD  polyethylene glycol (MIRALAX / GLYCOLAX) 17 g packet Take 17 g by mouth 2 (two) times daily. Patient not taking:  Reported on 02/23/2021    [provider]  silodosin (RAPAFLO) 8 MG CAPS capsule Take 1 capsule (8 mg total) by mouth daily with breakfast. Patient not taking: Reported on 02/23/2021 01/14/21   Cleon Gustin, MD  tadalafil (CIALIS) 5 MG tablet Take 1 tablet (5 mg total) by mouth daily. 02/23/21   McKenzie, Candee Furbish, MD  vardenafil (LEVITRA) 20 MG tablet Take 1 tablet (20 mg total) by mouth daily as needed for erectile dysfunction. 01/14/21   McKenzie, Candee Furbish, MD  zolpidem (AMBIEN) 10 MG tablet Take 1 tablet (10 mg total) by mouth at bedtime. 07/26/20   Erven Colla, DO    Allergies Augmentin [amoxicillin-pot clavulanate], Acyclovir and related, Biaxin [clarithromycin], Flomax [tamsulosin hcl], and Other  Family History  Problem Relation Age of Onset   Cancer Father        ? Colon mass removed  Ulcerative colitis Sister    Colon cancer Maternal Grandmother        74s   Esophageal cancer Neg Hx    Pancreatic cancer Neg Hx    Stomach cancer Neg Hx     Social History Social History   Tobacco Use   Smoking status: Former    Years: 2.00    Types: Cigarettes    Quit date: 11/10/1986    Years since quitting: 34.5   Smokeless tobacco: Never  Vaping Use   Vaping Use: Never used  Substance Use Topics   Alcohol use: Yes    Alcohol/week: 1.0 standard drink    Types: 1 Glasses of wine per week    Comment: 2-3 glasses a week.    Drug use: No    Review of Systems Constitutional: No fever/chills Eyes: No visual changes. ENT: No sore throat. Cardiovascular: Denies chest pain. Respiratory: Denies shortness of breath. Gastrointestinal: See HPI.  Not felt like eating anything today Genitourinary: Negative for dysuria. Musculoskeletal: Negative for back pain. Skin: Negative for rash. Neurological: Negative for headaches, areas of focal weakness or numbness.    ____________________________________________   PHYSICAL EXAM:  VITAL SIGNS: ED Triage Vitals  Enc  Vitals Group     BP 06/01/21 1218 117/88     Pulse Rate 06/01/21 1218 (!) 102     Resp 06/01/21 1218 18     Temp 06/01/21 1218 98.4 F (36.9 C)     Temp Source 06/01/21 1218 Oral     SpO2 06/01/21 1218 97 %     Weight 06/01/21 1220 202 lb (91.6 kg)     Height 06/01/21 1220 '6\' 1"'$  (1.854 m)     Head Circumference --      Peak Flow --      Pain Score 06/01/21 1220 5     Pain Loc --      Pain Edu? --      Excl. in [place]? --     Constitutional: Alert and oriented. Well appearing and in no acute distress but does appear in some pain holding his abdomen. Eyes: Conjunctivae are normal. Head: Atraumatic. Nose: No congestion/rhinnorhea. Mouth/Throat: Mucous membranes are slightly dry. Neck: No stridor.  Cardiovascular: Normal rate, regular rhythm. Grossly normal heart sounds.  Good peripheral circulation. Respiratory: Normal respiratory effort.  No retractions. Lungs CTAB. Gastrointestinal: Soft and somewhat distended, a little tympanitic.  Notably tender to palpation of the left side of the abdomen including left mid to left upper quadrant with positive evidence of rebound tenderness in this region as well Musculoskeletal: No lower extremity tenderness nor edema. Neurologic:  Normal speech and language. No gross focal neurologic deficits are appreciated.  Skin:  Skin is warm, dry and intact. No rash noted. Psychiatric: Mood and affect are normal. Speech and behavior are normal.  ____________________________________________   LABS (all labs ordered are listed, but only abnormal results are displayed)  Labs Reviewed  COMPREHENSIVE METABOLIC PANEL - Abnormal; Notable for the following components:      Result Value   Potassium 3.4 (*)    Glucose, Bld 113 (*)    BUN 24 (*)    Calcium 8.6 (*)    AST 135 (*)    ALT 152 (*)    Alkaline Phosphatase 160 (*)    All other components within normal limits  CBC - Abnormal; Notable for the following components:   WBC 3.4 (*)    All other  components within normal limits  URINALYSIS, COMPLETE (UACMP) WITH  MICROSCOPIC - Abnormal; Notable for the following components:   Color, Urine YELLOW (*)    APPearance CLEAR (*)    Specific Gravity, Urine 1.034 (*)    Ketones, ur 5 (*)    Protein, ur 100 (*)    All other components within normal limits  RESP PANEL BY RT-PCR (FLU A&B, COVID) ARPGX2  LIPASE, BLOOD   ____________________________________________  RADIOLOGY  CT ABDOMEN PELVIS W CONTRAST  Result Date: 06/01/2021 CLINICAL DATA:  Epigastric pain.  Abdominal pain and distention. EXAM: CT ABDOMEN AND PELVIS WITH CONTRAST TECHNIQUE: Multidetector CT imaging of the abdomen and pelvis was performed using the standard protocol following bolus administration of intravenous contrast. CONTRAST:  151m OMNIPAQUE IOHEXOL 350 MG/ML SOLN COMPARISON:  01/15/2020 FINDINGS: Lower chest: Descending thoracic aortic atherosclerotic calcification. Hepatobiliary: Cholecystectomy.  Otherwise unremarkable. Pancreas: Unremarkable Spleen: Unremarkable Adrenals/Urinary Tract: Unremarkable Stomach/Bowel: Substantial abnormal anterior gastric wall thickening for example on image 4 series 7, with single wall thickness up to 2.2 cm in diameter as compared to posterior wall thickness of only about 0.4 cm. This area of gastric wall thickening extends over an approximately 18 cm excursion and could represent tumor or unusual inflammation. Scattered loops of dilated small bowel up to about 4.2 cm in diameter, with dilated loops separated by nondilated segments potentially demonstrating wall thickening and accentuated mucosal enhancement and with some associated faint mesenteric stranding as on image 61 of series 6 and at the transition in the right anterior abdominal bowel on image 55 series 2. Other areas of nondistended and potentially inflamed small bowel separating mildly dilated loops identified. The distal most ileum is not dilated although there are some scattered  air-fluid levels within it. Possible fold thickening in loops of jejunum for example in the left lower quadrant on image 39 series 5. I note more pronounced swirling of mesenteric vasculature around the distal ileum for example on images 50 through 70 of series 5, compared to the 01/15/2020 exam. No pneumatosis is identified. There is a sigmoid colon anastomosis, an unusual course of the colon with the transverse colon extending posterior to the small bowel loops in the mid abdomen at about the level of the aortic bifurcation as shown on image 60 of series 2. Presumably this represents postoperative mobilization of the colon, and this configuration is unchanged from 01/15/2020. There are couple of diverticula at the junction of the descending and sigmoid colon. Vascular/Lymphatic: Mild abdominal aortic atherosclerotic vascular calcification. Reproductive: Mild prostatomegaly. Other: Trace free pelvic fluid just above the urinary bladder. Musculoskeletal: Thoracolumbar spondylosis. Left foraminal impingement at L5-S1. IMPRESSION: 1. Striking wall thickening anteriorly in the gastric body and antrum. Although possibly inflammatory, I cannot exclude tumor such as gastric adenocarcinoma or lymphoma. Endoscopy should be considered in the appropriate setting. 2. Scattered loops of mildly dilated small bowel separated by nondilated but potentially inflamed loops of small bowel. In combination with the increased swirling of mesenteric vasculature around the distal ileum shown on images 50 through 70 of series 5, possibility of a low-grade mesenteric volvulus is raised. No pneumatosis is identified. 3. Shortened colon, with the transverse colon extending posterior to small bowel loops at about the level of the bifurcation, and anastomotic staple line in the sigmoid colon, as on the prior exam. 4. Other imaging findings of potential clinical significance: Aortic Atherosclerosis (ICD10-I70.0). Mild prostatomegaly. Left  foraminal impingement at L5-S1. Electronically Signed   By: WVan ClinesM.D.   On: 06/01/2021 16:44    Results discussed with Dr. CPeyton Najjar____________________________________________  PROCEDURES  Procedure(s) performed: None  Procedures  Critical Care performed: No  ____________________________________________   INITIAL IMPRESSION / ASSESSMENT AND PLAN / ED COURSE  Pertinent labs & imaging results that were available during my care of the patient were reviewed by me and considered in my medical decision making (see chart for details).   Differential diagnosis includes but is not limited to, abdominal perforation, aortic dissection, cholecystitis, appendicitis, diverticulitis, colitis, esophagitis/gastritis, kidney stone, pyelonephritis, urinary tract infection, aortic aneurysm. All are considered in decision and treatment plan. Based upon the patient's presentation and risk factors, we will proceed by obtaining CT imaging.  Hydrate provide antiemetic pain control.  High suspicion for acute intra-abdominal process   Clinical Course as of 06/01/21 1727  Wed Jun 01, 2021  1707 Patient alert, remains in pain.  Previously fentanyl has worked well for him, have ordered fentanyl for pain.  Discussed imaging and have placed consult with Dr. Peyton Najjar who will see and evaluate the patient shortly [MQ]    Clinical Course User Index [MQ] Delman Kitten, MD   ----------------------------------------- 5:26 PM on 06/01/2021 ----------------------------------------- Patient has been seen by general surgery and admitted to the general surgical service Dr. Peyton Najjar.  Patient alert well oriented at this time appropriate for admission  Exact intra-abdominal abnormality at this time is somewhat unclear, but volvulus, obstruction, or other acute intra-abdominal catastrophe remains high in the differential. ____________________________________________   FINAL CLINICAL IMPRESSION(S) / ED  DIAGNOSES  Final diagnoses:  Acute abdomen        Note:  This document was prepared using Dragon voice recognition software and may include unintentional dictation errors       Delman Kitten, MD 06/01/21 1727

## 2021-06-01 NOTE — ED Notes (Signed)
Patient transported to CT 

## 2021-06-01 NOTE — Anesthesia Procedure Notes (Signed)
Procedure Name: Intubation Date/Time: 06/01/2021 6:44 PM Performed by: Lendon Colonel, CRNA Pre-anesthesia Checklist: Patient identified, Patient being monitored, Timeout performed, Emergency Drugs available and Suction available Patient Re-evaluated:Patient Re-evaluated prior to induction Oxygen Delivery Method: Circle system utilized Preoxygenation: Pre-oxygenation with 100% oxygen Induction Type: IV induction and Rapid sequence Laryngoscope Size: 3 and McGraph Grade View: Grade I Tube type: Oral Tube size: 7.5 mm Number of attempts: 1 Airway Equipment and Method: Stylet Placement Confirmation: ETT inserted through vocal cords under direct vision, positive ETCO2 and breath sounds checked- equal and bilateral Secured at: 21 cm Tube secured with: Tape Dental Injury: Teeth and Oropharynx as per pre-operative assessment

## 2021-06-01 NOTE — ED Triage Notes (Signed)
Pt comes into the ED via POV c/o abd pain that is generalized as well as abdominal distention.  H/o diverticulitis with multiple surgeries.  Pt also has N/V/D.  Pt ambulatory to triage at this time.

## 2021-06-01 NOTE — Op Note (Signed)
Preoperative diagnosis: Closed-loop small bowel obstruction  Postoperative diagnosis: Closed-loop small bowel obstruction due to internal hernia  Procedure: Exploratory laparotomy with repair of internal hernia  Anesthesia: GETA  Surgeon: Dr. Windell Moment, MD  Wound Classification: Clean Contaminated  Indications: Patient is a 55 y.o. male with signs and symptoms of abdominal pain and a preoperative diagnosis of closed-loop small bowel obstruction.  Emergent surgical management was indicated to rule out bowel ischemia  Findings: 1.  Internal hernia from transverse colon mesentery defect 2.  No bowel ischemia identified  Description of procedure: The patient was placed in the supine position and general endotracheal anesthesia was induced. A timeout was completed verifying correct patient, procedure, site, positioning, and implant(s) and/or special equipment prior to beginning this procedure. Preoperative antibiotics were given. A Foley catheter and nasogastric tube were placed. The abdomen was prepped and draped in the usual sterile fashion. After a timeout was performed, a vertical midline incision was made from xiphoid to just below the umbilicus. This was deepened through the subcutaneous tissues and hemostasis was achieved with electrocautery. The linea alba was identified and incised and the peritoneal cavity entered. The abdomen was explored and small bowel was found to be dilated.  Multiple adhesions from the omentum to the pelvis were lysed.  Then the small bowel was able to be expected from elastic above to Treitz.  Initially this was a very complicated because the orientation of the small bowel was very difficult to follow.  Finally I was able to identify that the small bowel was going to a transverse colon mesentery defect.  I was able to reduce the small bowel to the other side of the transverse colon and finally the small bowel seems to be in a comfortable position.  With a 3-0 Vicryl  I reduce the defect of the transverse colon mesentery elevating the first portion of the jejunum will go through the mesentery comfortably.  And then the small bowel was inspected from ileocecal valve to Treitz and now the small bowel seem to be comfortable.  The transverse colon goes into more neutral position as well.  After the sponge needle and instrument count was correct, the fascia was closed with a running suture of  PDS 0. The skin was closed with skin staples. The patient tolerated the procedure well and was taken to the postanesthesia care unit in stable condition.   Specimen: None  Complications: None  Estimated Blood Loss: 100 mL

## 2021-06-01 NOTE — H&P (Signed)
SURGICAL HISTORY AND PHYSICAL  NOTE   HISTORY OF PRESENT ILLNESS (HPI):  55 y.o. male presented to [facility] ED for evaluation of abdominal pain since today. Patient reports he has severe abdominal pain that has been intensifying since he was here.  Patient has been receiving morphine without improvement and actually worsening of the pain.  Pain is generalized in the abdomen.  There has been no alleviating or aggravating factors.  Patient denies any nausea or vomiting.  Patient endorses having diarrhea.  Patient has previous history of sigmoid colectomy due to complicated diverticulitis.  Patient also had open appendectomy.  Patient had laparoscopic inguinal hernia repair.  At the ED he was found with severe abdominal pain with physical exam.  CT scan of abdomen and pelvis shows multiple small bowel lobes dilated with proximal and distal point of bowel collapse concerning of volvulus/closed-loop obstruction.  No leukocytosis.  I personally evaluated the images.  Surgery is consulted by Dr. Jacqualine Code in this context for evaluation and management of intestinal volvulus.  PAST MEDICAL HISTORY (PMH):  Past Medical History:  Diagnosis Date   COVID    10-09-2019   Difficulty sleeping    TAKES ADVIL PM   Diverticulitis of intestine with perforation and abscess    Diverticulosis    Dysuria    ED (erectile dysfunction)    Family history of adverse reaction to anesthesia    sister  and mom PONV   GERD (gastroesophageal reflux disease)    Headache    Hyperlipidemia    Hypertension    Osteoarthritis    Pancreatitis, acute    PONV (postoperative nausea and vomiting)    Prostatitis    Reflux      PAST SURGICAL HISTORY ([place]):  Past Surgical History:  Procedure Laterality Date   ABCESS DRAINAGE     ABDOMINAL   APPENDECTOMY     CHOLECYSTECTOMY     COLONOSCOPY N/A 06/09/2015   Procedure: COLONOSCOPY;  Surgeon: Daneil Dolin, MD;  Location: [facility];  Service: Endoscopy;  Laterality: N/A;  11:30  Am   HERNIA REPAIR     KNEE SURGERY     Both   LAPAROSCOPIC LYSIS OF ADHESIONS N/A 08/19/2020   Procedure: LAPAROSCOPIC LYSIS OF ADHESIONS;  Surgeon: Armandina Gemma, MD;  Location: [facility] ORS;  Service: General;  Laterality: N/A;   LAPAROSCOPIC SIGMOID COLECTOMY N/A 11/17/2013   Procedure: LAPAROSCOPIC SIGMOID COLECTOMY rigid proctoscopy;  Surgeon: Edward Jolly, MD;  Location: [facility] ORS;  Service: General;  Laterality: N/A;   LAPAROSCOPY N/A 08/19/2020   Procedure: LAPAROSCOPY DIAGNOSTIC AND OPEN LEFT INGIUNAL HERNIA REPAIR WITH MESH;  Surgeon: Armandina Gemma, MD;  Location: [facility] OR[facility];  Service: General;  Laterality: N/A;   SHOULDER ARTHROSCOPY     x3   SHOULDER SURGERY Left May 2016   SPHINCTEROTOMY     for sphincter of Oddi dysfunction   TONSILLECTOMY       MEDICATIONS:  Prior to Admission medications   Medication Sig Start Date End Date Taking? Authorizing Provider  amLODipine (NORVASC) 10 MG tablet Take 1 tablet (10 mg total) by mouth at bedtime. 08/10/20   Lovena Le, Malena M, DO  Black Elderberry (SAMBUCUS ELDERBERRY PO) Take 1 tablet by mouth daily.    [provider]  fluticasone Asencion Islam) 50 MCG/ACT nasal spray Use 2 spray(s) in each nostril once daily 05/19/21   Lovena Le, Malena M, DO  hyoscyamine (LEVSIN) 0.125 MG tablet Take 1 tablet (0.125 mg total) by mouth every 6 (six) hours as needed  for cramping. Patient not taking: No sig reported 07/13/20   Pyrtle, Lajuan Lines, MD  ibuprofen (ADVIL) 800 MG tablet Take 800 mg by mouth every 8 (eight) hours as needed for mild pain or moderate pain.    [provider]  linaclotide Rolan Lipa) 145 MCG CAPS capsule Take 1 capsule (145 mcg total) by mouth daily before breakfast. Patient not taking: No sig reported 06/28/20   Pyrtle, Lajuan Lines, MD  lisinopril (ZESTRIL) 5 MG tablet Take 1 tablet by mouth once daily Patient not taking: No sig reported 09/13/20   Elvia Collum M, DO  lovastatin (MEVACOR) 40 MG tablet Take 1 tablet (40 mg total) by mouth  at bedtime. 07/29/20   Lovena Le, Malena M, DO  melatonin 3 MG TABS tablet Take 6 mg by mouth at bedtime. Patient not taking: No sig reported    [provider]  Multiple Vitamins-Minerals (ALIVE MENS ENERGY PO) Take 1 tablet by mouth daily.    [provider]  neomycin-polymyxin-hydrocortisone (CORTISPORIN) 3.5-10000-1 OTIC suspension Place 3 drops in the affected ear four times daily Patient taking differently: Place 3 drops into both ears daily as needed (When goes in to the pool). 04/14/20   Lovena Le, Malena M, DO  omeprazole (PRILOSEC) 40 MG capsule Take 1 capsule (40 mg total) by mouth daily. 07/12/20   Lovena Le, Malena M, DO  ondansetron (ZOFRAN ODT) 4 MG disintegrating tablet Take 1 tablet (4 mg total) by mouth every 8 (eight) hours as needed for nausea or vomiting. Patient not taking: No sig reported 06/28/20   Pyrtle, Lajuan Lines, MD  oxyCODONE (OXY IR/ROXICODONE) 5 MG immediate release tablet Take 1-2 tablets (5-10 mg total) by mouth every 6 (six) hours as needed for moderate pain. Patient not taking: No sig reported 08/20/20   Armandina Gemma, MD  polyethylene glycol (MIRALAX / GLYCOLAX) 17 g packet Take 17 g by mouth 2 (two) times daily. Patient not taking: No sig reported    [provider]  silodosin (RAPAFLO) 8 MG CAPS capsule Take 1 capsule (8 mg total) by mouth daily with breakfast. Patient not taking: No sig reported 01/14/21   Cleon Gustin, MD  tadalafil (CIALIS) 5 MG tablet Take 1 tablet (5 mg total) by mouth daily. 02/23/21   McKenzie, Candee Furbish, MD  vardenafil (LEVITRA) 20 MG tablet Take 1 tablet (20 mg total) by mouth daily as needed for erectile dysfunction. 01/14/21   McKenzie, Candee Furbish, MD  zolpidem (AMBIEN) 10 MG tablet Take 1 tablet (10 mg total) by mouth at bedtime. 07/26/20   Erven Colla, DO     ALLERGIES:  Allergies  Allergen Reactions   Augmentin [Amoxicillin-Pot Clavulanate] Diarrhea   Acyclovir And Related Swelling    Mouth swelling and sores    Biaxin [Clarithromycin]     Headache and vomiting,sores in mouth   Flomax [Tamsulosin Hcl]     Headache, dizziness   Other Nausea And Vomiting    anesthesia      SOCIAL HISTORY:  Social History   Socioeconomic History   Marital status: Married    Spouse name: Not on file   Number of children: Not on file   Years of education: Not on file   Highest education level: Not on file  Occupational History   Not on file  Tobacco Use   Smoking status: Former    Years: 2.00    Types: Cigarettes    Quit date: 11/10/1986    Years since quitting: 34.5   Smokeless tobacco:  Never  Vaping Use   Vaping Use: Never used  Substance and Sexual Activity   Alcohol use: Yes    Alcohol/week: 1.0 standard drink    Types: 1 Glasses of wine per week    Comment: 2-3 glasses a week.    Drug use: No   Sexual activity: Yes    Birth control/protection: None  Other Topics Concern   Not on file  Social History Narrative   Not on file   Social Determinants of Health   Financial Resource Strain: Not on file  Food Insecurity: Not on file  Transportation Needs: Not on file  Physical Activity: Not on file  Stress: Not on file  Social Connections: Not on file  Intimate Partner Violence: Not on file      FAMILY HISTORY:  Family History  Problem Relation Age of Onset   Cancer Father        ? Colon mass removed    Ulcerative colitis Sister    Colon cancer Maternal Grandmother        34s   Esophageal cancer Neg Hx    Pancreatic cancer Neg Hx    Stomach cancer Neg Hx      REVIEW OF SYSTEMS:  Constitutional: denies weight loss, fever, chills, or sweats  Eyes: denies any other vision changes, history of eye injury  ENT: denies sore throat, hearing problems  Respiratory: denies shortness of breath, wheezing  Cardiovascular: denies chest pain, palpitations  Gastrointestinal: Positive abdominal pain, denies nausea and vomiting Genitourinary: denies burning with urination or urinary  frequency Musculoskeletal: denies any other joint pains or cramps  Skin: denies any other rashes or skin discolorations  Neurological: denies any other headache, dizziness, weakness  Psychiatric: denies any other depression, anxiety   All other review of systems were negative   VITAL SIGNS:  Temp:  [98.4 F (36.9 C)] 98.4 F (36.9 C) (08/17 1218) Pulse Rate:  [87-102] 89 (08/17 1500) Resp:  [18] 18 (08/17 1500) BP: (105-117)/(70-95) 105/70 (08/17 1500) SpO2:  [97 %] 97 % (08/17 1500) Weight:  [91.6 kg] 91.6 kg (08/17 1220)     Height: '6\' 1"'$  (185.4 cm) Weight: 91.6 kg BMI (Calculated): 26.66   INTAKE/OUTPUT:  This shift: No intake/output data recorded.  Last 2 shifts: '@IOLAST2SHIFTS'$ @   PHYSICAL EXAM:  Constitutional:  -- Normal body habitus  -- Awake, alert, and oriented x3  Eyes:  -- Pupils equally round and reactive to light  -- No scleral icterus  Ear, nose, and throat:  -- No jugular venous distension  Pulmonary:  -- No crackles  -- Equal breath sounds bilaterally -- Breathing non-labored at rest Cardiovascular:  -- S1, S2 present  -- No pericardial rubs Gastrointestinal:  -- Abdomen tender to palpation in all quadrants, distended, with guarding and rebound tenderness -- No abdominal masses appreciated, pulsatile or otherwise  Musculoskeletal and Integumentary:  -- Wounds: None appreciated -- Extremities: B/L UE and LE FROM, hands and feet warm, no edema  Neurologic:  -- Motor function: intact and symmetric -- Sensation: intact and symmetric   Labs:  CBC Latest Ref Rng & Units 06/01/2021 08/13/2020 05/11/2020  WBC 4.0 - 10.5 K/uL 3.4(L) 7.0 7.1  Hemoglobin 13.0 - 17.0 g/dL 15.1 14.6 14.6  Hematocrit 39.0 - 52.0 % 43.9 43.0 42.7  Platelets 150 - 400 K/uL 212 252 223.0   CMP Latest Ref Rng & Units 06/01/2021 08/13/2020 05/11/2020  Glucose 70 - 99 mg/dL 113(H) 129(H) 95  BUN 6 - 20 mg/dL 24(H) 22(H)  17  Creatinine 0.61 - 1.24 mg/dL 0.77 0.72 0.85  Sodium 135 -  145 mmol/L 139 137 136  Potassium 3.5 - 5.1 mmol/L 3.4(L) 3.5 3.6  Chloride 98 - 111 mmol/L 106 102 100  CO2 22 - 32 mmol/L '23 23 28  '$ Calcium 8.9 - 10.3 mg/dL 8.6(L) 9.1 9.7  Total Protein 6.5 - 8.1 g/dL 7.3 - 7.7  Total Bilirubin 0.3 - 1.2 mg/dL 0.6 - 0.8  Alkaline Phos 38 - 126 U/L 160(H) - 59  AST 15 - 41 U/L 135(H) - 20  ALT 0 - 44 U/L 152(H) - 25     Imaging studies:  EXAM: CT ABDOMEN AND PELVIS WITH CONTRAST   TECHNIQUE: Multidetector CT imaging of the abdomen and pelvis was performed using the standard protocol following bolus administration of intravenous contrast.   CONTRAST:  170m OMNIPAQUE IOHEXOL 350 MG/ML SOLN   COMPARISON:  01/15/2020   FINDINGS: Lower chest: Descending thoracic aortic atherosclerotic calcification.   Hepatobiliary: Cholecystectomy.  Otherwise unremarkable.   Pancreas: Unremarkable   Spleen: Unremarkable   Adrenals/Urinary Tract: Unremarkable   Stomach/Bowel: Substantial abnormal anterior gastric wall thickening for example on image 4 series 7, with single wall thickness up to 2.2 cm in diameter as compared to posterior wall thickness of only about 0.4 cm. This area of gastric wall thickening extends over an approximately 18 cm excursion and could represent tumor or unusual inflammation.   Scattered loops of dilated small bowel up to about 4.2 cm in diameter, with dilated loops separated by nondilated segments potentially demonstrating wall thickening and accentuated mucosal enhancement and with some associated faint mesenteric stranding as on image 61 of series 6 and at the transition in the right anterior abdominal bowel on image 55 series 2. Other areas of nondistended and potentially inflamed small bowel separating mildly dilated loops identified. The distal most ileum is not dilated although there are some scattered air-fluid levels within it. Possible fold thickening in loops of jejunum for example in the left lower quadrant  on image 39 series 5. I note more pronounced swirling of mesenteric vasculature around the distal ileum for example on images 50 through 70 of series 5, compared to the 01/15/2020 exam. No pneumatosis is identified.   There is a sigmoid colon anastomosis, an unusual course of the colon with the transverse colon extending posterior to the small bowel loops in the mid abdomen at about the level of the aortic bifurcation as shown on image 60 of series 2. Presumably this represents postoperative mobilization of the colon, and this configuration is unchanged from 01/15/2020. There are couple of diverticula at the junction of the descending and sigmoid colon.   Vascular/Lymphatic: Mild abdominal aortic atherosclerotic vascular calcification.   Reproductive: Mild prostatomegaly.   Other: Trace free pelvic fluid just above the urinary bladder.   Musculoskeletal: Thoracolumbar spondylosis. Left foraminal impingement at L5-S1.   IMPRESSION: 1. Striking wall thickening anteriorly in the gastric body and antrum. Although possibly inflammatory, I cannot exclude tumor such as gastric adenocarcinoma or lymphoma. Endoscopy should be considered in the appropriate setting. 2. Scattered loops of mildly dilated small bowel separated by nondilated but potentially inflamed loops of small bowel. In combination with the increased swirling of mesenteric vasculature around the distal ileum shown on images 50 through 70 of series 5, possibility of a low-grade mesenteric volvulus is raised. No pneumatosis is identified. 3. Shortened colon, with the transverse colon extending posterior to small bowel loops at about the level of the bifurcation,  and anastomotic staple line in the sigmoid colon, as on the prior exam. 4. Other imaging findings of potential clinical significance: Aortic Atherosclerosis (ICD10-I70.0). Mild prostatomegaly. Left foraminal impingement at L5-S1.     Electronically Signed   By:  Van Clines M.D.   On: 06/01/2021 16:44  Assessment/Plan:  55 y.o. male with small intestinal volvulus/close loop obstruction, complicated by pertinent comorbidities including hypertension.  Patient with severe abdominal pain with physical exam concerning of acute abdomen.  CT scan shows closed-loop obstruction of the small bowel, possible volvulus due to adhesions.  Patient with previous surgeries including sigmoid colectomy, open appendectomy.  Due to the physical exam and CT finding concerning of volvulus I discussed with the patient the urgency of surgical management due to possible compromise of intestinal circulation and ischemia.  I discussed with the patient the rationale of exploratory laparotomy and possible bowel resection if there is any bowel ischemia.  Discussed with the patient the risk of surgery that includes bleeding, infection, injury to intestine, leak from any anastomosis or bowel perforation, fistula, unable others.  Also discussed with wife the acute situation. Both agrees to proceed with plan.   Arnold Long, MD

## 2021-06-02 ENCOUNTER — Encounter: Payer: Self-pay | Admitting: General Surgery

## 2021-06-02 LAB — HIV ANTIBODY (ROUTINE TESTING W REFLEX): HIV Screen 4th Generation wRfx: NONREACTIVE

## 2021-06-02 MED ORDER — KETOROLAC TROMETHAMINE 30 MG/ML IJ SOLN
30.0000 mg | Freq: Four times a day (QID) | INTRAMUSCULAR | Status: AC
  Administered 2021-06-02 – 2021-06-04 (×8): 30 mg via INTRAVENOUS
  Filled 2021-06-02 (×8): qty 1

## 2021-06-02 MED ORDER — ONDANSETRON HCL 4 MG/2ML IJ SOLN
4.0000 mg | Freq: Four times a day (QID) | INTRAMUSCULAR | Status: DC | PRN
  Administered 2021-06-02 – 2021-06-04 (×2): 4 mg via INTRAVENOUS
  Filled 2021-06-02 (×4): qty 2

## 2021-06-02 MED ORDER — HYDROMORPHONE HCL 1 MG/ML IJ SOLN
1.0000 mg | INTRAMUSCULAR | Status: DC | PRN
  Administered 2021-06-02 – 2021-06-09 (×34): 1 mg via INTRAVENOUS
  Filled 2021-06-02 (×36): qty 1

## 2021-06-02 NOTE — Progress Notes (Signed)
Patient's NG tube was clamped today but he is experiencing nausea and acid reflux so he requested to have the NG tube put back to low intermittent suction.  Will continue to monitor.  [name]

## 2021-06-02 NOTE — Progress Notes (Signed)
Patient ID: William [name], male   DOB: 07-26-1966, 55 y.o.   MRN: [id number]     [place] Hospital Day(s): 1.   Interval History: Patient seen and examined, no acute events or new complaints overnight. Patient reports feeling better after adding Toradol.  Denies any nausea or vomiting.  Denies passing gas.  Vital signs in last 24 hours: [min-max] current  Temp:  [96.8 F (36 C)-98.5 F (36.9 C)] 98.5 F (36.9 C) (08/18 0550) Pulse Rate:  [79-98] 85 (08/18 1606) Resp:  [14-19] 19 (08/18 1606) BP: (119-144)/(82-92) 131/92 (08/18 1606) SpO2:  [93 %-100 %] 98 % (08/18 1606)     Height: '6\' 1"'$  (185.4 cm) Weight: 91.6 kg BMI (Calculated): 26.66   Physical Exam:  Constitutional: alert, cooperative and no distress  Respiratory: breathing non-labored at rest  Cardiovascular: regular rate and sinus rhythm  Gastrointestinal: soft, non-tender, and non-distended  Labs:  CBC Latest Ref Rng & Units 06/01/2021 08/13/2020 05/11/2020  WBC 4.0 - 10.5 K/uL 3.4(L) 7.0 7.1  Hemoglobin 13.0 - 17.0 g/dL 15.1 14.6 14.6  Hematocrit 39.0 - 52.0 % 43.9 43.0 42.7  Platelets 150 - 400 K/uL 212 252 223.0   CMP Latest Ref Rng & Units 06/01/2021 08/13/2020 05/11/2020  Glucose 70 - 99 mg/dL 113(H) 129(H) 95  BUN 6 - 20 mg/dL 24(H) 22(H) 17  Creatinine 0.61 - 1.24 mg/dL 0.77 0.72 0.85  Sodium 135 - 145 mmol/L 139 137 136  Potassium 3.5 - 5.1 mmol/L 3.4(L) 3.5 3.6  Chloride 98 - 111 mmol/L 106 102 100  CO2 22 - 32 mmol/L '23 23 28  '$ Calcium 8.9 - 10.3 mg/dL 8.6(L) 9.1 9.7  Total Protein 6.5 - 8.1 g/dL 7.3 - 7.7  Total Bilirubin 0.3 - 1.2 mg/dL 0.6 - 0.8  Alkaline Phos 38 - 126 U/L 160(H) - 59  AST 15 - 41 U/L 135(H) - 20  ALT 0 - 44 U/L 152(H) - 25    Imaging studies: No new pertinent imaging studies   Assessment/Plan:  55 y.o. male with abdominal internal hernia 1 Day Post-Op s/p reduction and repair.  Patient with stable vital signs, no fever, no tachycardia.  Still not passing gas.  I will  keep patient with NGT clamp trial.  If he gets nauseous he can be put back to suction.  Patient can take ice chips.  Patient ambulate adequately.  Arnold Long, MD

## 2021-06-03 LAB — PHOSPHORUS: Phosphorus: 3 mg/dL (ref 2.5–4.6)

## 2021-06-03 LAB — BASIC METABOLIC PANEL
Anion gap: 14 (ref 5–15)
BUN: 29 mg/dL — ABNORMAL HIGH (ref 6–20)
CO2: 29 mmol/L (ref 22–32)
Calcium: 8.9 mg/dL (ref 8.9–10.3)
Chloride: 96 mmol/L — ABNORMAL LOW (ref 98–111)
Creatinine, Ser: 1.08 mg/dL (ref 0.61–1.24)
GFR, Estimated: >60 mL/min (ref >60)
Glucose, Bld: 102 mg/dL — ABNORMAL HIGH (ref 70–99)
Potassium: 2.9 mmol/L — ABNORMAL LOW (ref 3.5–5.1)
Sodium: 139 mmol/L (ref 135–145)

## 2021-06-03 LAB — CBC
HCT: 42.2 % (ref 39.0–52.0)
Hemoglobin: 14.7 g/dL (ref 13.0–17.0)
MCH: 30.6 pg (ref 26.0–34.0)
MCHC: 34.8 g/dL (ref 30.0–36.0)
MCV: 87.9 fL (ref 80.0–100.0)
Platelets: 255 10*3/uL (ref 150–400)
RBC: 4.8 MIL/uL (ref 4.22–5.81)
RDW: 13.8 % (ref 11.5–15.5)
WBC: 6.4 10*3/uL (ref 4.0–10.5)
nRBC: 0 % (ref 0.0–0.2)

## 2021-06-03 LAB — MAGNESIUM: Magnesium: 1.8 mg/dL (ref 1.7–2.4)

## 2021-06-03 MED ORDER — PHENOL 1.4 % MT LIQD
1.0000 | OROMUCOSAL | Status: DC | PRN
  Filled 2021-06-03: qty 177

## 2021-06-03 NOTE — Progress Notes (Signed)
Patient ID: William [name], male   DOB: 05/09/1966, 55 y.o.   MRN: [id number]     [place] Hospital Day(s): 2.   Interval History: Patient seen and examined, no acute events or new complaints overnight. Patient reports feeling better after NGT was placed to suction.  Patient had nausea last night and NGT was placed to suction with immediate drainage of a liter of bilious content.  Denies passing gas  Vital signs in last 24 hours: [min-max] current  Temp:  [97.4 F (36.3 C)-99 F (37.2 C)] 97.4 F (36.3 C) (08/19 0340) Pulse Rate:  [85-93] 93 (08/19 0340) Resp:  [16-19] 16 (08/19 0340) BP: (127-131)/(85-92) 127/86 (08/19 0340) SpO2:  [93 %-98 %] 93 % (08/19 0340)     Height: '6\' 1"'$  (185.4 cm) Weight: 91.6 kg BMI (Calculated): 26.66   Physical Exam:  Constitutional: alert, cooperative and no distress  Respiratory: breathing non-labored at rest  Cardiovascular: regular rate and sinus rhythm  Gastrointestinal: soft, non-tender, and mild-distended  Labs:  CBC Latest Ref Rng & Units 06/01/2021 08/13/2020 05/11/2020  WBC 4.0 - 10.5 K/uL 3.4(L) 7.0 7.1  Hemoglobin 13.0 - 17.0 g/dL 15.1 14.6 14.6  Hematocrit 39.0 - 52.0 % 43.9 43.0 42.7  Platelets 150 - 400 K/uL 212 252 223.0   CMP Latest Ref Rng & Units 06/01/2021 08/13/2020 05/11/2020  Glucose 70 - 99 mg/dL 113(H) 129(H) 95  BUN 6 - 20 mg/dL 24(H) 22(H) 17  Creatinine 0.61 - 1.24 mg/dL 0.77 0.72 0.85  Sodium 135 - 145 mmol/L 139 137 136  Potassium 3.5 - 5.1 mmol/L 3.4(L) 3.5 3.6  Chloride 98 - 111 mmol/L 106 102 100  CO2 22 - 32 mmol/L '23 23 28  '$ Calcium 8.9 - 10.3 mg/dL 8.6(L) 9.1 9.7  Total Protein 6.5 - 8.1 g/dL 7.3 - 7.7  Total Bilirubin 0.3 - 1.2 mg/dL 0.6 - 0.8  Alkaline Phos 38 - 126 U/L 160(H) - 59  AST 15 - 41 U/L 135(H) - 20  ALT 0 - 44 U/L 152(H) - 25    Imaging studies: No new pertinent imaging studies   Assessment/Plan:  55 y.o. male with abdominal internal hernia 2 Day Post-Op s/p reduction and  repair.  Patient with expected postoperative ileus.  NGT back to suction with adequate output.  We will continue low intermittent suction until still feeling better.  Patient ambulating adequately.  We will continue with DVT prophylaxis.  We will continue with pain management.  Arnold Long, MD

## 2021-06-03 NOTE — Plan of Care (Signed)
Continuing with plan of care. 

## 2021-06-03 NOTE — Progress Notes (Signed)
Patient's NG output was 1100 mL after connected to low intermittent suction. Patient's nausea has subsided.  Will continue to monitor output. [name]

## 2021-06-04 MED ORDER — SODIUM CHLORIDE 0.9 % IV SOLN
INTRAVENOUS | Status: DC | PRN
  Administered 2021-06-04 – 2021-06-06 (×2): 250 mL via INTRAVENOUS

## 2021-06-04 MED ORDER — KETOROLAC TROMETHAMINE 30 MG/ML IJ SOLN
30.0000 mg | Freq: Three times a day (TID) | INTRAMUSCULAR | Status: AC
  Administered 2021-06-04 – 2021-06-06 (×6): 30 mg via INTRAVENOUS
  Filled 2021-06-04 (×6): qty 1

## 2021-06-04 NOTE — Progress Notes (Addendum)
Post clamp trial residual is 273m, Dr. CWindell Momentnotified and instructed to continue with the NG clamped and clear liquid diet.

## 2021-06-04 NOTE — Progress Notes (Signed)
Patient ID: William [name], male   DOB: [birth year], 55 y.o.   MRN: [id number]     [place] Hospital Day(s): 3.   Interval History: Patient seen and examined, no acute events or new complaints overnight. Patient reports feeling better today.  He report he is passing gas.  He reported the pain has been improving.  Denies nausea.  Vital signs in last 24 hours: [min-max] current  Temp:  [98.1 F (36.7 C)-98.5 F (36.9 C)] 98.1 F (36.7 C) (08/20 0720) Pulse Rate:  [70-89] 77 (08/20 0720) Resp:  [16-18] 16 (08/20 0720) BP: (115-144)/(78-90) 118/78 (08/20 0720) SpO2:  [96 %-97 %] 97 % (08/20 0720)     Height: '6\' 1"'$  (185.4 cm) Weight: 91.6 kg BMI (Calculated): 26.66   Physical Exam:  Constitutional: alert, cooperative and no distress  Respiratory: breathing non-labored at rest  Cardiovascular: regular rate and sinus rhythm  Gastrointestinal: soft, non-tender, and non-distended.  Wound is dry and clean  Labs:  CBC Latest Ref Rng & Units 06/03/2021 06/01/2021 08/13/2020  WBC 4.0 - 10.5 K/uL 6.4 3.4(L) 7.0  Hemoglobin 13.0 - 17.0 g/dL 14.7 15.1 14.6  Hematocrit 39.0 - 52.0 % 42.2 43.9 43.0  Platelets 150 - 400 K/uL 255 212 252   CMP Latest Ref Rng & Units 06/03/2021 06/01/2021 08/13/2020  Glucose 70 - 99 mg/dL 102(H) 113(H) 129(H)  BUN 6 - 20 mg/dL 29(H) 24(H) 22(H)  Creatinine 0.61 - 1.24 mg/dL 1.08 0.77 0.72  Sodium 135 - 145 mmol/L 139 139 137  Potassium 3.5 - 5.1 mmol/L 2.9(L) 3.4(L) 3.5  Chloride 98 - 111 mmol/L 96(L) 106 102  CO2 22 - 32 mmol/L '29 23 23  '$ Calcium 8.9 - 10.3 mg/dL 8.9 8.6(L) 9.1  Total Protein 6.5 - 8.1 g/dL - 7.3 -  Total Bilirubin 0.3 - 1.2 mg/dL - 0.6 -  Alkaline Phos 38 - 126 U/L - 160(H) -  AST 15 - 41 U/L - 135(H) -  ALT 0 - 44 U/L - 152(H) -    Imaging studies: No new pertinent imaging studies   Assessment/Plan:  55 y.o. male with abdominal internal hernia 3 Day Post-Op s/p reduction and repair.  Patient to be feeling better.  He started  passing gas.  The abdomen is softer.  I will clamp again the NGT into a clamp trial with clear liquids.  If he tolerates will consider removing the NGT.  Patient ambulating adequately.  We will continue with DVT prophylaxis.  We will continue with pain management.  Arnold Long, MD

## 2021-06-04 NOTE — Plan of Care (Signed)
Continuing with plan of care. 

## 2021-06-05 ENCOUNTER — Encounter: Payer: Self-pay | Admitting: General Surgery

## 2021-06-05 ENCOUNTER — Inpatient Hospital Stay: Payer: BC Managed Care – PPO

## 2021-06-05 LAB — CBC
HCT: 40.8 % (ref 39.0–52.0)
Hemoglobin: 14.3 g/dL (ref 13.0–17.0)
MCH: 30.5 pg (ref 26.0–34.0)
MCHC: 35 g/dL (ref 30.0–36.0)
MCV: 87 fL (ref 80.0–100.0)
Platelets: 332 10*3/uL (ref 150–400)
RBC: 4.69 MIL/uL (ref 4.22–5.81)
RDW: 13.2 % (ref 11.5–15.5)
WBC: 6.6 10*3/uL (ref 4.0–10.5)
nRBC: 0 % (ref 0.0–0.2)

## 2021-06-05 LAB — BASIC METABOLIC PANEL
Anion gap: 16 — ABNORMAL HIGH (ref 5–15)
BUN: 22 mg/dL — ABNORMAL HIGH (ref 6–20)
CO2: 32 mmol/L (ref 22–32)
Calcium: 9.1 mg/dL (ref 8.9–10.3)
Chloride: 95 mmol/L — ABNORMAL LOW (ref 98–111)
Creatinine, Ser: 0.84 mg/dL (ref 0.61–1.24)
GFR, Estimated: >60 mL/min (ref >60)
Glucose, Bld: 111 mg/dL — ABNORMAL HIGH (ref 70–99)
Potassium: 2.6 mmol/L — CL (ref 3.5–5.1)
Sodium: 143 mmol/L (ref 135–145)

## 2021-06-05 LAB — PHOSPHORUS: Phosphorus: 3.4 mg/dL (ref 2.5–4.6)

## 2021-06-05 LAB — MAGNESIUM: Magnesium: 2.2 mg/dL (ref 1.7–2.4)

## 2021-06-05 MED ORDER — DIATRIZOATE MEGLUMINE & SODIUM 66-10 % PO SOLN
90.0000 mL | Freq: Once | ORAL | Status: AC
  Administered 2021-06-05: 90 mL via NASOGASTRIC

## 2021-06-05 MED ORDER — POTASSIUM CHLORIDE 20 MEQ PO PACK
40.0000 meq | PACK | Freq: Two times a day (BID) | ORAL | Status: AC
  Administered 2021-06-05 (×2): 40 meq
  Filled 2021-06-05 (×2): qty 2

## 2021-06-05 MED ORDER — PANTOPRAZOLE SODIUM 40 MG IV SOLR
40.0000 mg | Freq: Every day | INTRAVENOUS | Status: DC
  Administered 2021-06-05 – 2021-06-10 (×6): 40 mg via INTRAVENOUS
  Filled 2021-06-05 (×6): qty 40

## 2021-06-05 MED ORDER — POTASSIUM CHLORIDE 10 MEQ/100ML IV SOLN
10.0000 meq | INTRAVENOUS | Status: DC
Start: 2021-06-05 — End: 2021-06-05

## 2021-06-05 MED ORDER — FAMOTIDINE IN NACL 20-0.9 MG/50ML-% IV SOLN
20.0000 mg | Freq: Every morning | INTRAVENOUS | Status: DC
  Administered 2021-06-05 – 2021-06-09 (×5): 20 mg via INTRAVENOUS
  Filled 2021-06-05 (×6): qty 50

## 2021-06-05 MED ORDER — POTASSIUM CHLORIDE IN NACL 20-0.9 MEQ/L-% IV SOLN
INTRAVENOUS | Status: DC
  Filled 2021-06-05 (×11): qty 1000

## 2021-06-05 NOTE — Progress Notes (Signed)
PHARMACY CONSULT NOTE - FOLLOW UP  Pharmacy Consult for Electrolyte Monitoring and Replacement   Recent Labs: Potassium (mmol/L)  Date Value  06/05/2021 2.6 (LL)   Magnesium (mg/dL)  Date Value  06/05/2021 2.2   Calcium (mg/dL)  Date Value  06/05/2021 9.1   Albumin (g/dL)  Date Value  06/01/2021 4.1  11/28/2019 4.9   Phosphorus (mg/dL)  Date Value  06/05/2021 3.4   Sodium (mmol/L)  Date Value  06/05/2021 143  11/28/2019 140     Assessment: 55 year old male with abdominal hernia s/p reduction and repair. Patient with postoperative ileus. Patient is on CLD. Pharmacy consult to replace electrolytes.  Goal of Therapy:  Electrolytes WNL  Plan:  --Okay NGT for replacement per surgeon --Potassium 40 mEq per tube x 2 doses today --Add 20 mEq to NS at 75 ml/hr while patient with decreased PO intake --Recheck electrolytes with morning labs  Tawnya Crook, PharmD, BCPS Clinical Pharmacist 06/05/2021 2:11 PM

## 2021-06-05 NOTE — Plan of Care (Signed)
Continuing with plan of care. 

## 2021-06-05 NOTE — Progress Notes (Signed)
Patient ID: William [name], male   DOB: [birth year], 55 y.o.   MRN: [id number]     [facility] Hospital Day(s): 4.   Interval History: Patient seen and examined, no acute events or new complaints overnight. Patient reports he felt distended last night.  NGT was placed back to suction and he had a liter draining out.  He feels endorses still passing gas  Vital signs in last 24 hours: [min-max] current  Temp:  [97.9 F (36.6 C)-98.6 F (37 C)] 97.9 F (36.6 C) (08/21 0447) Pulse Rate:  [71-75] 71 (08/21 0447) Resp:  [16-20] 20 (08/21 0447) BP: (129-140)/(86-94) 134/86 (08/21 0447) SpO2:  [96 %-98 %] 96 % (08/21 0447)     Height: '6\' 1"'$  (185.4 cm) Weight: 91.6 kg BMI (Calculated): 26.66   Physical Exam:  Constitutional: alert, cooperative and no distress  Respiratory: breathing non-labored at rest  Cardiovascular: regular rate and sinus rhythm  Gastrointestinal: soft, non-tender, and mild-distended  Labs:  CBC Latest Ref Rng & Units 06/03/2021 06/01/2021 08/13/2020  WBC 4.0 - 10.5 K/uL 6.4 3.4(L) 7.0  Hemoglobin 13.0 - 17.0 g/dL 14.7 15.1 14.6  Hematocrit 39.0 - 52.0 % 42.2 43.9 43.0  Platelets 150 - 400 K/uL 255 212 252   CMP Latest Ref Rng & Units 06/03/2021 06/01/2021 08/13/2020  Glucose 70 - 99 mg/dL 102(H) 113(H) 129(H)  BUN 6 - 20 mg/dL 29(H) 24(H) 22(H)  Creatinine 0.61 - 1.24 mg/dL 1.08 0.77 0.72  Sodium 135 - 145 mmol/L 139 139 137  Potassium 3.5 - 5.1 mmol/L 2.9(L) 3.4(L) 3.5  Chloride 98 - 111 mmol/L 96(L) 106 102  CO2 22 - 32 mmol/L '29 23 23  '$ Calcium 8.9 - 10.3 mg/dL 8.9 8.6(L) 9.1  Total Protein 6.5 - 8.1 g/dL - 7.3 -  Total Bilirubin 0.3 - 1.2 mg/dL - 0.6 -  Alkaline Phos 38 - 126 U/L - 160(H) -  AST 15 - 41 U/L - 135(H) -  ALT 0 - 44 U/L - 152(H) -    Imaging studies: No new pertinent imaging studies   Assessment/Plan:  55 y.o. male with abdominal internal hernia 4 Day Post-Op s/p reduction and repair.  Still recurrent postoperative ileus.  The fact  that he is passing gas but he still getting distended means that intestine are still slower than what he can move.  We will do Gastrografin challenge for further evaluation.  We will also check electrolytes and replace as needed.  Encourage patient to ambulate.  We will continue with DVT prophylaxis.  Arnold Long, MD

## 2021-06-06 ENCOUNTER — Inpatient Hospital Stay: Payer: BC Managed Care – PPO

## 2021-06-06 LAB — BASIC METABOLIC PANEL
Anion gap: 13 (ref 5–15)
BUN: 31 mg/dL — ABNORMAL HIGH (ref 6–20)
CO2: 31 mmol/L (ref 22–32)
Calcium: 9.3 mg/dL (ref 8.9–10.3)
Chloride: 100 mmol/L (ref 98–111)
Creatinine, Ser: 1.03 mg/dL (ref 0.61–1.24)
GFR, Estimated: >60 mL/min (ref >60)
Glucose, Bld: 103 mg/dL — ABNORMAL HIGH (ref 70–99)
Potassium: 3.3 mmol/L — ABNORMAL LOW (ref 3.5–5.1)
Sodium: 144 mmol/L (ref 135–145)

## 2021-06-06 LAB — MAGNESIUM: Magnesium: 2.4 mg/dL (ref 1.7–2.4)

## 2021-06-06 MED ORDER — POTASSIUM CHLORIDE 20 MEQ PO PACK
40.0000 meq | PACK | Freq: Once | ORAL | Status: AC
  Administered 2021-06-06: 40 meq
  Filled 2021-06-06: qty 2

## 2021-06-06 NOTE — Progress Notes (Signed)
PHARMACY CONSULT NOTE - FOLLOW UP  Pharmacy Consult for Electrolyte Monitoring and Replacement   Recent Labs: Potassium (mmol/L)  Date Value  06/06/2021 3.3 (L)   Magnesium (mg/dL)  Date Value  06/06/2021 2.4   Calcium (mg/dL)  Date Value  06/06/2021 9.3   Albumin (g/dL)  Date Value  06/01/2021 4.1  11/28/2019 4.9   Phosphorus (mg/dL)  Date Value  06/05/2021 3.4   Sodium (mmol/L)  Date Value  06/06/2021 144  11/28/2019 140     Assessment: 55 year old male with abdominal hernia s/p reduction and repair. Patient with postoperative ileus. Pharmacy consult to replace electrolytes. Received Kcl 40 meq per tube x 2 doses yesterday, and Kcl 20 meq in fluids running 75 mL/hr (total 36 mEq/24 hours). K low again but improved to 3.3.   Goal of Therapy:  K WNL Mg WNL  Plan:  Will give 40 mEq x 1 today. F/u BMP in the AM.   Wynelle Cleveland, PharmD Pharmacy Resident  06/06/2021 7:49 AM

## 2021-06-06 NOTE — Progress Notes (Signed)
Patient ID: William [name], male   DOB: 08-28-1966, 55 y.o.   MRN: [id number]     [facility] Hospital Day(s): 5.   Interval History: Patient seen and examined, no acute events or new complaints overnight. Patient reports continued having intermittent abdominal bloating and distention.  Denies significant abdominal pain.  Endorses continue passing gas  Vital signs in last 24 hours: [min-max] current  Temp:  [97.5 F (36.4 C)-98.2 F (36.8 C)] 97.7 F (36.5 C) (08/22 1514) Pulse Rate:  [72-101] 72 (08/22 1514) Resp:  [16-18] 16 (08/22 1007) BP: (120-134)/(90-98) 129/98 (08/22 1514) SpO2:  [95 %-98 %] 98 % (08/22 1514)     Height: '6\' 1"'$  (185.4 cm) Weight: 91.6 kg BMI (Calculated): 26.66   Physical Exam:  Constitutional: alert, cooperative and no distress  Respiratory: breathing non-labored at rest  Cardiovascular: regular rate and sinus rhythm  Gastrointestinal: soft, non-tender, and non-distended  Labs:  CBC Latest Ref Rng & Units 06/05/2021 06/03/2021 06/01/2021  WBC 4.0 - 10.5 K/uL 6.6 6.4 3.4(L)  Hemoglobin 13.0 - 17.0 g/dL 14.3 14.7 15.1  Hematocrit 39.0 - 52.0 % 40.8 42.2 43.9  Platelets 150 - 400 K/uL 332 255 212   CMP Latest Ref Rng & Units 06/06/2021 06/05/2021 06/03/2021  Glucose 70 - 99 mg/dL 103(H) 111(H) 102(H)  BUN 6 - 20 mg/dL 31(H) 22(H) 29(H)  Creatinine 0.61 - 1.24 mg/dL 1.03 0.84 1.08  Sodium 135 - 145 mmol/L 144 143 139  Potassium 3.5 - 5.1 mmol/L 3.3(L) 2.6(LL) 2.9(L)  Chloride 98 - 111 mmol/L 100 95(L) 96(L)  CO2 22 - 32 mmol/L 31 32 29  Calcium 8.9 - 10.3 mg/dL 9.3 9.1 8.9  Total Protein 6.5 - 8.1 g/dL - - -  Total Bilirubin 0.3 - 1.2 mg/dL - - -  Alkaline Phos 38 - 126 U/L - - -  AST 15 - 41 U/L - - -  ALT 0 - 44 U/L - - -    Imaging studies: Abdominal x-ray shows persistent small bowel dilation.  There is contrast in the large intestine.   Assessment/Plan:  55 y.o. male with abdominal internal hernia 5 Day Post-Op s/p reduction and  repair.  Patient continue with intermittent ileus.  He is passing gas but no moving of causing him to get distended and x-ray shows bowel dilation.  We will continue with conservative management.  Patient ambulating.  We will continue replacing electrolytes.  Arnold Long, MD

## 2021-06-07 LAB — BASIC METABOLIC PANEL
Anion gap: 13 (ref 5–15)
BUN: 26 mg/dL — ABNORMAL HIGH (ref 6–20)
CO2: 30 mmol/L (ref 22–32)
Calcium: 8.9 mg/dL (ref 8.9–10.3)
Chloride: 104 mmol/L (ref 98–111)
Creatinine, Ser: 0.91 mg/dL (ref 0.61–1.24)
GFR, Estimated: >60 mL/min (ref >60)
Glucose, Bld: 104 mg/dL — ABNORMAL HIGH (ref 70–99)
Potassium: 3 mmol/L — ABNORMAL LOW (ref 3.5–5.1)
Sodium: 147 mmol/L — ABNORMAL HIGH (ref 135–145)

## 2021-06-07 LAB — CBC
HCT: 41 % (ref 39.0–52.0)
Hemoglobin: 13.9 g/dL (ref 13.0–17.0)
MCH: 29.7 pg (ref 26.0–34.0)
MCHC: 33.9 g/dL (ref 30.0–36.0)
MCV: 87.6 fL (ref 80.0–100.0)
Platelets: 353 10*3/uL (ref 150–400)
RBC: 4.68 MIL/uL (ref 4.22–5.81)
RDW: 13.3 % (ref 11.5–15.5)
WBC: 7.9 10*3/uL (ref 4.0–10.5)
nRBC: 0 % (ref 0.0–0.2)

## 2021-06-07 MED ORDER — METOCLOPRAMIDE HCL 5 MG/ML IJ SOLN
10.0000 mg | Freq: Three times a day (TID) | INTRAMUSCULAR | Status: DC
  Administered 2021-06-07 – 2021-06-10 (×9): 10 mg via INTRAVENOUS
  Filled 2021-06-07 (×10): qty 2

## 2021-06-07 MED ORDER — POTASSIUM CHLORIDE 20 MEQ PO PACK
40.0000 meq | PACK | Freq: Two times a day (BID) | ORAL | Status: AC
  Administered 2021-06-07 (×2): 40 meq via ORAL
  Filled 2021-06-07 (×2): qty 2

## 2021-06-07 MED ORDER — POTASSIUM CHLORIDE 20 MEQ PO PACK: 40.0000 meq | PACK | Freq: Two times a day (BID) | ORAL | Status: DC

## 2021-06-07 MED ORDER — MENTHOL 3 MG MT LOZG
1.0000 | LOZENGE | OROMUCOSAL | Status: DC | PRN
  Filled 2021-06-07: qty 9

## 2021-06-07 NOTE — Progress Notes (Signed)
PHARMACY CONSULT NOTE - FOLLOW UP  Pharmacy Consult for Electrolyte Monitoring and Replacement   Recent Labs: Potassium (mmol/L)  Date Value  06/07/2021 3.0 (L)   Magnesium (mg/dL)  Date Value  06/06/2021 2.4   Calcium (mg/dL)  Date Value  06/07/2021 8.9   Albumin (g/dL)  Date Value  06/01/2021 4.1  11/28/2019 4.9   Phosphorus (mg/dL)  Date Value  06/05/2021 3.4   Sodium (mmol/L)  Date Value  06/07/2021 147 (H)  11/28/2019 140     Assessment: 55 year old male with abdominal hernia s/p reduction and repair. Patient with postoperative ileus. Pharmacy consult to replace electrolytes. K decreased further from 3.3 to 3.0 today. Yesterday received Kcl 40 mEq in addition to Kcl 20 meq in fluids running 75 mL/hr.   Goal of Therapy:  K WNL Mg WNL  Plan:  Will give 40 mEq x 2 today. F/u BMP in the AM.   Wynelle Cleveland, PharmD Pharmacy Resident  06/07/2021 8:49 AM

## 2021-06-07 NOTE — Progress Notes (Signed)
Patient ID: William [name], male   DOB: 11/26/1965, 55 y.o.   MRN: [id number]     [place] Hospital Day(s): 6.   Interval History: Patient seen and examined, no acute events or new complaints overnight. Patient reports feeling upset this morning because they did not let him get ice chips last night.William [name] continue passing gas.  Patient was placed back to suction.  Denies any significant pain when the pain is controlled with current pain medications.  Vital signs in last 24 hours: [min-max] current  Temp:  [97.5 F (36.4 C)-99.4 F (37.4 C)] 99.4 F (37.4 C) (08/23 0732) Pulse Rate:  [64-85] 64 (08/23 0732) Resp:  [12-16] 12 (08/23 0732) BP: (128-140)/(87-99) 138/92 (08/23 0732) SpO2:  [96 %-99 %] 99 % (08/23 0732)     Height: '6\' 1"'$  (185.4 cm) Weight: 91.6 kg BMI (Calculated): 26.66   Physical Exam:  Constitutional: alert, cooperative and no distress  Respiratory: breathing non-labored at rest  Cardiovascular: regular rate and sinus rhythm  Gastrointestinal: soft, non-tender, and non-distended.  Wounds dry and clean  Labs:  CBC Latest Ref Rng & Units 06/07/2021 06/05/2021 06/03/2021  WBC 4.0 - 10.5 K/uL 7.9 6.6 6.4  Hemoglobin 13.0 - 17.0 g/dL 13.9 14.3 14.7  Hematocrit 39.0 - 52.0 % 41.0 40.8 42.2  Platelets 150 - 400 K/uL 353 332 255   CMP Latest Ref Rng & Units 06/06/2021 06/05/2021 06/03/2021  Glucose 70 - 99 mg/dL 103(H) 111(H) 102(H)  BUN 6 - 20 mg/dL 31(H) 22(H) 29(H)  Creatinine 0.61 - 1.24 mg/dL 1.03 0.84 1.08  Sodium 135 - 145 mmol/L 144 143 139  Potassium 3.5 - 5.1 mmol/L 3.3(L) 2.6(LL) 2.9(L)  Chloride 98 - 111 mmol/L 100 95(L) 96(L)  CO2 22 - 32 mmol/L 31 32 29  Calcium 8.9 - 10.3 mg/dL 9.3 9.1 8.9  Total Protein 6.5 - 8.1 g/dL - - -  Total Bilirubin 0.3 - 1.2 mg/dL - - -  Alkaline Phos 38 - 126 U/L - - -  AST 15 - 41 U/L - - -  ALT 0 - 44 U/L - - -    Imaging studies: No new pertinent imaging studies   Assessment/Plan:  55 y.o. male with  abdominal internal hernia 5 Day Post-Op s/p reduction and repair.  Patient continue with intermittent postoperative ileus.  Still feeling comfortable.  No tachycardia.  No fever.  Levels are within normal limits.  We will continue with electrolyte replacement.  Pending basic metabolic panel today.  We will start Reglan.  Patient ambulating.  We will continue with DVT prophylaxis.  William Long, MD

## 2021-06-08 ENCOUNTER — Inpatient Hospital Stay: Payer: BC Managed Care – PPO

## 2021-06-08 LAB — BASIC METABOLIC PANEL
Anion gap: 13 (ref 5–15)
BUN: 19 mg/dL (ref 6–20)
CO2: 28 mmol/L (ref 22–32)
Calcium: 9.5 mg/dL (ref 8.9–10.3)
Chloride: 106 mmol/L (ref 98–111)
Creatinine, Ser: 0.77 mg/dL (ref 0.61–1.24)
GFR, Estimated: >60 mL/min (ref >60)
Glucose, Bld: 114 mg/dL — ABNORMAL HIGH (ref 70–99)
Potassium: 3.5 mmol/L (ref 3.5–5.1)
Sodium: 147 mmol/L — ABNORMAL HIGH (ref 135–145)

## 2021-06-08 NOTE — Progress Notes (Signed)
Patient ID: William [name], male   DOB: [birth year], 55 y.o.   MRN: [id number]     [facility] Hospital Day(s): 7.   Interval History: Patient seen and examined, no acute events or new complaints overnight. Patient reports feeling less distended.  Pain better controlled.  No nausea or vomiting.  Vital signs in last 24 hours: [min-max] current  Temp:  [97.5 F (36.4 C)-99.2 F (37.3 C)] 97.9 F (36.6 C) (08/24 1515) Pulse Rate:  [68-87] 72 (08/24 1515) Resp:  [16] 16 (08/24 1515) BP: (137-154)/(82-92) 154/92 (08/24 1515) SpO2:  [96 %-100 %] 100 % (08/24 1515)     Height: '6\' 1"'$  (185.4 cm) Weight: 91.6 kg BMI (Calculated): 26.66   Physical Exam:  Constitutional: alert, cooperative and no distress  Respiratory: breathing non-labored at rest  Cardiovascular: regular rate and sinus rhythm  Gastrointestinal: soft, non-tender, and non-distended.  The wound is dry and clean.  Labs:  CBC Latest Ref Rng & Units 06/07/2021 06/05/2021 06/03/2021  WBC 4.0 - 10.5 K/uL 7.9 6.6 6.4  Hemoglobin 13.0 - 17.0 g/dL 13.9 14.3 14.7  Hematocrit 39.0 - 52.0 % 41.0 40.8 42.2  Platelets 150 - 400 K/uL 353 332 255   CMP Latest Ref Rng & Units 06/08/2021 06/07/2021 06/06/2021  Glucose 70 - 99 mg/dL 114(H) 104(H) 103(H)  BUN 6 - 20 mg/dL 19 26(H) 31(H)  Creatinine 0.61 - 1.24 mg/dL 0.77 0.91 1.03  Sodium 135 - 145 mmol/L 147(H) 147(H) 144  Potassium 3.5 - 5.1 mmol/L 3.5 3.0(L) 3.3(L)  Chloride 98 - 111 mmol/L 106 104 100  CO2 22 - 32 mmol/L '28 30 31  '$ Calcium 8.9 - 10.3 mg/dL 9.5 8.9 9.3  Total Protein 6.5 - 8.1 g/dL - - -  Total Bilirubin 0.3 - 1.2 mg/dL - - -  Alkaline Phos 38 - 126 U/L - - -  AST 15 - 41 U/L - - -  ALT 0 - 44 U/L - - -    Imaging studies: New abdominal x-ray today shows improved bowel dilation.   Assessment/Plan:  55 y.o. male with abdominal internal hernia 7 Day Post-Op s/p reduction and repair.  Patient today with less distention.  X-ray shows improved dilation.   Potassium within normal limits today.  Wound keep NGT clamped and assess for toleration.  We will continue with clear liquid trial.  Patient ambulating.  We will continue with DVT prophylaxis.  We will continue with pain management.  Arnold Long, MD

## 2021-06-08 NOTE — Progress Notes (Signed)
PHARMACY CONSULT NOTE - FOLLOW UP  Pharmacy Consult for Electrolyte Monitoring and Replacement   Recent Labs: Potassium (mmol/L)  Date Value  06/08/2021 3.5   Magnesium (mg/dL)  Date Value  06/06/2021 2.4   Calcium (mg/dL)  Date Value  06/08/2021 9.5   Albumin (g/dL)  Date Value  06/01/2021 4.1  11/28/2019 4.9   Phosphorus (mg/dL)  Date Value  06/05/2021 3.4   Sodium (mmol/L)  Date Value  06/08/2021 147 (H)  11/28/2019 140     Assessment: 55 year old male with abdominal hernia s/p reduction and repair. Patient with postoperative ileus. Pharmacy consult to replace electrolytes. K improved from 3.0 to 3.5 today. Yesterday received Kcl 40 mEq x 2 in addition to Kcl 20 meq in fluids running 75 mL/hr.   Goal of Therapy:  K WNL Mg WNL  Plan:  No replacement today.  F/u BMP in the AM.   Wynelle Cleveland, PharmD Pharmacy Resident  06/08/2021 10:23 AM

## 2021-06-09 LAB — BASIC METABOLIC PANEL
Anion gap: 8 (ref 5–15)
BUN: 17 mg/dL (ref 6–20)
CO2: 30 mmol/L (ref 22–32)
Calcium: 9 mg/dL (ref 8.9–10.3)
Chloride: 107 mmol/L (ref 98–111)
Creatinine, Ser: 0.74 mg/dL (ref 0.61–1.24)
GFR, Estimated: >60 mL/min (ref >60)
Glucose, Bld: 105 mg/dL — ABNORMAL HIGH (ref 70–99)
Potassium: 3.7 mmol/L (ref 3.5–5.1)
Sodium: 145 mmol/L (ref 135–145)

## 2021-06-09 MED ORDER — LORAZEPAM 1 MG PO TABS
1.0000 mg | ORAL_TABLET | Freq: Four times a day (QID) | ORAL | Status: DC | PRN
  Administered 2021-06-09 – 2021-06-10 (×3): 1 mg via ORAL
  Filled 2021-06-09 (×3): qty 1

## 2021-06-09 NOTE — Progress Notes (Signed)
Patient ID: William [name], male   DOB: 1966-01-26, 55 y.o.   MRN: [id number]     [place] Hospital Day(s): 8.   Interval History: Patient seen and examined, no acute events or new complaints overnight. Patient reports had an episode of nausea after Dilaudid.  Otherwise he felt much better today.  Not passing as much gas as before.  Vital signs in last 24 hours: [min-max] current  Temp:  [97.5 F (36.4 C)-98.6 F (37 C)] 97.5 F (36.4 C) (08/25 1504) Pulse Rate:  [81-86] 83 (08/25 1504) Resp:  [15-18] 18 (08/25 1504) BP: (136-150)/(89-100) 136/89 (08/25 1504) SpO2:  [97 %-100 %] 97 % (08/25 1504)     Height: '6\' 1"'$  (185.4 cm) Weight: 91.6 kg BMI (Calculated): 26.66   Physical Exam:  Constitutional: alert, cooperative and no distress  Respiratory: breathing non-labored at rest  Cardiovascular: regular rate and sinus rhythm  Gastrointestinal: soft, non-tender, and non-distended.  Wound is dry and clean  Labs:  CBC Latest Ref Rng & Units 06/07/2021 06/05/2021 06/03/2021  WBC 4.0 - 10.5 K/uL 7.9 6.6 6.4  Hemoglobin 13.0 - 17.0 g/dL 13.9 14.3 14.7  Hematocrit 39.0 - 52.0 % 41.0 40.8 42.2  Platelets 150 - 400 K/uL 353 332 255   CMP Latest Ref Rng & Units 06/09/2021 06/08/2021 06/07/2021  Glucose 70 - 99 mg/dL 105(H) 114(H) 104(H)  BUN 6 - 20 mg/dL 17 19 26(H)  Creatinine 0.61 - 1.24 mg/dL 0.74 0.77 0.91  Sodium 135 - 145 mmol/L 145 147(H) 147(H)  Potassium 3.5 - 5.1 mmol/L 3.7 3.5 3.0(L)  Chloride 98 - 111 mmol/L 107 106 104  CO2 22 - 32 mmol/L '30 28 30  '$ Calcium 8.9 - 10.3 mg/dL 9.0 9.5 8.9  Total Protein 6.5 - 8.1 g/dL - - -  Total Bilirubin 0.3 - 1.2 mg/dL - - -  Alkaline Phos 38 - 126 U/L - - -  AST 15 - 41 U/L - - -  ALT 0 - 44 U/L - - -    Imaging studies: No new pertinent imaging studies   Assessment/Plan:  55 y.o. male with abdominal internal hernia 8 Day Post-Op s/p reduction and repair.  Patient without any clinical evaluation.  Continue with postoperative  ileus.  Potassium today 3.7.  Still passing gas but not as much as last time.  X-ray yesterday shows improved radiological signs of postoperative ileus.  Not significantly better today.  We will continue with clamp trial on clear liquids.  We will not remove NGT until we have a better intestinal function.  Patient ambulating.  We will continue with DVT prophylaxis.  We will try to avoid opioids.  Arnold Long, MD

## 2021-06-09 NOTE — Progress Notes (Signed)
PHARMACY CONSULT NOTE - FOLLOW UP  Pharmacy Consult for Electrolyte Monitoring and Replacement   Recent Labs: Potassium (mmol/L)  Date Value  06/09/2021 3.7   Magnesium (mg/dL)  Date Value  06/06/2021 2.4   Calcium (mg/dL)  Date Value  06/09/2021 9.0   Albumin (g/dL)  Date Value  06/01/2021 4.1  11/28/2019 4.9   Phosphorus (mg/dL)  Date Value  06/05/2021 3.4   Sodium (mmol/L)  Date Value  06/09/2021 145  11/28/2019 140     Assessment: 55 year old male with abdominal hernia s/p reduction and repair. Patient with postoperative ileus. Pharmacy consult to replace electrolytes. K improved from 3.0 to 3.5 today. On 8/23 received Kcl 40 mEq x 2 in addition to Kcl 20 meq in fluids running 75 mL/hr.   K WNL x 2 days  Goal of Therapy:  K WNL Mg WNL  Plan:  No replacement today. BMP x 1 more day to confirm WNL labs.   Wynelle Cleveland, PharmD Pharmacy Resident  06/09/2021 8:58 AM

## 2021-06-10 LAB — BASIC METABOLIC PANEL
Anion gap: 8 (ref 5–15)
BUN: 15 mg/dL (ref 6–20)
CO2: 29 mmol/L (ref 22–32)
Calcium: 8.8 mg/dL — ABNORMAL LOW (ref 8.9–10.3)
Chloride: 109 mmol/L (ref 98–111)
Creatinine, Ser: 0.79 mg/dL (ref 0.61–1.24)
GFR, Estimated: >60 mL/min (ref >60)
Glucose, Bld: 100 mg/dL — ABNORMAL HIGH (ref 70–99)
Potassium: 3.9 mmol/L (ref 3.5–5.1)
Sodium: 146 mmol/L — ABNORMAL HIGH (ref 135–145)

## 2021-06-10 LAB — CBC
HCT: 40.7 % (ref 39.0–52.0)
Hemoglobin: 14 g/dL (ref 13.0–17.0)
MCH: 31 pg (ref 26.0–34.0)
MCHC: 34.4 g/dL (ref 30.0–36.0)
MCV: 90 fL (ref 80.0–100.0)
Platelets: 344 10*3/uL (ref 150–400)
RBC: 4.52 MIL/uL (ref 4.22–5.81)
RDW: 13.2 % (ref 11.5–15.5)
WBC: 9.9 10*3/uL (ref 4.0–10.5)
nRBC: 0 % (ref 0.0–0.2)

## 2021-06-10 MED ORDER — FAMOTIDINE 20 MG IN NS 100 ML IVPB
20.0000 mg | Freq: Every morning | INTRAVENOUS | Status: DC
  Filled 2021-06-10: qty 100

## 2021-06-10 MED ORDER — METOCLOPRAMIDE HCL 5 MG/ML IJ SOLN
10.0000 mg | Freq: Two times a day (BID) | INTRAMUSCULAR | Status: DC
  Administered 2021-06-10 – 2021-06-11 (×2): 10 mg via INTRAVENOUS
  Filled 2021-06-10 (×2): qty 2

## 2021-06-10 MED ORDER — FAMOTIDINE IN NACL 20-0.9 MG/50ML-% IV SOLN
20.0000 mg | Freq: Every morning | INTRAVENOUS | Status: AC
  Administered 2021-06-10: 20 mg via INTRAVENOUS
  Filled 2021-06-10 (×2): qty 50

## 2021-06-10 MED ORDER — FAMOTIDINE 20 MG IN NS 100 ML IVPB
20.0000 mg | Freq: Every morning | INTRAVENOUS | Status: DC
  Filled 2021-06-10 (×3): qty 100

## 2021-06-10 NOTE — Progress Notes (Signed)
PHARMACY CONSULT NOTE - FOLLOW UP  Pharmacy Consult for Electrolyte Monitoring and Replacement   Recent Labs: Potassium (mmol/L)  Date Value  06/10/2021 3.9   Magnesium (mg/dL)  Date Value  06/06/2021 2.4   Calcium (mg/dL)  Date Value  06/10/2021 8.8 (L)   Albumin (g/dL)  Date Value  06/01/2021 4.1  11/28/2019 4.9   Phosphorus (mg/dL)  Date Value  06/05/2021 3.4   Sodium (mmol/L)  Date Value  06/10/2021 146 (H)  11/28/2019 140     Assessment: 55 year old male with abdominal hernia s/p reduction and repair. Patient with postoperative ileus. Pharmacy consult to replace electrolytes. K improved from 3.0 to 3.5 today. On 8/23 received Kcl 40 mEq x 2 in addition to Kcl 20 meq in fluids running 75 mL/hr.   K WNL x 3 days. Per surgery, continuing IVF while encouraging adequate PO hydration.  Goal of Therapy:  K WNL Mg WNL  Plan:  No replacement again today. F/u PO hydration and BMP daily.  Wynelle Cleveland, PharmD Pharmacy Resident  06/10/2021 7:33 AM

## 2021-06-10 NOTE — Progress Notes (Signed)
Patient ID: William [name], male   DOB: 11/09/1965, 55 y.o.   MRN: [id number]     [place] Hospital Day(s): 9.   Interval History: Patient seen and examined, no acute events or new complaints overnight. Patient reports had 3 bowel movements since last night.  Feeling very comfortable.  Denies pain.  Vital signs in last 24 hours: [min-max] current  Temp:  [97.5 F (36.4 C)-98.5 F (36.9 C)] 98.1 F (36.7 C) (08/26 0759) Pulse Rate:  [83-98] 89 (08/26 0759) Resp:  [16-18] 16 (08/26 0759) BP: (136-141)/(89-98) 140/95 (08/26 0759) SpO2:  [97 %] 97 % (08/26 0759)     Height: '6\' 1"'$  (185.4 cm) Weight: 91.6 kg BMI (Calculated): 26.66   Physical Exam:  Constitutional: alert, cooperative and no distress  Respiratory: breathing non-labored at rest  Cardiovascular: regular rate and sinus rhythm  Gastrointestinal: soft, non-tender, and non-distended  Labs:  CBC Latest Ref Rng & Units 06/10/2021 06/07/2021 06/05/2021  WBC 4.0 - 10.5 K/uL 9.9 7.9 6.6  Hemoglobin 13.0 - 17.0 g/dL 14.0 13.9 14.3  Hematocrit 39.0 - 52.0 % 40.7 41.0 40.8  Platelets 150 - 400 K/uL 344 353 332   CMP Latest Ref Rng & Units 06/10/2021 06/09/2021 06/08/2021  Glucose 70 - 99 mg/dL 100(H) 105(H) 114(H)  BUN 6 - 20 mg/dL '15 17 19  '$ Creatinine 0.61 - 1.24 mg/dL 0.79 0.74 0.77  Sodium 135 - 145 mmol/L 146(H) 145 147(H)  Potassium 3.5 - 5.1 mmol/L 3.9 3.7 3.5  Chloride 98 - 111 mmol/L 109 107 106  CO2 22 - 32 mmol/L '29 30 28  '$ Calcium 8.9 - 10.3 mg/dL 8.8(L) 9.0 9.5  Total Protein 6.5 - 8.1 g/dL - - -  Total Bilirubin 0.3 - 1.2 mg/dL - - -  Alkaline Phos 38 - 126 U/L - - -  AST 15 - 41 U/L - - -  ALT 0 - 44 U/L - - -    Imaging studies: No new pertinent imaging studies   Assessment/Plan:  55 y.o. male with abdominal internal hernia 9 Day Post-Op s/p reduction and repair.  Ileus resolving.  Patient had a good bowel movement.  NGT removed.  Advance diet to full liquids.  We will assess toleration.  We will  continue pain management.  We will continue DVT prophylaxis.  Patient ambulating.  Arnold Long, MD

## 2021-06-11 LAB — BASIC METABOLIC PANEL
Anion gap: 7 (ref 5–15)
BUN: 19 mg/dL (ref 6–20)
CO2: 25 mmol/L (ref 22–32)
Calcium: 8.8 mg/dL — ABNORMAL LOW (ref 8.9–10.3)
Chloride: 107 mmol/L (ref 98–111)
Creatinine, Ser: 0.83 mg/dL (ref 0.61–1.24)
GFR, Estimated: >60 mL/min (ref >60)
Glucose, Bld: 92 mg/dL (ref 70–99)
Potassium: 3.6 mmol/L (ref 3.5–5.1)
Sodium: 139 mmol/L (ref 135–145)

## 2021-06-11 MED ORDER — FAMOTIDINE 20 MG PO TABS
20.0000 mg | ORAL_TABLET | Freq: Every day | ORAL | Status: DC
  Administered 2021-06-11: 20 mg via ORAL
  Filled 2021-06-11: qty 1

## 2021-06-11 MED ORDER — HYDROCODONE-ACETAMINOPHEN 5-325 MG PO TABS: 1.0000 | ORAL_TABLET | ORAL | 0 refills | Status: AC | PRN

## 2021-06-11 MED ORDER — POTASSIUM CHLORIDE CRYS ER 20 MEQ PO TBCR
20.0000 meq | EXTENDED_RELEASE_TABLET | Freq: Once | ORAL | Status: AC
  Administered 2021-06-11: 20 meq via ORAL
  Filled 2021-06-11: qty 1

## 2021-06-11 MED ORDER — ONDANSETRON HCL 4 MG PO TABS: 4.0000 mg | ORAL_TABLET | Freq: Every day | ORAL | 1 refills | Status: DC | PRN

## 2021-06-11 NOTE — Plan of Care (Signed)
  Problem: Education: Goal: Knowledge of General Education information will improve Description: Including pain rating scale, medication(s)/side effects and non-pharmacologic comfort measures Outcome: Adequate for Discharge   Problem: Health Behavior/Discharge Planning: Goal: Ability to manage health-related needs will improve Outcome: Adequate for Discharge   Problem: Clinical Measurements: Goal: Ability to maintain clinical measurements within normal limits will improve Outcome: Adequate for Discharge Goal: Will remain free from infection Outcome: Adequate for Discharge Goal: Diagnostic test results will improve Outcome: Adequate for Discharge Goal: Respiratory complications will improve Outcome: Adequate for Discharge Goal: Cardiovascular complication will be avoided Outcome: Adequate for Discharge   Problem: Activity: Goal: Risk for activity intolerance will decrease Outcome: Adequate for Discharge   Problem: Nutrition: Goal: Adequate nutrition will be maintained Outcome: Adequate for Discharge   Problem: Coping: Goal: Level of anxiety will decrease Outcome: Adequate for Discharge   Problem: Elimination: Goal: Will not experience complications related to bowel motility Outcome: Adequate for Discharge Goal: Will not experience complications related to urinary retention Outcome: Adequate for Discharge   Problem: Pain Managment: Goal: General experience of comfort will improve Outcome: Adequate for Discharge   Problem: Safety: Goal: Ability to remain free from injury will improve Outcome: Adequate for Discharge   Problem: Skin Integrity: Goal: Risk for impaired skin integrity will decrease Outcome: Adequate for Discharge   Problem: Education: Goal: Understanding of discharge needs will improve Outcome: Adequate for Discharge Goal: Verbalization of understanding of the causes of altered bowel function will improve Outcome: Adequate for Discharge   Problem:  Activity: Goal: Ability to tolerate increased activity will improve Outcome: Adequate for Discharge   Problem: Bowel/Gastric: Goal: Gastrointestinal status for postoperative course will improve Outcome: Adequate for Discharge   Problem: Health Behavior/Discharge Planning: Goal: Identification of community resources to assist with postoperative recovery needs will improve Outcome: Adequate for Discharge   Problem: Nutritional: Goal: Will attain and maintain optimal nutritional status will improve Outcome: Adequate for Discharge   Problem: Clinical Measurements: Goal: Postoperative complications will be avoided or minimized Outcome: Adequate for Discharge   Problem: Respiratory: Goal: Respiratory status will improve Outcome: Adequate for Discharge   Problem: Skin Integrity: Goal: Will show signs of wound healing Outcome: Adequate for Discharge

## 2021-06-11 NOTE — Discharge Instructions (Signed)

## 2021-06-11 NOTE — Progress Notes (Signed)
Pt's PIV removed with tip intact. Discharge instructions explained with pt. Pt denies any questions or concerns. Pt to pick up meds from home pharmacy. Pt advised to follow up with surgeon in office. Pt and wife agreeable to discharge plan.

## 2021-06-11 NOTE — Progress Notes (Signed)
PHARMACY CONSULT NOTE - FOLLOW UP  Pharmacy Consult for Electrolyte Monitoring and Replacement   Recent Labs: Potassium (mmol/L)  Date Value  06/11/2021 3.6   Magnesium (mg/dL)  Date Value  06/06/2021 2.4   Calcium (mg/dL)  Date Value  06/11/2021 8.8 (L)   Albumin (g/dL)  Date Value  06/01/2021 4.1  11/28/2019 4.9   Phosphorus (mg/dL)  Date Value  06/05/2021 3.4   Sodium (mmol/L)  Date Value  06/11/2021 139  11/28/2019 140     Assessment: 55 year old male with abdominal hernia s/p reduction and repair. Patient with postoperative ileus. Pharmacy consult to replace electrolytes. K improved from 3.0 to 3.5 today. On 8/23 received Kcl 40 mEq x 2 in addition to Kcl 20 meq in fluids running 75 mL/hr.   K 3.6 today  Goal of Therapy:  K WNL Mg WNL  Plan:  Will give KCL 20 meq po x 1   F/u PO hydration and BMP daily.  Lu Duffel, PharmD, BCPS Clinical Pharmacist 06/11/2021 7:46 AM

## 2021-06-11 NOTE — Discharge Summary (Signed)
Patient ID: William [name] MRN: [id number] DOB/AGE: [birth year] 55 y.o.  Admit date: 06/01/2021 Discharge date: 06/11/2021   Discharge Diagnoses:  Active Problems:   Volvulus of intestine (HCC)   Procedures: Exploratory laparotomy with internal hernia reduction and repair  Hospital Course: Patient with closed-loop small bowel obstruction due to internal hernia.  He underwent emergent laparotomy with internal hernia reduction and repair.  He developed expected postoperative ileus.  He recovered slowly but adequately.  The wound is dry and clean.  Patient is tolerating diet.  Patient is ambulating.  Pain controlled.  Physical Exam HENT:     Head: Normocephalic.  Cardiovascular:     Rate and Rhythm: Normal rate and regular rhythm.  Pulmonary:     Effort: Pulmonary effort is normal.  Abdominal:     General: Abdomen is flat. Bowel sounds are normal.     Palpations: Abdomen is soft.  Skin:    General: Skin is warm.  Neurological:     Mental Status: He is alert and oriented to person, place, and time.     Consults: None  Disposition: Discharge disposition: 01-Home or Self Care       Discharge Instructions     Diet - low sodium heart healthy   Complete by: As directed    Increase activity slowly   Complete by: As directed       Allergies as of 06/11/2021       Reactions   Augmentin [amoxicillin-pot Clavulanate] Diarrhea   Acyclovir And Related Swelling   Mouth swelling and sores   Biaxin [clarithromycin]    Headache and vomiting,sores in mouth   Flomax [tamsulosin Hcl]    Headache, dizziness   Other Nausea And Vomiting   anesthesia        Medication List     TAKE these medications    ALIVE MENS ENERGY PO Take 1 tablet by mouth daily.   amLODipine 10 MG tablet Commonly known as: NORVASC Take 1 tablet (10 mg total) by mouth at bedtime.   fluticasone 50 MCG/ACT nasal spray Commonly known as: FLONASE Use 2 spray(s) in each nostril once daily    HYDROcodone-acetaminophen 5-325 MG tablet Commonly known as: Norco Take 1 tablet by mouth every 4 (four) hours as needed for up to 3 days for moderate pain.   hyoscyamine 0.125 MG tablet Commonly known as: Levsin Take 1 tablet (0.125 mg total) by mouth every 6 (six) hours as needed for cramping.   linaclotide 145 MCG Caps capsule Commonly known as: Linzess Take 1 capsule (145 mcg total) by mouth daily before breakfast.   losartan 25 MG tablet Commonly known as: COZAAR Take 25 mg by mouth daily.   lovastatin 40 MG tablet Commonly known as: MEVACOR Take 1 tablet (40 mg total) by mouth at bedtime.   melatonin 3 MG Tabs tablet Take 6 mg by mouth at bedtime.   neomycin-polymyxin-hydrocortisone 3.5-10000-1 OTIC suspension Commonly known as: CORTISPORIN Place 3 drops in the affected ear four times daily What changed:  how much to take how to take this when to take this reasons to take this additional instructions   omeprazole 40 MG capsule Commonly known as: PRILOSEC Take 1 capsule (40 mg total) by mouth daily.   ondansetron 4 MG disintegrating tablet Commonly known as: Zofran ODT Take 1 tablet (4 mg total) by mouth every 8 (eight) hours as needed for nausea or vomiting.   ondansetron 4 MG tablet Commonly known as: Zofran Take 1 tablet (4 mg total) by mouth  daily as needed for nausea or vomiting.   polyethylene glycol 17 g packet Commonly known as: MIRALAX / GLYCOLAX Take 17 g by mouth 2 (two) times daily.   SAMBUCUS ELDERBERRY PO Take 1 tablet by mouth daily.   sucralfate 1 g tablet Commonly known as: CARAFATE Take 1 g by mouth 4 (four) times daily -  before meals and at bedtime.   tadalafil 5 MG tablet Commonly known as: CIALIS Take 1 tablet (5 mg total) by mouth daily.   traMADol 50 MG tablet Commonly known as: ULTRAM Take 50 mg by mouth every 4 (four) hours as needed for pain.   vardenafil 20 MG tablet Commonly known as: LEVITRA Take 1 tablet (20 mg  total) by mouth daily as needed for erectile dysfunction.   zolpidem 12.5 MG CR tablet Commonly known as: AMBIEN CR Take 12.5 mg by mouth at bedtime as needed for sleep.        Follow-up Information     Herbert Pun, MD Follow up on 06/16/2021.   Specialty: General Surgery Why: For suture removal, For wound re-check.  Call office on Monday for confirmation of appointment. Contact information: [place] [address] [phone]

## 2021-06-28 DIAGNOSIS — Z0000 Encounter for general adult medical examination without abnormal findings: Secondary | ICD-10-CM | POA: Diagnosis not present

## 2021-06-28 DIAGNOSIS — D649 Anemia, unspecified: Secondary | ICD-10-CM | POA: Diagnosis not present

## 2021-06-28 DIAGNOSIS — I10 Essential (primary) hypertension: Secondary | ICD-10-CM | POA: Diagnosis not present

## 2021-06-28 DIAGNOSIS — Z125 Encounter for screening for malignant neoplasm of prostate: Secondary | ICD-10-CM | POA: Diagnosis not present

## 2021-06-28 DIAGNOSIS — E785 Hyperlipidemia, unspecified: Secondary | ICD-10-CM | POA: Diagnosis not present

## 2021-06-28 DIAGNOSIS — Z13220 Encounter for screening for lipoid disorders: Secondary | ICD-10-CM | POA: Diagnosis not present

## 2021-06-28 DIAGNOSIS — Z1389 Encounter for screening for other disorder: Secondary | ICD-10-CM | POA: Diagnosis not present

## 2021-07-11 DIAGNOSIS — J40 Bronchitis, not specified as acute or chronic: Secondary | ICD-10-CM | POA: Diagnosis not present

## 2021-07-28 DIAGNOSIS — I10 Essential (primary) hypertension: Secondary | ICD-10-CM | POA: Diagnosis not present

## 2021-07-28 DIAGNOSIS — D649 Anemia, unspecified: Secondary | ICD-10-CM | POA: Diagnosis not present

## 2021-08-05 DIAGNOSIS — Z23 Encounter for immunization: Secondary | ICD-10-CM | POA: Diagnosis not present

## 2021-08-11 DIAGNOSIS — M25511 Pain in right shoulder: Secondary | ICD-10-CM | POA: Diagnosis not present

## 2021-08-11 DIAGNOSIS — M6281 Muscle weakness (generalized): Secondary | ICD-10-CM | POA: Diagnosis not present

## 2021-08-11 DIAGNOSIS — M25611 Stiffness of right shoulder, not elsewhere classified: Secondary | ICD-10-CM | POA: Diagnosis not present

## 2021-08-29 ENCOUNTER — Ambulatory Visit (INDEPENDENT_AMBULATORY_CARE_PROVIDER_SITE_OTHER): Payer: BC Managed Care – PPO | Admitting: Urology

## 2021-08-29 ENCOUNTER — Other Ambulatory Visit: Payer: Self-pay

## 2021-08-29 ENCOUNTER — Encounter: Payer: Self-pay | Admitting: Urology

## 2021-08-29 VITALS — BP 136/93 | HR 91 | Temp 98.4°F

## 2021-08-29 DIAGNOSIS — R351 Nocturia: Secondary | ICD-10-CM | POA: Diagnosis not present

## 2021-08-29 DIAGNOSIS — N5201 Erectile dysfunction due to arterial insufficiency: Secondary | ICD-10-CM | POA: Diagnosis not present

## 2021-08-29 DIAGNOSIS — N400 Enlarged prostate without lower urinary tract symptoms: Secondary | ICD-10-CM

## 2021-08-29 MED ORDER — TADALAFIL 5 MG PO TABS: 5.0000 mg | ORAL_TABLET | Freq: Every day | ORAL | 11 refills | Status: DC

## 2021-08-29 NOTE — Progress Notes (Signed)

## 2021-08-29 NOTE — Progress Notes (Signed)
08/29/2021 9:34 AM   William [name] 1966-07-09 [id number]  Referring provider: Erven Colla, DO [place],  [place] [address]  Followup BPh and erectile dysfunction   HPI: Mr Drewes is a 55yo here for followup for BPH and erectile dysfunction. IPSS 8 QOL 2 on tadalafil 5mg  daily. Nocturia 1-2x depending on fluid comsumption. Urine stream strong. He still has mild nasal congestion with tadalafil. He he has good erections with tadlafil 5mg  daily. No other complaints today   PMH: Past Medical History:  Diagnosis Date   COVID    10-09-2019   Difficulty sleeping    TAKES ADVIL PM   Diverticulitis of intestine with perforation and abscess    Diverticulosis    Dysuria    ED (erectile dysfunction)    Family history of adverse reaction to anesthesia    sister  and mom PONV   GERD (gastroesophageal reflux disease)    Headache    Hyperlipidemia    Hypertension    Osteoarthritis    Pancreatitis, acute    PONV (postoperative nausea and vomiting)    Prostatitis    Reflux     Surgical History: Past Surgical History:  Procedure Laterality Date   ABCESS DRAINAGE     ABDOMINAL   APPENDECTOMY     CHOLECYSTECTOMY     COLONOSCOPY N/A 06/09/2015   Procedure: COLONOSCOPY;  Surgeon: Daneil Dolin, MD;  [facility];  Service: Endoscopy;  Laterality: N/A;  11:30 Am   HERNIA REPAIR     KNEE SURGERY     Both   LAPAROSCOPIC LYSIS OF ADHESIONS N/A 08/19/2020   Procedure: LAPAROSCOPIC LYSIS OF ADHESIONS;  Surgeon: Armandina Gemma, MD;  Location: [facility];  Service: General;  Laterality: N/A;   LAPAROSCOPIC SIGMOID COLECTOMY N/A 11/17/2013   Procedure: LAPAROSCOPIC SIGMOID COLECTOMY rigid proctoscopy;  Surgeon: Edward Jolly, MD;  Location: [facility];  Service: General;  Laterality: N/A;   LAPAROSCOPY N/A 08/19/2020   Procedure: LAPAROSCOPY DIAGNOSTIC AND OPEN LEFT INGIUNAL HERNIA REPAIR WITH MESH;  Surgeon: Armandina Gemma, MD;  Location: [facility];  Service: General;   Laterality: N/A;   LAPAROTOMY N/A 06/01/2021   Procedure: EXPLORATORY LAPAROTOMY;  Surgeon: Herbert Pun, MD;  Location: [facility] ORS;  Service: General;  Laterality: N/A;   SHOULDER ARTHROSCOPY     x3   SHOULDER SURGERY Left May 2016   SPHINCTEROTOMY     for sphincter of Oddi dysfunction   TONSILLECTOMY      Home Medications:  Allergies as of 08/29/2021       Reactions   Augmentin [amoxicillin-pot Clavulanate] Diarrhea   Acyclovir And Related Swelling   Mouth swelling and sores   Biaxin [clarithromycin]    Headache and vomiting,sores in mouth   Flomax [tamsulosin Hcl]    Headache, dizziness   Other Nausea And Vomiting   anesthesia        Medication List        Accurate as of August 29, 2021  9:34 AM. If you have any questions, ask your nurse or doctor.          STOP taking these medications    hyoscyamine 0.125 MG tablet Commonly known as: Levsin Stopped by: Nicolette Bang, MD   linaclotide 145 MCG Caps capsule Commonly known as: Linzess Stopped by: Nicolette Bang, MD   melatonin 3 MG Tabs tablet Stopped by: Nicolette Bang, MD   ondansetron 4 MG disintegrating tablet Commonly known as: Zofran ODT Stopped by: Nicolette Bang, MD  ondansetron 4 MG tablet Commonly known as: Zofran Stopped by: Nicolette Bang, MD   polyethylene glycol 17 g packet Commonly known as: MIRALAX / GLYCOLAX Stopped by: Nicolette Bang, MD   sucralfate 1 g tablet Commonly known as: CARAFATE Stopped by: Nicolette Bang, MD   vardenafil 20 MG tablet Commonly known as: LEVITRA Stopped by: Nicolette Bang, MD       TAKE these medications    ALIVE MENS ENERGY PO Take 1 tablet by mouth daily.   amLODipine 10 MG tablet Commonly known as: NORVASC Take 1 tablet (10 mg total) by mouth at bedtime.   fluticasone 50 MCG/ACT nasal spray Commonly known as: FLONASE Use 2 spray(s) in each nostril once daily   losartan 25 MG tablet Commonly known as:  COZAAR Take 25 mg by mouth daily.   lovastatin 40 MG tablet Commonly known as: MEVACOR Take 1 tablet (40 mg total) by mouth at bedtime.   neomycin-polymyxin-hydrocortisone 3.5-10000-1 OTIC suspension Commonly known as: CORTISPORIN Place 3 drops in the affected ear four times daily What changed:  how much to take how to take this when to take this reasons to take this additional instructions   omeprazole 40 MG capsule Commonly known as: PRILOSEC Take 1 capsule (40 mg total) by mouth daily.   SAMBUCUS ELDERBERRY PO Take 1 tablet by mouth daily.   tadalafil 5 MG tablet Commonly known as: CIALIS Take 1 tablet (5 mg total) by mouth daily.   traMADol 50 MG tablet Commonly known as: ULTRAM Take 50 mg by mouth every 4 (four) hours as needed for pain.   zolpidem 12.5 MG CR tablet Commonly known as: AMBIEN CR Take 12.5 mg by mouth at bedtime as needed for sleep.        Allergies:  Allergies  Allergen Reactions   Augmentin [Amoxicillin-Pot Clavulanate] Diarrhea   Acyclovir And Related Swelling    Mouth swelling and sores   Biaxin [Clarithromycin]     Headache and vomiting,sores in mouth   Flomax [Tamsulosin Hcl]     Headache, dizziness   Other Nausea And Vomiting    anesthesia     Family History: Family History  Problem Relation Age of Onset   Cancer Father        ? Colon mass removed    Ulcerative colitis Sister    Colon cancer Maternal Grandmother        5s   Esophageal cancer Neg Hx    Pancreatic cancer Neg Hx    Stomach cancer Neg Hx     Social History:  reports that he quit smoking about 34 years ago. His smoking use included cigarettes. He has never used smokeless tobacco. He reports current alcohol use of about 3.0 standard drinks per week. He reports that he does not use drugs.  ROS: All other review of systems were reviewed and are negative except what is noted above in HPI  Physical Exam: BP (!) 136/93   Pulse 91   Temp 98.4 F (36.9 C)    Constitutional:  Alert and oriented, No acute distress. HEENT: [place] AT, moist mucus membranes.  Trachea midline, no masses. Cardiovascular: No clubbing, cyanosis, or edema. Respiratory: Normal respiratory effort, no increased work of breathing. GI: Abdomen is soft, nontender, nondistended, no abdominal masses GU: No CVA tenderness.  Lymph: No cervical or inguinal lymphadenopathy. Skin: No rashes, bruises or suspicious lesions. Neurologic: Grossly intact, no focal deficits, moving all 4 extremities. Psychiatric: Normal mood and affect.  Laboratory Data: Lab Results  Component  Value Date   WBC 9.9 06/10/2021   HGB 14.0 06/10/2021   HCT 40.7 06/10/2021   MCV 90.0 06/10/2021   PLT 344 06/10/2021    Lab Results  Component Value Date   CREATININE 0.83 06/11/2021    Lab Results  Component Value Date   PSA 1.65 04/06/2014    No results found for: TESTOSTERONE  No results found for: HGBA1C  Urinalysis    Component Value Date/Time   COLORURINE YELLOW (A) 06/01/2021 1223   APPEARANCEUR CLEAR (A) 06/01/2021 1223   APPEARANCEUR Clear 02/23/2021 1106   LABSPEC 1.034 (H) 06/01/2021 1223   PHURINE 5.0 06/01/2021 1223   GLUCOSEU NEGATIVE 06/01/2021 1223   HGBUR NEGATIVE 06/01/2021 1223   [place] 06/01/2021 1223   BILIRUBINUR Negative 02/23/2021 1106   KETONESUR 5 (A) 06/01/2021 1223   PROTEINUR 100 (A) 06/01/2021 1223   UROBILINOGEN 0.2 11/27/2013 1124   NITRITE NEGATIVE 06/01/2021 1223   LEUKOCYTESUR NEGATIVE 06/01/2021 1223    Lab Results  Component Value Date   LABMICR See below: 02/23/2021   WBCUA None seen 02/23/2021   LABEPIT None seen 02/23/2021   BACTERIA NONE SEEN 06/01/2021    Pertinent Imaging:  Results for orders placed during the hospital encounter of 09/21/05  DG Abd 1 View  Narrative Clinical Data: Evaluate for retained pancreatic duct stent. ABDOMEN - 1 VIEW - 09/21/05: (Additional 1 view abdomen obtained to include the entire  abdomen). Findings: Negative for retained pancreatic duct stent. Unremarkable bowel gas pattern.  Impression No evidence of retained pancreatic duct stent.  Provider: Jennye Boroughs  No results found for this or any previous visit.  No results found for this or any previous visit.  No results found for this or any previous visit.  No results found for this or any previous visit.  No results found for this or any previous visit.  No results found for this or any previous visit.  No results found for this or any previous visit.   Assessment & Plan:    1. Erectile dysfunction due to arterial insufficiency -Continue tadalafil 5mg  daily  2. Nocturia -Continue tadalafil 5mg  daily  3. Benign prostatic hyperplasia, unspecified whether lower urinary tract symptoms present -Continue tadalafil 5mg  daily   No follow-ups on file.  Nicolette Bang, MD  [facility] [place]

## 2021-08-29 NOTE — Patient Instructions (Signed)

## 2021-09-01 DIAGNOSIS — M25552 Pain in left hip: Secondary | ICD-10-CM | POA: Diagnosis not present

## 2021-09-01 DIAGNOSIS — M5136 Other intervertebral disc degeneration, lumbar region: Secondary | ICD-10-CM | POA: Diagnosis not present

## 2021-09-05 DIAGNOSIS — M19011 Primary osteoarthritis, right shoulder: Secondary | ICD-10-CM | POA: Diagnosis not present

## 2021-09-10 ENCOUNTER — Other Ambulatory Visit: Payer: Self-pay | Admitting: Family Medicine

## 2021-09-13 DIAGNOSIS — M791 Myalgia, unspecified site: Secondary | ICD-10-CM | POA: Diagnosis not present

## 2021-09-13 DIAGNOSIS — Z03818 Encounter for observation for suspected exposure to other biological agents ruled out: Secondary | ICD-10-CM | POA: Diagnosis not present

## 2021-09-28 DIAGNOSIS — K219 Gastro-esophageal reflux disease without esophagitis: Secondary | ICD-10-CM | POA: Diagnosis not present

## 2021-09-28 DIAGNOSIS — R0789 Other chest pain: Secondary | ICD-10-CM | POA: Diagnosis not present

## 2021-09-28 DIAGNOSIS — E785 Hyperlipidemia, unspecified: Secondary | ICD-10-CM | POA: Diagnosis not present

## 2021-09-28 DIAGNOSIS — I10 Essential (primary) hypertension: Secondary | ICD-10-CM | POA: Diagnosis not present

## 2021-10-05 DIAGNOSIS — Z03818 Encounter for observation for suspected exposure to other biological agents ruled out: Secondary | ICD-10-CM | POA: Diagnosis not present

## 2021-10-05 DIAGNOSIS — R051 Acute cough: Secondary | ICD-10-CM | POA: Diagnosis not present

## 2021-10-06 ENCOUNTER — Other Ambulatory Visit: Payer: Self-pay | Admitting: Family Medicine

## 2021-12-29 ENCOUNTER — Telehealth: Payer: Self-pay | Admitting: Internal Medicine

## 2021-12-29 NOTE — Telephone Encounter (Signed)
Pt states that Dr. Hilarie Fredrickson helped him in the past by finding a  hernia. He later had hernia surgery. Later he had surgery for several blockages. Now he has noticed 2 bulges on his abdomen that were not there. He saw his PCP and he states that the PCP told him he needed to get back in to see Dr. Hilarie Fredrickson. Pt scheduled to see Dr. Hilarie Fredrickson 01/03/22 at 3:20pm. Pt aware of appt. ?

## 2021-12-29 NOTE — Telephone Encounter (Signed)
Inbound call from patient requesting a call back please.  States he noticed 2 bulges in his abdomen that were not there a week ago. ?

## 2021-12-30 ENCOUNTER — Encounter: Payer: Self-pay | Admitting: *Deleted

## 2022-01-03 ENCOUNTER — Ambulatory Visit: Payer: BC Managed Care – PPO | Admitting: Internal Medicine

## 2022-01-03 ENCOUNTER — Encounter: Payer: Self-pay | Admitting: Internal Medicine

## 2022-01-03 VITALS — BP 126/86 | HR 82 | Ht 72.0 in | Wt 208.0 lb

## 2022-01-03 DIAGNOSIS — K432 Incisional hernia without obstruction or gangrene: Secondary | ICD-10-CM

## 2022-01-03 NOTE — Patient Instructions (Signed)
Please follow up as needed. ? ?If you are age 58 or older, your body mass index should be between 23-30. Your Body mass index is 28.21 kg/m?Marland Kitchen If this is out of the aforementioned range listed, please consider follow up with your Primary Care Provider. ? ?If you are age 58 or younger, your body mass index should be between 19-25. Your Body mass index is 28.21 kg/m?Marland Kitchen If this is out of the aformentioned range listed, please consider follow up with your Primary Care Provider.  ? ?________________________________________________________ ? ?The [facility] providers would like to encourage you to use [facility] to communicate with providers for non-urgent requests or questions.  Due to long hold times on the telephone, sending your provider a message by [facility] may be a faster and more efficient way to get a response.  Please allow 48 business hours for a response.  Please remember that this is for non-urgent requests.  ?_______________________________________________________ ? ?Due to recent changes in healthcare laws, you may see the results of your imaging and laboratory studies on MyChart before your provider has had a chance to review them.  We understand that in some cases there may be results that are confusing or concerning to you. Not all laboratory results come back in the same time frame and the provider may be waiting for multiple results in order to interpret others.  Please give [place] 48 hours in order for your provider to thoroughly review all the results before contacting the office for clarification of your results.  ? ?

## 2022-01-03 NOTE — Progress Notes (Signed)
? ?  Subjective:  ? ? Patient ID: William [name], male    DOB: 06-Dec-1965, 56 y.o.   MRN: [id number] ? ?HPI ?William [name] is a 56 yo male with PMH of colonic diverticulosis with complicated diverticulitis status post sigmoid resection in 2015, lysis of adhesions and repair of left inguinal hernia with mesh in November 2021, closed-loop SBO secondary to an internal hernia status post ex lap 7/41/2878 complicated by postoperative ileus, remote sphincterotomy for sphincter of Oddi dysfunction, history of GERD, BPH, hypertension and hyperlipidemia who is here for follow-up.  He is here alone today. ? ?I last saw him for endoscopies in September 2021. ? ?He had the small bowel obstruction in August 2022.  This required emergency exploratory laparotomy.  The postoperative course was complicated by ileus and he ended up losing over 35 pounds in just under 3 weeks.  He slowly recovered and is feeling much better. ? ?Most recently he has been noticing a bulge which comes and goes in the mid abdomen.  He wonders if this is going to be a problem.  Seems to bother him more with activity.  Pain is not severe but he notices discomfort when the bulge is most prominent.  He is focused on high-fiber and protein diet.  Bowel movements have been regular.  No blood in stool or melena.  No upper GI or hepatobiliary complaint. ? ? ?Review of Systems ?As per HPI, otherwise negative ? ?Current Medications, Allergies, Past Medical History, Past Surgical History, Family History and Social History were reviewed in [employer] record. ?   ?Objective:  ? Physical Exam ?BP 126/86   Pulse 82   Ht 6' (1.829 m)   Wt 208 lb (94.3 kg)   BMI 28.21 kg/m?  ?Gen: awake, alert, NAD ?HEENT: anicteric  ?CV: RRR, no mrg ?Pulm: CTA b/l ?Abd: soft, NT/ND, ex-lap scar has well-healed, soft incisional hernia 2 cm above umbilicus from scar extending 3 cm left laterally, +BS throughout, diastasis recti ?Ext: no c/c/e ?Neuro: nonfocal ? ? ?    ?Assessment & Plan:  ?56 yo male with PMH of colonic diverticulosis with complicated diverticulitis status post sigmoid resection in 2015, lysis of adhesions and repair of left inguinal hernia with mesh in November 2021, closed-loop SBO secondary to an internal hernia status post ex lap 6/76/7209 complicated by postoperative ileus, remote sphincterotomy for sphincter of Oddi dysfunction, history of GERD, BPH, hypertension and hyperlipidemia who is here for follow-up. ? ?Ventral incisional hernia --the "bulge" that he is feeling on the abdominal wall is an incisional hernia.  This is just superior and lateral to the umbilicus.  At this point I recommend monitoring though if this is causing symptoms on a regular basis, restricting activity in any significant way, then I will recommend he revisit with general surgery for consideration of repair.  We discussed this at length today.  He will keep an eye on it and let me know ?--Incisional ventral hernia; observation; surgical consult for repair of symptomatic ? ?2.  Nonadvanced adenomatous colon polyps --surveillance colonoscopy recommended 7 years after his last which would be around September 2028 ? ?Follow-up as needed ? ?30 minutes total spent today including patient facing time, coordination of care, reviewing medical history/procedures/pertinent radiology studies, and documentation of the encounter. ? ? ?

## 2022-02-16 ENCOUNTER — Other Ambulatory Visit: Payer: Self-pay | Admitting: Orthopaedic Surgery

## 2022-02-16 DIAGNOSIS — M25512 Pain in left shoulder: Secondary | ICD-10-CM

## 2022-02-21 ENCOUNTER — Inpatient Hospital Stay: Admission: RE | Admit: 2022-02-21 | Payer: BC Managed Care – PPO | Source: Ambulatory Visit

## 2022-02-23 ENCOUNTER — Other Ambulatory Visit: Payer: Self-pay | Admitting: Family Medicine

## 2022-02-28 ENCOUNTER — Other Ambulatory Visit: Payer: Self-pay

## 2022-02-28 ENCOUNTER — Emergency Department
Admission: EM | Admit: 2022-02-28 | Discharge: 2022-02-28 | Disposition: A | Discharge status: Home / Self Care | Payer: BC Managed Care – PPO | Attending: Emergency Medicine | Admitting: Emergency Medicine

## 2022-02-28 ENCOUNTER — Encounter: Payer: Self-pay | Admitting: Emergency Medicine

## 2022-02-28 ENCOUNTER — Emergency Department: Payer: BC Managed Care – PPO

## 2022-02-28 DIAGNOSIS — I10 Essential (primary) hypertension: Secondary | ICD-10-CM | POA: Diagnosis not present

## 2022-02-28 DIAGNOSIS — Z8616 Personal history of COVID-19: Secondary | ICD-10-CM | POA: Diagnosis not present

## 2022-02-28 DIAGNOSIS — R1084 Generalized abdominal pain: Secondary | ICD-10-CM | POA: Diagnosis not present

## 2022-02-28 DIAGNOSIS — R197 Diarrhea, unspecified: Secondary | ICD-10-CM | POA: Diagnosis present

## 2022-02-28 LAB — COMPREHENSIVE METABOLIC PANEL
ALT: 66 U/L — ABNORMAL HIGH (ref 0–44)
AST: 72 U/L — ABNORMAL HIGH (ref 15–41)
Albumin: 4.8 g/dL (ref 3.5–5.0)
Alkaline Phosphatase: 78 U/L (ref 38–126)
Anion gap: 11 (ref 5–15)
BUN: 34 mg/dL — ABNORMAL HIGH (ref 6–20)
CO2: 20 mmol/L — ABNORMAL LOW (ref 22–32)
Calcium: 9.6 mg/dL (ref 8.9–10.3)
Chloride: 106 mmol/L (ref 98–111)
Creatinine, Ser: 0.84 mg/dL (ref 0.61–1.24)
GFR, Estimated: >60 mL/min (ref >60)
Glucose, Bld: 128 mg/dL — ABNORMAL HIGH (ref 70–99)
Potassium: 4.1 mmol/L (ref 3.5–5.1)
Sodium: 137 mmol/L (ref 135–145)
Total Bilirubin: 0.9 mg/dL (ref 0.3–1.2)
Total Protein: 8.2 g/dL — ABNORMAL HIGH (ref 6.5–8.1)

## 2022-02-28 LAB — CBC
HCT: 49.1 % (ref 39.0–52.0)
Hemoglobin: 16.6 g/dL (ref 13.0–17.0)
MCH: 28.9 pg (ref 26.0–34.0)
MCHC: 33.8 g/dL (ref 30.0–36.0)
MCV: 85.5 fL (ref 80.0–100.0)
Platelets: 261 10*3/uL (ref 150–400)
RBC: 5.74 MIL/uL (ref 4.22–5.81)
RDW: 13.6 % (ref 11.5–15.5)
WBC: 13.6 10*3/uL — ABNORMAL HIGH (ref 4.0–10.5)
nRBC: 0 % (ref 0.0–0.2)

## 2022-02-28 LAB — URINALYSIS, ROUTINE W REFLEX MICROSCOPIC
Bacteria, UA: NONE SEEN
Bilirubin Urine: NEGATIVE
Glucose, UA: NEGATIVE mg/dL
Hgb urine dipstick: NEGATIVE
Ketones, ur: NEGATIVE mg/dL
Leukocytes,Ua: NEGATIVE
Nitrite: NEGATIVE
Protein, ur: 30 mg/dL — AB
Specific Gravity, Urine: 1.03 (ref 1.005–1.030)
Squamous Epithelial / HPF: NONE SEEN (ref 0–5)
pH: 5 (ref 5.0–8.0)

## 2022-02-28 LAB — LIPASE, BLOOD: Lipase: 24 U/L (ref 11–51)

## 2022-02-28 MED ORDER — ONDANSETRON HCL 4 MG/2ML IJ SOLN
4.0000 mg | Freq: Once | INTRAMUSCULAR | Status: AC
  Administered 2022-02-28: 4 mg via INTRAVENOUS
  Filled 2022-02-28: qty 2

## 2022-02-28 MED ORDER — IOHEXOL 300 MG/ML  SOLN
100.0000 mL | Freq: Once | INTRAMUSCULAR | Status: AC | PRN
  Administered 2022-02-28: 100 mL via INTRAVENOUS

## 2022-02-28 MED ORDER — MORPHINE SULFATE (PF) 4 MG/ML IV SOLN
4.0000 mg | Freq: Once | INTRAVENOUS | Status: AC
  Administered 2022-02-28: 4 mg via INTRAVENOUS
  Filled 2022-02-28: qty 1

## 2022-02-28 MED ORDER — ACETAMINOPHEN 10 MG/ML IV SOLN
1000.0000 mg | Freq: Once | INTRAVENOUS | Status: AC
  Administered 2022-02-28: 1000 mg via INTRAVENOUS
  Filled 2022-02-28: qty 100

## 2022-02-28 MED ORDER — SODIUM CHLORIDE 0.9 % IV BOLUS
1000.0000 mL | Freq: Once | INTRAVENOUS | Status: AC
Start: 2022-02-28 — End: 2022-02-28
  Administered 2022-02-28: 1000 mL via INTRAVENOUS

## 2022-02-28 NOTE — ED Notes (Signed)
Pt back from CT

## 2022-02-28 NOTE — Discharge Instructions (Signed)
Your CT scan reveals diarrheal illness without obstruction or other intra-abdominal abnormalities.  You have provided stool cultures, we will notify you if these require treatment when they return.  In the meantime, please remember to stay hydrated.  Please return for any new, worsening, or change in symptoms or other concerns.  It was a pleasure caring for you today. ?

## 2022-02-28 NOTE — ED Notes (Signed)
Pt to CT

## 2022-02-28 NOTE — ED Triage Notes (Signed)
Pt to ED from home c/o mid abd pain that has progressed today.  Had abd surgery last August.  States has been dx by GI with incisional hernia that will need to be laparoscopically repaired.  States now having n/v/d, body aches, abd pressure with bloating, and belching.  Last normal BM yesterday.  Pt A&Ox4, chest rise even and unlabored, in NAD at this time. ?

## 2022-02-28 NOTE — ED Provider Notes (Signed)
[facility] Provider Note    Event Date/Time   First MD Initiated Contact with Patient 02/28/22 2119     (approximate)   History   Abdominal Pain   HPI  William [name] is a 56 y.o. male with a past medical history of volvulus of intestine, hyperlipidemia, diverticulitis, intra-abdominal abscess who presents today for evaluation of abdominal pain.  Per chart review, patient had a volvulus of his intestine, and a closed-loop small bowel obstruction due to internal hernia.  He underwent emergent laparotomy with internal hernia reduction and repair.  Patient reports that he had been doing well until the last couple of days he has developed abdominal discomfort and bloating.  He reports that his bloating gets better after he burps.  He initially attributed his symptoms to eating too much during his daughter's graduation from college, however his symptoms have persisted.  He reports that he has had belching and nausea. He also reports that he has had loose stools.  He reports that he drove home from [place] and had to stop several times for diarrhea.  He is concerned because he also had diarrhea when he was diagnosed with his volvulus.  No fevers or chills.  No urinary symptoms.  Patient Active Problem List   Diagnosis Date Noted   Volvulus of intestine ([place]) 06/01/2021   Erectile dysfunction due to arterial insufficiency 02/23/2021   Nocturia 02/23/2021   Left lower quadrant abdominal pain 08/19/2020   Abdominal pain, left lower quadrant 08/15/2020   History of COVID-19 04/14/2020   Hypertension 03/13/2017   Chronic pain of both shoulders 09/13/2016   Benign prostatic hyperplasia 04/21/2016   Depression 07/22/2015   Generalized anxiety disorder 07/22/2015   Special screening for malignant neoplasms, colon    Insomnia 05/18/2015   Hyperlipidemia LDL goal <130 12/08/2014   Postoperative abdominal pain 11/22/2013   Diverticulitis of sigmoid colon 11/17/2013    Intra-abdominal abscess ([place]) 10/08/2013   Diverticulitis with perforation 10/08/2013   Diverticulitis of colon with perforation 09/22/2013   Other and unspecified hyperlipidemia 01/09/2013   Gastro-esophageal reflux 01/09/2013   Epididymitis 01/09/2013          Physical Exam   Triage Vital Signs: ED Triage Vitals  Enc Vitals Group     BP 02/28/22 2023 112/85     Pulse Rate 02/28/22 2023 (!) 120     Resp 02/28/22 2023 18     Temp 02/28/22 2023 97.9 F (36.6 C)     Temp Source 02/28/22 2023 Oral     SpO2 02/28/22 2023 92 %     Weight 02/28/22 2046 205 lb (93 kg)     Height 02/28/22 2046 '6\' 1"'$  (1.854 m)     Head Circumference --      Peak Flow --      Pain Score 02/28/22 2046 5     Pain Loc --      Pain Edu? --      Excl. in [place]? --     Most recent vital signs: Vitals:   02/28/22 2023 02/28/22 2204  BP: 112/85 124/83  Pulse: (!) 120 98  Resp: 18 18  Temp: 97.9 F (36.6 C)   SpO2: 92% 95%    Physical Exam Vitals and nursing note reviewed.  Constitutional:      General: Awake and alert. No acute distress.    Appearance: Normal appearance. He is well-developed and normal weight.  HENT:     Head: Normocephalic and atraumatic.  Mouth/Throat:     Mouth: Mucous membranes are moist.  Eyes:     General: PERRL. Normal EOMs        Right eye: No discharge.        Left eye: No discharge.     Conjunctiva/sclera: Conjunctivae normal.  Cardiovascular:     Rate and Rhythm: Normal rate and regular rhythm.     Pulses: Normal pulses.     Heart sounds: Normal heart sounds Pulmonary:     Effort: Pulmonary effort is normal. No respiratory distress.     Breath sounds: Normal breath sounds.  Abdominal:     Abdomen is soft. There is diffuse abdominal tenderness though primarily left-sided no rebound or guarding. No appreciable distention though the patient reports distended from his baseline.  Large well-healed surgical incision Musculoskeletal:        General: No swelling.  Normal range of motion.     Cervical back: Normal range of motion and neck supple.  Lymphadenopathy:     Cervical: No cervical adenopathy.  Skin:    General: Skin is warm and dry.     Capillary Refill: Capillary refill takes less than 2 seconds.     Findings: No rash.  Neurological:     Mental Status: He is alert.      ED Results / Procedures / Treatments   Labs (all labs ordered are listed, but only abnormal results are displayed) Labs Reviewed  COMPREHENSIVE METABOLIC PANEL - Abnormal; Notable for the following components:      Result Value   CO2 20 (*)    Glucose, Bld 128 (*)    BUN 34 (*)    Total Protein 8.2 (*)    AST 72 (*)    ALT 66 (*)    All other components within normal limits  CBC - Abnormal; Notable for the following components:   WBC 13.6 (*)    All other components within normal limits  URINALYSIS, ROUTINE W REFLEX MICROSCOPIC - Abnormal; Notable for the following components:   Color, Urine YELLOW (*)    APPearance CLEAR (*)    Protein, ur 30 (*)    All other components within normal limits  STOOL CULTURE  OVA + PARASITE EXAM  LIPASE, BLOOD     EKG     RADIOLOGY I reviewed imaging and agree with radiologist findings   PROCEDURES:  Critical Care performed:   Procedures   MEDICATIONS ORDERED IN ED: Medications  ondansetron (ZOFRAN) injection 4 mg (4 mg Intravenous Given 02/28/22 2200)  sodium chloride 0.9 % bolus 1,000 mL (0 mLs Intravenous Stopped 02/28/22 2258)  acetaminophen (OFIRMEV) IV 1,000 mg (0 mg Intravenous Stopped 02/28/22 2233)  morphine (PF) 4 MG/ML injection 4 mg (4 mg Intravenous Given 02/28/22 2201)  iohexol (OMNIPAQUE) 300 MG/ML solution 100 mL (100 mLs Intravenous Contrast Given 02/28/22 2210)     IMPRESSION / MDM / ASSESSMENT AND PLAN / ED COURSE  I reviewed the triage vital signs and the nursing notes.  Differential diagnosis includes, but is not limited to, small bowel obstruction, gastroenteritis, recurrence of  volvulus, partial SBO.  Patient presented to the emergency department tachycardic to 120 though normotensive.  Labs were obtained in triage which revealed leukocytosis to 13.  CT scan ordered, and I called radiology tech to get this expedited given concerning abdominal surgical history.  In the meantime, he was treated symptomatically with IV fluids, morphine, and Zofran.  Upon reevaluation, patient reports that he feels significantly improved.  CT scan demonstrates  findings consistent with diarrheal illness without volvulus or obstruction.  Patient is reassured by these findings.  Repeat heart rate after patient returned from CT normalized. Recommended stool sample and patient is able and willing to provide this.  There is no blood in his stool.  He has not been on any recent antibiotics to suggest C. difficile.  Lab reports that the stool cultures are send out and will not result tonight.  Patient feels comfortable going home.  We discussed the importance of staying hydrated, as well as strict return precautions.  Patient understands and agrees with plan.  He was discharged in stable condition.  Clinical Course as of 02/28/22 2303  Tue Feb 28, 2022  2255 Lab reports that stool tests are send out tests and will not result tonight. Patient feels comfortable going home [JP]    Clinical Course User Index [JP] Yvonna Brun, Clarnce Flock, PA-C     FINAL CLINICAL IMPRESSION(S) / ED DIAGNOSES   Final diagnoses:  Diarrhea, unspecified type     Rx / DC Orders   ED Discharge Orders     None        Note:  This document was prepared using Dragon voice recognition software and may include unintentional dictation errors.   Emeline Gins 02/28/22 2306    Blake Divine, MD 03/06/22 [phone]

## 2022-03-06 ENCOUNTER — Other Ambulatory Visit: Payer: BC Managed Care – PPO

## 2022-03-23 ENCOUNTER — Telehealth: Payer: Self-pay | Admitting: Internal Medicine

## 2022-03-23 NOTE — Telephone Encounter (Signed)
Pt calling regarding incisional hernia, states he was told by Dr. Norman Herrlich he would get him on the schedule asap. See note from last office visit and advise.  Ventral incisional hernia --the "bulge" that he is feeling on the abdominal wall is an incisional hernia.  This is just superior and lateral to the umbilicus.  At this point I recommend monitoring though if this is causing symptoms on a regular basis, restricting activity in any significant way, then I will recommend he revisit with general surgery for consideration of repair.  We discussed this at length today.  He will keep an eye on it and let me know --Incisional ventral hernia; observation; surgical consult for repair of symptomatic

## 2022-03-23 NOTE — Telephone Encounter (Signed)
Spoke with pt and he wants to know which surgeon in particular Dr. Hilarie Fredrickson would recommend to do the surgery. States he has been to [facility] in the past but the 2 he saw are no longer there. Please advise.

## 2022-03-23 NOTE — Telephone Encounter (Signed)
I think the best person for him to see his general surgery because they are the ones that would actually repair such a hernia That said, if he really wants my opinion then you can add him on at 1120 AM on 03/28/2022

## 2022-03-23 NOTE — Telephone Encounter (Signed)
Inbound call from patient stating he has a large intestinal hernia .patient states he spoke with the doctor and he recommended he could get him on the schedule as soon as possible. Please give patient a call back to advise.  Thank You

## 2022-03-24 ENCOUNTER — Telehealth: Payer: Self-pay | Admitting: Internal Medicine

## 2022-03-24 NOTE — Telephone Encounter (Signed)
Patient called stating that [facility] needed a referral for his upcoming surgery.  It will be to Angie at fax number [phone].  Thank you.

## 2022-03-24 NOTE — Telephone Encounter (Signed)
I believe Dr. Harlow Asa is still practicing If not can refer to Dr. Rosendo Gros or Dr. Thermon Leyland for abd hernia repair

## 2022-03-24 NOTE — Telephone Encounter (Signed)
Spoke with pt and he is aware of recommendations per Dr. Hilarie Fredrickson.

## 2022-03-27 NOTE — Telephone Encounter (Signed)
Referral faxed as requested

## 2022-04-06 ENCOUNTER — Ambulatory Visit
Admission: RE | Admit: 2022-04-06 | Discharge: 2022-04-06 | Disposition: A | Discharge status: Home / Self Care | Payer: BC Managed Care – PPO | Source: Ambulatory Visit | Attending: Orthopaedic Surgery | Admitting: Orthopaedic Surgery

## 2022-04-06 DIAGNOSIS — M25512 Pain in left shoulder: Secondary | ICD-10-CM

## 2022-04-25 ENCOUNTER — Other Ambulatory Visit: Payer: Self-pay

## 2022-04-25 ENCOUNTER — Encounter: Payer: Self-pay | Admitting: Internal Medicine

## 2022-04-25 MED ORDER — HYOSCYAMINE SULFATE 0.125 MG SL SUBL: 0.1250 mg | SUBLINGUAL_TABLET | Freq: Four times a day (QID) | SUBLINGUAL | 1 refills | Status: DC | PRN

## 2022-04-25 NOTE — Telephone Encounter (Signed)
Okay for hyoscyamine 0.125 mg 1 to 2 tablets sublingual every 6-8 hours as needed for abdominal cramping JMP

## 2022-05-03 ENCOUNTER — Other Ambulatory Visit: Payer: Self-pay | Admitting: Student

## 2022-05-03 DIAGNOSIS — K439 Ventral hernia without obstruction or gangrene: Secondary | ICD-10-CM

## 2022-05-10 ENCOUNTER — Ambulatory Visit
Admission: RE | Admit: 2022-05-10 | Discharge: 2022-05-10 | Disposition: A | Discharge status: Home / Self Care | Payer: BC Managed Care – PPO | Source: Ambulatory Visit | Attending: Student | Admitting: Student

## 2022-05-10 DIAGNOSIS — K439 Ventral hernia without obstruction or gangrene: Secondary | ICD-10-CM | POA: Insufficient documentation

## 2022-05-10 LAB — POCT I-STAT CREATININE: Creatinine, Ser: 0.9 mg/dL (ref 0.61–1.24)

## 2022-05-10 MED ORDER — IOHEXOL 300 MG/ML  SOLN
100.0000 mL | Freq: Once | INTRAMUSCULAR | Status: AC | PRN
  Administered 2022-05-10: 100 mL via INTRAVENOUS

## 2022-05-11 ENCOUNTER — Encounter: Payer: Self-pay | Admitting: Urology

## 2022-05-11 NOTE — Telephone Encounter (Signed)
Sounds like he would need to see general surgery, right?

## 2022-05-16 ENCOUNTER — Ambulatory Visit: Payer: Self-pay | Admitting: General Surgery

## 2022-05-16 NOTE — H&P (Unsigned)
History of Present Illness: William [name] is a 56 y.o. male who is seen today as an office consultation at the request of Dr. Pasty Arch for evaluation of Recheck Hernia .     Patient follows back up today.  He has had some increased pain to the left inguinal area.  He did recently undergo CT scan.  This does show a fat-containing hernia.  He states that he has been constipated, taking MiraLAX which has been helping.  He has noticed some swelling, distention to the left inguinal area.   Patient continues to have an upper midline hernia in the umbilicus.   Patient states that secondary to pain to left inguinal hernia and it getting larger and more uncomfortable we will like to have this repaired.       Review of Systems: A complete review of systems was obtained from the patient.  I have reviewed this information and discussed as appropriate with the patient.  See HPI as well for other ROS.   Review of Systems  Constitutional:  Negative for fever.  HENT:  Negative for congestion.   Eyes:  Negative for blurred vision.  Respiratory:  Negative for cough, shortness of breath and wheezing.   Cardiovascular:  Negative for chest pain and palpitations.  Gastrointestinal:  Positive for abdominal pain. Negative for heartburn.  Genitourinary:  Negative for dysuria.  Musculoskeletal:  Negative for myalgias.  Skin:  Negative for rash.  Neurological:  Negative for dizziness and headaches.  Psychiatric/Behavioral:  Negative for depression and suicidal ideas.   All other systems reviewed and are negative.      Medical History: Past Medical History Past Medical History: Diagnosis Date  Abdominal hernia    Allergy    Arthritis    Fracture    GERD (gastroesophageal reflux disease)    Hyperlipidemia    Hypertension    Liver disease        Patient Active Problem List Diagnosis  Essential hypertension  Hyperlipidemia  Insomnia  Chronic pain of both shoulders  Gastroesophageal reflux  disease     Past Surgical History Past Surgical History: Procedure Laterality Date  EXPLORATORY LAPAROTOMY   06/01/2021   Dr Lesli Albee  APPENDECTOMY      CHOLECYSTECTOMY      COLON SURGERY      HERNIA REPAIR      KNEE ARTHROSCOPY      shoulder surgery          Allergies Allergies Allergen Reactions  Amoxicillin-Pot Clavulanate Diarrhea  Biaxin [Clarithromycin] Swelling  Other Nausea And Vomiting     anesthesia  Sulfa (Sulfonamide Antibiotics) Swelling  Tamsulosin Hcl Headache     Headache, dizziness      Current Outpatient Medications on File Prior to Visit Medication Sig Dispense Refill  amLODIPine (NORVASC) 10 MG tablet Take 1 tablet (10 mg total) by mouth once daily 30 tablet 11  ascorbic acid-elderberry fruit 100-50 mg Chew Take 2 tablets by mouth once daily      fluticasone propionate (FLONASE) 50 mcg/actuation nasal spray Place 2 sprays into both nostrils once daily 16 g 2  losartan (COZAAR) 50 MG tablet Take 1 tablet (50 mg total) by mouth once daily 30 tablet 11  lovastatin (MEVACOR) 40 MG tablet TAKE 1 TABLET BY MOUTH ONCE DAILY WITH SUPPER 90 tablet 3  multivitamin capsule Take 2 capsules by mouth once daily      neomycin-polymyxin-hydrocortisone (CORTISPORIN) otic suspension Place 1 drop into both ears once daily as needed  omeprazole (PRILOSEC) 40 MG DR capsule Take 1 capsule by mouth once daily 90 capsule 3  tadalafiL (CIALIS) 5 MG tablet Take 5 mg by mouth once daily      zolpidem (AMBIEN) 10 mg tablet Take 1 tablet (10 mg total) by mouth at bedtime as needed for Sleep for up to 180 days 30 tablet 5   No current facility-administered medications on file prior to visit.     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 Physical Exam Constitutional:      Appearance: Normal appearance.  HENT:     Head: Normocephalic and atraumatic.     Nose: Nose normal. No congestion.     Mouth/Throat:     Mouth: Mucous membranes are moist.     Pharynx: Oropharynx is clear.  Eyes:     Pupils: Pupils are equal, round, and reactive to light.  Cardiovascular:     Rate and Rhythm: Normal rate and regular rhythm.     Pulses: Normal pulses.     Heart sounds: Normal heart sounds. No murmur heard.   No friction rub. No gallop.  Pulmonary:     Effort: Pulmonary effort is normal. No respiratory distress.     Breath sounds: Normal breath sounds. No stridor. No wheezing, rhonchi or rales.  Abdominal:     General: Abdomen is flat.     Hernia: A hernia is present. Hernia is present in the left inguinal area. There is no hernia in the right inguinal area.        Comments: 3cm  Musculoskeletal:        General: Normal range of motion.     Cervical back: Normal range of motion.  Skin:    General: Skin is warm and dry.  Neurological:     General: No focal deficit present.     Mental Status: He is alert and oriented to person, place, and time.  Psychiatric:        Mood and Affect: Mood normal.        Thought Content: Thought content normal.        Assessment and  Plan: Diagnoses and all orders for this visit:   Ventral hernia without obstruction or gangrene   Recurrent left inguinal hernia     William [name] is a 56 y.o. male    I believe it is reasonable to attempt a laparoscopic left inguinal hernia repair with mesh.   It is also possible we may get away with  a laparoscopic incisional hernia repair with mesh based on adhesions.  I did d/w pt that he may require open surgery  1.  We will proceed to the OR for a laparoscopic versus open left inguinal hernia repair with mesh and an incisional hernia repair with mesh. 2. All risks and benefits were discussed with the patient, to generally include infection, bleeding, damage to surrounding structures, acute and chronic nerve pain, and recurrence. Alternatives were offered and described.  All questions were answered and the patient voiced understanding of the procedure and wishes to proceed at this point.             No follow-ups on file.   [name], MD, [facility], [facility]

## 2022-05-17 NOTE — Telephone Encounter (Signed)
Patient asking about his CT prostate size, please advise

## 2022-06-26 NOTE — Pre-Procedure Instructions (Signed)
Surgical Instructions    Your procedure is scheduled on Thursday, July 06, 2022 at 7:30 AM.  Report to [facility] Main Entrance "A" at 5:30 A.M., then check in with the Admitting office.  Call this number if you have problems the morning of surgery:  [phone]   If you have any questions prior to your surgery date call [phone]: Open Monday-Friday 8am-4pm    Remember:  Do not eat after midnight the night before your surgery  You may drink clear liquids until 4:30 AM the morning of your surgery.   Clear liquids allowed are: Water, Non-Citrus Juices (without pulp), Carbonated Beverages, Clear Tea, Black Coffee Only (NO MILK, CREAM OR POWDERED CREAMER of any kind), and Gatorade.  You will be given three (3) PRE-SURGERY ENSURE drinks. Drink two (2) the night before surgery. On the day of surgery, please complete your third ensure by 4:30 AM. Please, if able, drink it in one setting. DO NOT SIP.     Take these medicines the morning of surgery with A SIP OF WATER:  omeprazole (PRILOSEC) fluticasone (FLONASE) - if needed   As of today, STOP taking any Aspirin (unless otherwise instructed by your surgeon) Aleve, Naproxen, Ibuprofen, Motrin, Advil, Goody's, BC's, all herbal medications, fish oil, and all vitamins.                     Do NOT Smoke (Tobacco/Vaping) for 24 hours prior to your procedure.  If you use a CPAP at night, you may bring your mask/headgear for your overnight stay.   Contacts, glasses, piercing's, hearing aid's, dentures or partials may not be worn into surgery, please bring cases for these belongings.    For patients admitted to the hospital, discharge time will be determined by your treatment team.   Patients discharged the day of surgery will not be allowed to drive home, and someone needs to stay with them for 24 hours.  SURGICAL WAITING ROOM VISITATION Patients having surgery or a procedure may have two support people in the waiting  area. Visitors may stay in the waiting area during the procedure and switch out with other visitors if needed. Children under the age of 9 must have an adult accompany them who is not the patient. If the patient needs to stay at the hospital during part of their recovery, the visitor guidelines for inpatient rooms apply.  Please refer to the [facility] website for the visitor guidelines for Inpatients (after your surgery is over and you are in a regular room).    Special instructions:   [place]- Preparing For Surgery  Before surgery, you can play an important role. Because skin is not sterile, your skin needs to be as free of germs as possible. You can reduce the number of germs on your skin by washing with CHG (chlorahexidine gluconate) Soap before surgery.  CHG is an antiseptic cleaner which kills germs and bonds with the skin to continue killing germs even after washing.    Oral Hygiene is also important to reduce your risk of infection.  Remember - BRUSH YOUR TEETH THE MORNING OF SURGERY WITH YOUR REGULAR TOOTHPASTE  Please do not use if you have an allergy to CHG or antibacterial soaps. If your skin becomes reddened/irritated stop using the CHG.  Do not shave (including legs and underarms) for at least 48 hours prior to first CHG shower. It is OK to shave your face.  Please follow these instructions carefully.   Shower the [employer]  SURGERY and the MORNING OF SURGERY  If you chose to wash your hair, wash your hair first as usual with your normal shampoo.  After you shampoo, rinse your hair and body thoroughly to remove the shampoo.  Use CHG Soap as you would any other liquid soap. You can apply CHG directly to the skin and wash gently with a scrungie or a clean washcloth.   Apply the CHG Soap to your body ONLY FROM THE NECK DOWN.  Do not use on open wounds or open sores. Avoid contact with your eyes, ears, mouth and genitals (private parts). Wash Face and genitals (private  parts)  with your normal soap.   Wash thoroughly, paying special attention to the area where your surgery will be performed.  Thoroughly rinse your body with warm water from the neck down.  DO NOT shower/wash with your normal soap after using and rinsing off the CHG Soap.  Pat yourself dry with a CLEAN TOWEL.  Wear CLEAN PAJAMAS to bed the night before surgery  Place CLEAN SHEETS on your bed the night before your surgery  DO NOT SLEEP WITH PETS.   Day of Surgery: Take a shower with CHG soap. Do not wear jewelry. Do not wear lotions, powders, colognes, or deodorant. Do not shave 48 hours prior to surgery.  Men may shave face and neck. Do not bring valuables to the hospital.  [facility] is not responsible for any belongings or valuables. Wear Clean/Comfortable clothing the morning of surgery Do not apply any deodorants/lotions.   Remember to brush your teeth WITH YOUR REGULAR TOOTHPASTE.   Please read over the following fact sheets that you were given.  If you received a COVID test during your pre-op visit  it is requested that you wear a mask when out in public, stay away from anyone that may not be feeling well and notify your surgeon if you develop symptoms. If you have been in contact with anyone that has tested positive in the last 10 days please notify you surgeon.

## 2022-06-27 ENCOUNTER — Encounter (HOSPITAL_COMMUNITY): Payer: Self-pay

## 2022-06-27 ENCOUNTER — Encounter (HOSPITAL_COMMUNITY)
Admission: RE | Admit: 2022-06-27 | Discharge: 2022-06-27 | Disposition: A | Discharge status: Home / Self Care | Payer: BC Managed Care – PPO | Source: Ambulatory Visit | Attending: General Surgery | Admitting: General Surgery

## 2022-06-27 ENCOUNTER — Other Ambulatory Visit: Payer: Self-pay

## 2022-06-27 VITALS — BP 126/91 | HR 73 | Temp 98.9°F | Resp 17 | Ht 73.0 in | Wt 210.6 lb

## 2022-06-27 DIAGNOSIS — I2510 Atherosclerotic heart disease of native coronary artery without angina pectoris: Secondary | ICD-10-CM | POA: Insufficient documentation

## 2022-06-27 DIAGNOSIS — Z01812 Encounter for preprocedural laboratory examination: Secondary | ICD-10-CM | POA: Insufficient documentation

## 2022-06-27 DIAGNOSIS — Z01818 Encounter for other preprocedural examination: Secondary | ICD-10-CM

## 2022-06-27 LAB — BASIC METABOLIC PANEL
Anion gap: 7 (ref 5–15)
BUN: 20 mg/dL (ref 6–20)
CO2: 26 mmol/L (ref 22–32)
Calcium: 9.5 mg/dL (ref 8.9–10.3)
Chloride: 105 mmol/L (ref 98–111)
Creatinine, Ser: 0.93 mg/dL (ref 0.61–1.24)
GFR, Estimated: >60 mL/min (ref >60)
Glucose, Bld: 99 mg/dL (ref 70–99)
Potassium: 4 mmol/L (ref 3.5–5.1)
Sodium: 138 mmol/L (ref 135–145)

## 2022-06-27 LAB — CBC
HCT: 45.4 % (ref 39.0–52.0)
Hemoglobin: 15.8 g/dL (ref 13.0–17.0)
MCH: 30.7 pg (ref 26.0–34.0)
MCHC: 34.8 g/dL (ref 30.0–36.0)
MCV: 88.3 fL (ref 80.0–100.0)
Platelets: 246 10*3/uL (ref 150–400)
RBC: 5.14 MIL/uL (ref 4.22–5.81)
RDW: 13.6 % (ref 11.5–15.5)
WBC: 7.2 10*3/uL (ref 4.0–10.5)
nRBC: 0 % (ref 0.0–0.2)

## 2022-06-27 NOTE — Progress Notes (Signed)
PCP - Dr. Maryland Pink Cardiologist - Denies GI: Dr. Zenovia Jarred  PPM/ICD - Denies  Chest x-ray - N/A EKG - Requested Stress Test - 10/19/21 - Requested ECHO - Denies Cardiac Cath - Denies  Sleep Study - Denies  Diabetes: Denies  Blood Thinner Instructions: N/A Aspirin Instructions: N/A  ERAS Protcol - Yes, PRE-SURGERY Ensure  COVID TEST- N/A   Anesthesia review: Yes, EKG requested from PCP  Patient denies shortness of breath, fever, cough and chest pain at PAT appointment   All instructions explained to the patient, with a verbal understanding of the material. Patient agrees to go over the instructions while at home for a better understanding. Patient also instructed to self quarantine after being tested for COVID-19. The opportunity to ask questions was provided.

## 2022-06-29 NOTE — Progress Notes (Signed)
Anesthesia Chart Review:  Chronic conditions followed by PCP Dr. Maryland Pink at [place] [facility].  Last seen 05/02/2022 for preop evaluation for upcoming shoulder surgery, however, also discussed he was being seen for potential hernia repair..  Per note, "Pre-operative clearance - ECG 12-lead Think patient should be low risk for his upcoming shoulder surgery. Insomnia, unspecified type We will refill his Ambien 10 mg. Essential hypertension: Good control, continue current medication, continue to monitor. Incisional hernia, without obstruction or gangrene: Patient has appointment with surgery for further evaluation."  Patient recently had treadmill stress test 10/19/2021.  Records requested and reviewed.  He achieved a max MET level of 13.2 and max heart rate of 169 representing 103% of maximal age-predicted heart rate.  Exercise test was stopped due to fatigue.  Resting ECG was normal, functional capacity was above average, heart rate and blood pressure spots to exercise were both appropriate.  There were no arrhythmias and no EKG changes from baseline.  Overall impression was normal stress test.  Copy of results on patient chart.  Preop labs reviewed, WNL.  EKG 05/02/2022 (Care Everywhere): NSR.  Rate 12.   Wynonia Musty [facility] Stay [facility]/Anesthesiology Phone [phone] 06/29/2022 2:34 PM

## 2022-06-29 NOTE — Anesthesia Preprocedure Evaluation (Signed)
Anesthesia Evaluation  Patient identified by MRN, date of birth, ID band Patient awake    Reviewed: Allergy & Precautions, NPO status , Patient's Chart, lab work & pertinent test results  History of Anesthesia Complications (+) PONV and history of anesthetic complications  Airway Mallampati: II  TM Distance: >3 FB Neck ROM: Full    Dental no notable dental hx.    Pulmonary neg pulmonary ROS, former smoker,    Pulmonary exam normal breath sounds clear to auscultation       Cardiovascular hypertension, Pt. on medications negative cardio ROS Normal cardiovascular exam Rhythm:Regular Rate:Normal     Neuro/Psych  Headaches, Anxiety Depression negative psych ROS   GI/Hepatic Neg liver ROS, GERD  ,  Endo/Other  negative endocrine ROS  Renal/GU negative Renal ROS  negative genitourinary   Musculoskeletal  (+) Arthritis , Osteoarthritis,    Abdominal   Peds negative pediatric ROS (+)  Hematology negative hematology ROS (+)   Anesthesia Other Findings   Reproductive/Obstetrics negative OB ROS                            Anesthesia Physical Anesthesia Plan  ASA: 2  Anesthesia Plan: General   Post-op Pain Management: Tylenol PO (pre-op)* and Dilaudid IV   Induction: Intravenous  PONV Risk Score and Plan: 3 and Ondansetron, Dexamethasone, Midazolam and Treatment may vary due to age or medical condition  Airway Management Planned: Oral ETT  Additional Equipment:   Intra-op Plan:   Post-operative Plan: Extubation in OR  Informed Consent: I have reviewed the patients History and Physical, chart, labs and discussed the procedure including the risks, benefits and alternatives for the proposed anesthesia with the patient or authorized representative who has indicated his/her understanding and acceptance.     Dental advisory given  Plan Discussed with: CRNA  Anesthesia Plan Comments:  (PAT note by Karoline Caldwell, PA-C: Chronic conditions followed by PCP Dr. Maryland Pink at [place] [facility].  Last seen 05/02/2022 for preop evaluation for upcoming shoulder surgery, however, also discussed he was being seen for potential hernia repair..  Per note, "Pre-operative clearance - ECG 12-lead Think patient should be low risk for his upcoming shoulder surgery. Insomnia, unspecified type We will refill his Ambien 10 mg. Essential hypertension: Good control, continue current medication, continue to monitor. Incisional hernia, without obstruction or gangrene: Patient has appointment with surgery for further evaluation."  Patient recently had treadmill stress test 10/19/2021.  Records requested and reviewed.  He achieved a max MET level of 13.2 and max heart rate of 169 representing 103% of maximal age-predicted heart rate.  Exercise test was stopped due to fatigue.  Resting ECG was normal, functional capacity was above average, heart rate and blood pressure spots to exercise were both appropriate.  There were no arrhythmias and no EKG changes from baseline.  Overall impression was normal stress test.  Copy of results on patient chart.  Preop labs reviewed, WNL.  EKG 05/02/2022 (Care Everywhere): NSR.  Rate 73.  )       Anesthesia Quick Evaluation

## 2022-07-05 MED ORDER — VANCOMYCIN HCL 1500 MG/300ML IV SOLN
1500.0000 mg | INTRAVENOUS | Status: AC
  Administered 2022-07-06: 1500 mg via INTRAVENOUS
  Filled 2022-07-05 (×2): qty 300

## 2022-07-06 ENCOUNTER — Ambulatory Visit (HOSPITAL_COMMUNITY): Payer: BC Managed Care – PPO | Admitting: Certified Registered"

## 2022-07-06 ENCOUNTER — Other Ambulatory Visit: Payer: Self-pay

## 2022-07-06 ENCOUNTER — Encounter (HOSPITAL_COMMUNITY)
Admission: RE | Disposition: A | Discharge status: Home / Self Care | Payer: Self-pay | Source: Home / Self Care | Attending: General Surgery

## 2022-07-06 ENCOUNTER — Encounter (HOSPITAL_COMMUNITY): Payer: Self-pay | Admitting: General Surgery

## 2022-07-06 ENCOUNTER — Ambulatory Visit (HOSPITAL_COMMUNITY): Payer: BC Managed Care – PPO | Admitting: Physician Assistant

## 2022-07-06 ENCOUNTER — Ambulatory Visit (HOSPITAL_COMMUNITY)
Admission: RE | Admit: 2022-07-06 | Discharge: 2022-07-06 | Disposition: A | Discharge status: Home / Self Care | Payer: BC Managed Care – PPO | Source: Home / Self Care | Attending: General Surgery | Admitting: General Surgery

## 2022-07-06 DIAGNOSIS — Z79899 Other long term (current) drug therapy: Secondary | ICD-10-CM | POA: Insufficient documentation

## 2022-07-06 DIAGNOSIS — K4091 Unilateral inguinal hernia, without obstruction or gangrene, recurrent: Secondary | ICD-10-CM | POA: Insufficient documentation

## 2022-07-06 DIAGNOSIS — K432 Incisional hernia without obstruction or gangrene: Secondary | ICD-10-CM | POA: Insufficient documentation

## 2022-07-06 DIAGNOSIS — M1990 Unspecified osteoarthritis, unspecified site: Secondary | ICD-10-CM | POA: Insufficient documentation

## 2022-07-06 DIAGNOSIS — Z87891 Personal history of nicotine dependence: Secondary | ICD-10-CM | POA: Insufficient documentation

## 2022-07-06 DIAGNOSIS — D176 Benign lipomatous neoplasm of spermatic cord: Secondary | ICD-10-CM | POA: Insufficient documentation

## 2022-07-06 DIAGNOSIS — I10 Essential (primary) hypertension: Secondary | ICD-10-CM | POA: Insufficient documentation

## 2022-07-06 DIAGNOSIS — K5900 Constipation, unspecified: Secondary | ICD-10-CM | POA: Insufficient documentation

## 2022-07-06 DIAGNOSIS — Z8249 Family history of ischemic heart disease and other diseases of the circulatory system: Secondary | ICD-10-CM | POA: Insufficient documentation

## 2022-07-06 DIAGNOSIS — K660 Peritoneal adhesions (postprocedural) (postinfection): Secondary | ICD-10-CM | POA: Insufficient documentation

## 2022-07-06 DIAGNOSIS — K219 Gastro-esophageal reflux disease without esophagitis: Secondary | ICD-10-CM | POA: Insufficient documentation

## 2022-07-06 DIAGNOSIS — K56609 Unspecified intestinal obstruction, unspecified as to partial versus complete obstruction: Secondary | ICD-10-CM | POA: Diagnosis not present

## 2022-07-06 HISTORY — PX: INGUINAL HERNIA REPAIR: SHX194

## 2022-07-06 HISTORY — PX: VENTRAL HERNIA REPAIR: SHX424

## 2022-07-06 SURGERY — REPAIR, HERNIA, INGUINAL, LAPAROSCOPIC
Anesthesia: General | Site: Abdomen

## 2022-07-06 MED ORDER — LIDOCAINE 2% (20 MG/ML) 5 ML SYRINGE
INTRAMUSCULAR | Status: AC
  Filled 2022-07-06: qty 5

## 2022-07-06 MED ORDER — HYDROMORPHONE HCL 1 MG/ML IJ SOLN
INTRAMUSCULAR | Status: AC
  Filled 2022-07-06: qty 1

## 2022-07-06 MED ORDER — PROPOFOL 10 MG/ML IV BOLUS
INTRAVENOUS | Status: DC | PRN
  Administered 2022-07-06: 50 mg via INTRAVENOUS
  Administered 2022-07-06: 200 mg via INTRAVENOUS

## 2022-07-06 MED ORDER — HYDROMORPHONE HCL 1 MG/ML IJ SOLN
INTRAMUSCULAR | Status: AC
  Filled 2022-07-06: qty 0.5

## 2022-07-06 MED ORDER — PROPOFOL 10 MG/ML IV BOLUS
INTRAVENOUS | Status: AC
  Filled 2022-07-06: qty 20

## 2022-07-06 MED ORDER — PROMETHAZINE HCL 25 MG/ML IJ SOLN: 6.2500 mg | INTRAMUSCULAR | Status: DC | PRN

## 2022-07-06 MED ORDER — HYDROMORPHONE HCL 1 MG/ML IJ SOLN
0.2500 mg | INTRAMUSCULAR | Status: DC | PRN
  Administered 2022-07-06 (×4): 0.5 mg via INTRAVENOUS

## 2022-07-06 MED ORDER — ACETAMINOPHEN 500 MG PO TABS
ORAL_TABLET | ORAL | Status: AC
  Administered 2022-07-06: 1000 mg
  Filled 2022-07-06: qty 2

## 2022-07-06 MED ORDER — STERILE WATER FOR IRRIGATION IR SOLN
Status: DC | PRN
  Administered 2022-07-06: 1000 mL

## 2022-07-06 MED ORDER — BUPIVACAINE HCL 0.25 % IJ SOLN
INTRAMUSCULAR | Status: DC | PRN
  Administered 2022-07-06: 17 mL

## 2022-07-06 MED ORDER — OXYCODONE HCL 5 MG PO TABS
5.0000 mg | ORAL_TABLET | Freq: Once | ORAL | Status: AC | PRN
  Administered 2022-07-06: 5 mg via ORAL

## 2022-07-06 MED ORDER — MIDAZOLAM HCL 2 MG/2ML IJ SOLN
INTRAMUSCULAR | Status: AC
  Filled 2022-07-06: qty 2

## 2022-07-06 MED ORDER — CHLORHEXIDINE GLUCONATE 0.12 % MT SOLN
15.0000 mL | Freq: Once | OROMUCOSAL | Status: AC
  Administered 2022-07-06: 15 mL via OROMUCOSAL
  Filled 2022-07-06: qty 15

## 2022-07-06 MED ORDER — FENTANYL CITRATE (PF) 250 MCG/5ML IJ SOLN
INTRAMUSCULAR | Status: DC | PRN
  Administered 2022-07-06 (×4): 50 ug via INTRAVENOUS

## 2022-07-06 MED ORDER — ONDANSETRON HCL 4 MG/2ML IJ SOLN
INTRAMUSCULAR | Status: AC
  Filled 2022-07-06: qty 2

## 2022-07-06 MED ORDER — EPHEDRINE SULFATE-NACL 50-0.9 MG/10ML-% IV SOSY
PREFILLED_SYRINGE | INTRAVENOUS | Status: DC | PRN
  Administered 2022-07-06 (×2): 5 mg via INTRAVENOUS
  Administered 2022-07-06: 10 mg via INTRAVENOUS

## 2022-07-06 MED ORDER — BUPIVACAINE-EPINEPHRINE (PF) 0.25% -1:200000 IJ SOLN
INTRAMUSCULAR | Status: AC
  Filled 2022-07-06: qty 60

## 2022-07-06 MED ORDER — ONDANSETRON HCL 4 MG/2ML IJ SOLN
INTRAMUSCULAR | Status: DC | PRN
  Administered 2022-07-06: 4 mg via INTRAVENOUS

## 2022-07-06 MED ORDER — MIDAZOLAM HCL 2 MG/2ML IJ SOLN
INTRAMUSCULAR | Status: DC | PRN
  Administered 2022-07-06: 2 mg via INTRAVENOUS

## 2022-07-06 MED ORDER — PHENYLEPHRINE 80 MCG/ML (10ML) SYRINGE FOR IV PUSH (FOR BLOOD PRESSURE SUPPORT)
PREFILLED_SYRINGE | INTRAVENOUS | Status: DC | PRN
  Administered 2022-07-06 (×2): 80 ug via INTRAVENOUS

## 2022-07-06 MED ORDER — 0.9 % SODIUM CHLORIDE (POUR BTL) OPTIME
TOPICAL | Status: DC | PRN
  Administered 2022-07-06: 1000 mL

## 2022-07-06 MED ORDER — AMISULPRIDE (ANTIEMETIC) 5 MG/2ML IV SOLN: 10.0000 mg | Freq: Once | INTRAVENOUS | Status: DC | PRN

## 2022-07-06 MED ORDER — FENTANYL CITRATE (PF) 250 MCG/5ML IJ SOLN
INTRAMUSCULAR | Status: AC
  Filled 2022-07-06: qty 5

## 2022-07-06 MED ORDER — DEXAMETHASONE SODIUM PHOSPHATE 10 MG/ML IJ SOLN
INTRAMUSCULAR | Status: DC | PRN
  Administered 2022-07-06: 10 mg via INTRAVENOUS

## 2022-07-06 MED ORDER — SCOPOLAMINE 1 MG/3DAYS TD PT72
MEDICATED_PATCH | TRANSDERMAL | Status: DC | PRN
  Administered 2022-07-06: 1 via TRANSDERMAL

## 2022-07-06 MED ORDER — MEPERIDINE HCL 25 MG/ML IJ SOLN: 6.2500 mg | INTRAMUSCULAR | Status: DC | PRN

## 2022-07-06 MED ORDER — LIDOCAINE 2% (20 MG/ML) 5 ML SYRINGE
INTRAMUSCULAR | Status: DC | PRN
  Administered 2022-07-06: 60 mg via INTRAVENOUS

## 2022-07-06 MED ORDER — OXYCODONE-ACETAMINOPHEN 5-325 MG PO TABS: 1.0000 | ORAL_TABLET | ORAL | 0 refills | Status: AC | PRN

## 2022-07-06 MED ORDER — HYDROMORPHONE HCL 1 MG/ML IJ SOLN
0.2500 mg | INTRAMUSCULAR | Status: DC | PRN
  Administered 2022-07-06: 0.5 mg via INTRAVENOUS

## 2022-07-06 MED ORDER — ORAL CARE MOUTH RINSE: 15.0000 mL | Freq: Once | OROMUCOSAL | Status: AC

## 2022-07-06 MED ORDER — HYDROMORPHONE HCL 1 MG/ML IJ SOLN
INTRAMUSCULAR | Status: DC | PRN
  Administered 2022-07-06: .5 mg via INTRAVENOUS

## 2022-07-06 MED ORDER — LACTATED RINGERS IV SOLN: INTRAVENOUS | Status: DC

## 2022-07-06 MED ORDER — ROCURONIUM BROMIDE 10 MG/ML (PF) SYRINGE
PREFILLED_SYRINGE | INTRAVENOUS | Status: DC | PRN
  Administered 2022-07-06: 10 mg via INTRAVENOUS
  Administered 2022-07-06: 70 mg via INTRAVENOUS

## 2022-07-06 MED ORDER — HYDROMORPHONE HCL 1 MG/ML IJ SOLN
0.2500 mg | INTRAMUSCULAR | Status: DC | PRN
  Administered 2022-07-06 (×3): 0.5 mg via INTRAVENOUS

## 2022-07-06 MED ORDER — KETOROLAC TROMETHAMINE 15 MG/ML IJ SOLN
INTRAMUSCULAR | Status: DC | PRN
  Administered 2022-07-06: 15 mg via INTRAVENOUS

## 2022-07-06 MED ORDER — OXYCODONE HCL 5 MG PO TABS
ORAL_TABLET | ORAL | Status: AC
  Filled 2022-07-06: qty 1

## 2022-07-06 MED ORDER — KETOROLAC TROMETHAMINE 30 MG/ML IJ SOLN
INTRAMUSCULAR | Status: AC
  Filled 2022-07-06: qty 1

## 2022-07-06 MED ORDER — OXYCODONE HCL 5 MG/5ML PO SOLN: 5.0000 mg | Freq: Once | ORAL | Status: AC | PRN

## 2022-07-06 MED ORDER — DEXAMETHASONE SODIUM PHOSPHATE 10 MG/ML IJ SOLN
INTRAMUSCULAR | Status: AC
  Filled 2022-07-06: qty 1

## 2022-07-06 MED ORDER — SUGAMMADEX SODIUM 200 MG/2ML IV SOLN
INTRAVENOUS | Status: DC | PRN
  Administered 2022-07-06: 300 mg via INTRAVENOUS

## 2022-07-06 SURGICAL SUPPLY — 69 items
ADH SKN CLS LQ APL DERMABOND (GAUZE/BANDAGES/DRESSINGS) ×4
APL PRP STRL LF DISP 70% ISPRP (MISCELLANEOUS) ×2
APPLIER CLIP 5 13 M/L LIGAMAX5 (MISCELLANEOUS)
APR CLP MED LRG 5 ANG JAW (MISCELLANEOUS)
BAG COUNTER SPONGE SURGICOUNT (BAG) ×2 IMPLANT
BAG SPNG CNTER NS LX DISP (BAG) ×2
CANISTER SUCT 3000ML PPV (MISCELLANEOUS) IMPLANT
CHLORAPREP W/TINT 26 (MISCELLANEOUS) ×2 IMPLANT
CLIP APPLIE 5 13 M/L LIGAMAX5 (MISCELLANEOUS) IMPLANT
COVER SURGICAL LIGHT HANDLE (MISCELLANEOUS) ×2 IMPLANT
DERMABOND ADVANCED .7 DNX6 (GAUZE/BANDAGES/DRESSINGS) IMPLANT
DEVICE SECURE STRAP 25 ABSORB (INSTRUMENTS) ×2 IMPLANT
DEVICE TROCAR PUNCTURE CLOSURE (ENDOMECHANICALS) ×2 IMPLANT
DISSECTOR BLUNT TIP ENDO 5MM (MISCELLANEOUS) IMPLANT
ELECT REM PT RETURN 9FT ADLT (ELECTROSURGICAL) ×2
ELECTRODE REM PT RTRN 9FT ADLT (ELECTROSURGICAL) ×2 IMPLANT
GLOVE BIO SURGEON STRL SZ7.5 (GLOVE) ×2 IMPLANT
GLOVE SURG SYN 7.5  E (GLOVE) ×2
GLOVE SURG SYN 7.5 E (GLOVE) ×2 IMPLANT
GLOVE SURG SYN 7.5 PF PI (GLOVE) ×2 IMPLANT
GOWN STRL REUS W/ TWL LRG LVL3 (GOWN DISPOSABLE) ×4 IMPLANT
GOWN STRL REUS W/ TWL XL LVL3 (GOWN DISPOSABLE) ×2 IMPLANT
GOWN STRL REUS W/TWL LRG LVL3 (GOWN DISPOSABLE) ×4
GOWN STRL REUS W/TWL XL LVL3 (GOWN DISPOSABLE) ×2
KIT BASIN OR (CUSTOM PROCEDURE TRAY) ×2 IMPLANT
KIT TURNOVER KIT B (KITS) ×2 IMPLANT
MARKER SKIN DUAL TIP RULER LAB (MISCELLANEOUS) IMPLANT
MESH 3DMAX MID 5X7 LT XLRG (Mesh General) IMPLANT
MESH VENTRALIGHT ST 8X10 (Mesh General) IMPLANT
NDL INSUFFLATION 14GA 120MM (NEEDLE) ×2 IMPLANT
NEEDLE INSUFFLATION 14GA 120MM (NEEDLE) ×2 IMPLANT
NS IRRIG 1000ML POUR BTL (IV SOLUTION) ×2 IMPLANT
PAD ARMBOARD 7.5X6 YLW CONV (MISCELLANEOUS) ×4 IMPLANT
POUCH LAPAROSCOPIC INSTRUMENT (MISCELLANEOUS) ×2 IMPLANT
RELOAD STAPLE 4.0 BLU F/HERNIA (INSTRUMENTS) IMPLANT
RELOAD STAPLE 4.8 BLK F/HERNIA (STAPLE) IMPLANT
RELOAD STAPLE HERNIA 4.0 BLUE (INSTRUMENTS) ×2 IMPLANT
RELOAD STAPLE HERNIA 4.8 BLK (STAPLE) IMPLANT
SCISSORS LAP 5X35 DISP (ENDOMECHANICALS) ×2 IMPLANT
SET IRRIG TUBING LAPAROSCOPIC (IRRIGATION / IRRIGATOR) IMPLANT
SET TUBE SMOKE EVAC HIGH FLOW (TUBING) ×2 IMPLANT
SHEARS HARMONIC ACE PLUS 36CM (ENDOMECHANICALS) IMPLANT
SLEEVE ENDOPATH XCEL 5M (ENDOMECHANICALS) ×2 IMPLANT
STAPLER HERNIA 12 8.5 360D (INSTRUMENTS) IMPLANT
SUT CHROMIC 2 0 SH (SUTURE) ×2 IMPLANT
SUT ETHIBOND 0 MO6 C/R (SUTURE) IMPLANT
SUT MNCRL AB 4-0 PS2 18 (SUTURE) ×2 IMPLANT
SUT NOVA 1 T20/GS 25DT (SUTURE) IMPLANT
SUT PROLENE 2 0 KS (SUTURE) IMPLANT
SUT SILK 3 0 SH 30 (SUTURE) IMPLANT
SUT VIC AB 1 CT1 27 (SUTURE)
SUT VIC AB 1 CT1 27XBRD ANBCTR (SUTURE) IMPLANT
SUT VICRYL 0 UR6 27IN ABS (SUTURE) IMPLANT
SUT VLOC 180 2-0 6IN GS21 (SUTURE) IMPLANT
SYRINGE TOOMEY DISP (SYRINGE) ×2 IMPLANT
TOWEL GREEN STERILE (TOWEL DISPOSABLE) ×2 IMPLANT
TOWEL GREEN STERILE FF (TOWEL DISPOSABLE) ×2 IMPLANT
TRAY FOLEY W/BAG SLVR 14FR (SET/KITS/TRAYS/PACK) ×2 IMPLANT
TRAY FOLEY W/BAG SLVR 16FR (SET/KITS/TRAYS/PACK)
TRAY FOLEY W/BAG SLVR 16FR ST (SET/KITS/TRAYS/PACK) IMPLANT
TRAY LAPAROSCOPIC MC (CUSTOM PROCEDURE TRAY) ×2 IMPLANT
TROCAR OPTICAL SHORT 5MM (TROCAR) ×2 IMPLANT
TROCAR OPTICAL SLV SHORT 5MM (TROCAR) ×2 IMPLANT
TROCAR XCEL 12X100 BLDLESS (ENDOMECHANICALS) ×2 IMPLANT
TROCAR XCEL NON-BLD 11X100MML (ENDOMECHANICALS) IMPLANT
TROCAR Z-THREAD OPTICAL 5X100M (TROCAR) ×2 IMPLANT
TUBING STERILIZE HEAT 6X100 (STERILIZATION PRODUCTS) IMPLANT
WARMER LAPAROSCOPE (MISCELLANEOUS) ×2 IMPLANT
WATER STERILE IRR 1000ML POUR (IV SOLUTION) ×2 IMPLANT

## 2022-07-06 NOTE — Op Note (Signed)
07/06/2022  9:29 AM  PATIENT:  William [name]  56 y.o. male  PRE-OPERATIVE DIAGNOSIS:  VENTRAL HERNIA, RUCURRENT LEFT INGUINAL HERNIA  POST-OPERATIVE DIAGNOSIS:  VENTRAL HERNIA, RECURRENT LEFT INGUINAL HERNIA, ADHESIONS  PROCEDURE:  Procedure(s): LAPAROSCOPIC POSSIBLE OPEN LEFT INGUINAL HERNIA REPAIR (Left) LAPAROSCOPIC VENTRAL HERNIA REPAIR WITH MESH (N/A) Adhesiolysis x1 hour  SURGEON:  Surgeon(s) and Role:    [name], MD - Primary   ASSISTANTS: Toula Moos, MD, PGY-5, who was essential at helping with retraction, visualization, and repair of the hernia.  ANESTHESIA:   local and general  EBL:  minimal   BLOOD ADMINISTERED:none  DRAINS: none   LOCAL MEDICATIONS USED:  BUPIVICAINE   SPECIMEN:  No Specimen  DISPOSITION OF SPECIMEN:  N/A  COUNTS:  YES  TOURNIQUET:  * No tourniquets in log *  DICTATION: .Dragon Dictation  Indication procedure: Patient is a 56 year old male who comes in secondary to a left inguinal hernia as well as a midline incisional hernia.  Patient was counseled and decided to have this electively repaired.  Findings: Patient had a recurrent left inguinal hernia with sigmoid colon Sliding.  This was fixed in a preperitoneal fashion.  There was a large rent in the peritoneum.  This was likely due to previous surgery and peritoneum was missing.  This was oversewn to protect the bowel using a 2 oh V-Loc.  The patient had a significant mount of small bowel adhesed to the intra-abdominal wall.  Adhesiolysis was approximately 1 hour.  The incisional hernia was approximately 15 x 4 cm.  This was brought together using interrupted Ethibonds.  A piece of 25 x 20 cm mesh was used to cover the incisional hernia.  Details of procedure: Counts: reported as correct x 2   Findings:  The patient had a moderate sized left indirect hernia and a moderated sized cord lipoma     Details of the procedure: The patient was taken back to the operating room.  The patient was placed in supine position with bilateral SCDs in place.  The patient was prepped and draped in the usual sterile fashion.  After appropriate anitbiotics were confirmed, a time-out was confirmed and all facts were verified.   0.25% Marcaine was used to infiltrate the umbilical area. A 11-blade was used to cut down the skin and blunt dissection was used to get the anterior fashion.  The anterior fascia was incised approximately 1 cm and the muscles were retracted laterally. Blunt dissection was then used to create a space in the preperitoneal area. At this time a 10 mm camera was then introduced into the space and advanced the pubic tubercle and a 12 mm trocar was placed over this and insufflation was started.  At this time and space was created from medial to laterally the preperitoneal space.  Cooper's ligament was initially cleaned off.  The hernia sac was identified in the indirect space. Dissection of the hernia sac and cord structures was undertaken the vas deferens was identified and protected in all parts of the case.  There appeared to be missing part of the peritoneum with sigmoid colon.  At this time a 2 oh V-Loc suture was then used to laparoscopically close this defect.  This consisted of bringing together the medial portion of the peritoneum to preperitoneal fat.   Once the hernia sac was taken down to approximately the umbilicus a Left Bard 3D MID mesh, size: Rachelle Hora, was  introduced into the preperitoneal space.  The mesh was brought over to  cover the direct and indirect hernia spaces.  This was anchored into place and secured to Cooper's ligament with 4.31m staples from a [place] hernia stapler. It was anchored to the anterior abdominal wall with 4.8 mm staples. The hernia sac was seen lying posterior to the mesh. There was no staples placed laterally. The insufflation was evacuated and the peritoneum was seen posterior to the mesh. The trochars were removed. The anterior fascia was  reapproximated using #1 Vicryl on a UR- 6.  Intra-abdominal air was evacuated and the Veress needle removed.  We proceeded with a laparoscopic ventral hernia at this time.  The Veress needle technique was used to insuflate the abdomen at Palmer's point. The abdomen was insufflated to 14 mm mercury. Subsequently a 5 mm trocar was placed a camera inserted there was no injury to any intra-abdominal organs.  There was significant mount of adhesions to the intra-abdominal wall consisting of small bowel.  I was able to visualize the right abdominal wall.  There appeared to be minimal adhesions there.  I decided to place a 5 Miller trocar in the right upper quadrant and right lower quadrant.  These worked as working ports.  I was able to slowly take down the small bowel adhesions to the anterior domino wall as well as omentum.  This was done using sharp dissection.  Adhesiolysis was approximate 1 hour.  There was seen to be an none incarcerated 15 x 4 cm hernia.    At this the Falicform ligament was taken down with Bovie cautery maintaining hemostasis. A 522mport was placed in the epigastrium .    The fascia at the hernia was reapproximated using a # #1 Ethibonds x 6.  Once the hernia was cleared away, a Bard Ventralight 20x25cm  mesh was inserted into the abdomen.  The mesh was secured circumferentially with am Securestrap tacker in a double crown fashion.    The omentum was brought over the area of the mesh. The pneumoperitoneum was evacuated  & all trocars  were removed. The skin was reapproximated with 4-0  Monocryl sutures in a subcuticular fashion. The skin was dressed with Dermabond.  The patient was taken to the recovery room in stable condition.  I was personally present during the key and critical portions of this procedure and immediately available throughout the entire procedure, as documented in my operative note.   Type of repair -primary suture & mesh  Mesh overlap - 10cm  Placement of  mesh -  beneath fascia and into peritoneal cavity  Size: >10cm, Primary Hernia, and Reducible Hernia    PLAN OF CARE: Discharge to home after PACU  PATIENT DISPOSITION:  PACU - hemodynamically stable.   Delay start of Pharmacological VTE agent (>24hrs) due to surgical blood loss or risk of bleeding: not applicable

## 2022-07-06 NOTE — Anesthesia Procedure Notes (Signed)
Procedure Name: Intubation Date/Time: 07/06/2022 7:43 AM  Performed by: Reeves Dam, CRNAPre-anesthesia Checklist: Patient identified, Patient being monitored, Timeout performed, Emergency Drugs available and Suction available Patient Re-evaluated:Patient Re-evaluated prior to induction Oxygen Delivery Method: Circle system utilized Preoxygenation: Pre-oxygenation with 100% oxygen Induction Type: IV induction Ventilation: Mask ventilation without difficulty Laryngoscope Size: Mac and 3 Grade View: Grade I Tube type: Oral Tube size: 7.5 mm Number of attempts: 1 Airway Equipment and Method: Stylet Placement Confirmation: ETT inserted through vocal cords under direct vision, positive ETCO2 and breath sounds checked- equal and bilateral Secured at: 21 (23) cm Tube secured with: Tape Dental Injury: Teeth and Oropharynx as per pre-operative assessment  Comments: Intubation by RN, atraumatic dentition  unchanged

## 2022-07-06 NOTE — Transfer of Care (Signed)
Immediate Anesthesia Transfer of Care Note  Patient: William [name]  Procedure(s) Performed: LAPAROSCOPIC POSSIBLE OPEN LEFT INGUINAL HERNIA REPAIR (Left: Abdomen) LAPAROSCOPIC VENTRAL HERNIA REPAIR WITH MESH (Abdomen)  Patient Location: PACU  Anesthesia Type:General  Level of Consciousness: awake, alert , oriented and patient cooperative  Airway & Oxygen Therapy: Patient Spontanous Breathing  Post-op Assessment: Report given to RN and Post -op Vital signs reviewed and stable  Post vital signs: Reviewed and stable  Last Vitals:  Vitals Value Taken Time  BP 126/80 07/06/22 1000  Temp    Pulse 87 07/06/22 1001  Resp 15 07/06/22 1001  SpO2 95 % 07/06/22 1001  Vitals shown include unvalidated device data.  Last Pain:  Vitals:   07/06/22 0634  TempSrc:   PainSc: 1          Complications: No notable events documented.

## 2022-07-06 NOTE — Discharge Instructions (Addendum)
[facility] _______[facility]  HERNIA REPAIR: POST OP INSTRUCTIONS  Always review your discharge instruction sheet given to you by the facility where your surgery was performed. IF YOU HAVE DISABILITY OR FAMILY LEAVE FORMS, YOU MUST BRING THEM TO THE OFFICE FOR PROCESSING.   DO NOT GIVE THEM TO YOUR DOCTOR.  1. A  prescription for pain medication may be given to you upon discharge.  Take your pain medication as prescribed, if needed.  If narcotic pain medicine is not needed, then you may take acetaminophen (Tylenol) or ibuprofen (Advil) as needed. 2. Take your usually prescribed medications unless otherwise directed. If you need a refill on your pain medication, please contact your pharmacy.  They will contact our office to request authorization. Prescriptions will not be filled after 5 pm or on week-ends. 3. You should follow a light diet the first 24 hours after arrival home, such as soup and crackers, etc.  Be sure to include lots of fluids daily.  Resume your normal diet the day after surgery. 4.Most patients will experience some swelling and bruising around the umbilicus or in the groin and scrotum.  Ice packs and reclining will help.  Swelling and bruising can take several days to resolve.  6. It is common to experience some constipation if taking pain medication after surgery.  Increasing fluid intake and taking a stool softener (such as Colace) will usually help or prevent this problem from occurring.  A mild laxative (Milk of Magnesia or Miralax) should be taken according to package directions if there are no bowel movements after 48 hours. 7. Unless discharge instructions indicate otherwise, you may remove your bandages 24-48 hours after surgery, and you may shower at that time.  You may have steri-strips (small skin tapes) in place directly over the incision.  These strips should be left on the skin for 7-10 days.  If your surgeon used skin glue on the incision, you may shower in  24 hours.  The glue will flake off over the next 2-3 weeks.  Any sutures or staples will be removed at the office during your follow-up visit. 8. ACTIVITIES:  You may resume regular (light) daily activities beginning the next day--such as daily self-care, walking, climbing stairs--gradually increasing activities as tolerated.  You may have sexual intercourse when it is comfortable.  Refrain from any heavy lifting or straining until approved by your doctor.  a.You may drive when you are no longer taking prescription pain medication, you can comfortably wear a seatbelt, and you can safely maneuver your car and apply brakes. b.RETURN TO WORK:   _____________________________________________  9.You should see your doctor in the office for a follow-up appointment approximately 2-3 weeks after your surgery.  Make sure that you call for this appointment within a day or two after you arrive home to insure a convenient appointment time. 10.OTHER INSTRUCTIONS: _________________________    _____________________________________  WHEN TO CALL YOUR DOCTOR: Fever over 101.0 Inability to urinate Nausea and/or vomiting Extreme swelling or bruising Continued bleeding from incision. Increased pain, redness, or drainage from the incision  The clinic staff is available to answer your questions during regular business hours.  Please don't hesitate to call and ask to speak to one of the nurses for clinical concerns.  If you have a medical emergency, go to the nearest emergency room or call 911.  A surgeon from [facility] is always on call at the hospital   [address], [address], [place], [place]    [address] ?  [address] [address], [place], [place]   [address] [phone] ? [phone] ? FAX [phone] Web site: [email or web address]  

## 2022-07-06 NOTE — H&P (Signed)
Chief Complaint: Recheck Hernia   History of Present Illness: William [name] is a 56 y.o. male who is seen today as an office consultation at the request of Dr. Pasty Arch for evaluation of Recheck Hernia .   Patient follows back up today. He has had some increased pain to the left inguinal area. He did recently undergo CT scan. This does show a fat-containing hernia. He states that he has been constipated, taking MiraLAX which has been helping. He has noticed some swelling, distention to the left inguinal area.  Patient continues to have an upper midline hernia in the umbilicus.  Patient states that secondary to pain to left inguinal hernia and it getting larger and more uncomfortable we will like to have this repaired.  Review of Systems: A complete review of systems was obtained from the patient. I have reviewed this information and discussed as appropriate with the patient. See HPI as well for other ROS.  Review of Systems  Constitutional: Negative for fever.  HENT: Negative for congestion.  Eyes: Negative for blurred vision.  Respiratory: Negative for cough, shortness of breath and wheezing.  Cardiovascular: Negative for chest pain and palpitations.  Gastrointestinal: Positive for abdominal pain. Negative for heartburn.  Genitourinary: Negative for dysuria.  Musculoskeletal: Negative for myalgias.  Skin: Negative for rash.  Neurological: Negative for dizziness and headaches.  Psychiatric/Behavioral: Negative for depression and suicidal ideas.  All other systems reviewed and are negative.   Medical History: Past Medical History:  Diagnosis Date  Abdominal hernia  Allergy  Arthritis  Fracture  GERD (gastroesophageal reflux disease)  Hyperlipidemia  Hypertension  Liver disease   Patient Active Problem List  Diagnosis  Essential hypertension  Hyperlipidemia  Insomnia  Chronic pain of both shoulders  Gastroesophageal reflux disease   Past Surgical History:  Procedure  Laterality Date  EXPLORATORY LAPAROTOMY 06/01/2021  Dr Lesli Albee  APPENDECTOMY  CHOLECYSTECTOMY  COLON SURGERY  HERNIA REPAIR  KNEE ARTHROSCOPY  shoulder surgery    Allergies  Allergen Reactions  Amoxicillin-Pot Clavulanate Diarrhea  Biaxin [Clarithromycin] Swelling  Other Nausea And Vomiting  anesthesia  Sulfa (Sulfonamide Antibiotics) Swelling  Tamsulosin Hcl Headache  Headache, dizziness   Current Outpatient Medications on File Prior to Visit  Medication Sig Dispense Refill  amLODIPine (NORVASC) 10 MG tablet Take 1 tablet (10 mg total) by mouth once daily 30 tablet 11  ascorbic acid-elderberry fruit 100-50 mg Chew Take 2 tablets by mouth once daily  fluticasone propionate (FLONASE) 50 mcg/actuation nasal spray Place 2 sprays into both nostrils once daily 16 g 2  losartan (COZAAR) 50 MG tablet Take 1 tablet (50 mg total) by mouth once daily 30 tablet 11  lovastatin (MEVACOR) 40 MG tablet TAKE 1 TABLET BY MOUTH ONCE DAILY WITH SUPPER 90 tablet 3  multivitamin capsule Take 2 capsules by mouth once daily  neomycin-polymyxin-hydrocortisone (CORTISPORIN) otic suspension Place 1 drop into both ears once daily as needed  omeprazole (PRILOSEC) 40 MG DR capsule Take 1 capsule by mouth once daily 90 capsule 3  tadalafiL (CIALIS) 5 MG tablet Take 5 mg by mouth once daily  zolpidem (AMBIEN) 10 mg tablet Take 1 tablet (10 mg total) by mouth at bedtime as needed for Sleep for up to 180 days 30 tablet 5   No current facility-administered medications on file prior to visit.   Family History  Problem Relation Age of Onset  Hyperlipidemia (Elevated cholesterol) Mother  High blood pressure (Hypertension) Mother  Thyroid disease Mother  High blood pressure (Hypertension) Father  Colon cancer Father  Autoimmune disease Sister  Colon cancer Paternal Grandmother    Social History   Tobacco Use  Smoking Status Former  Smokeless Tobacco Never    Social History   Socioeconomic  History  Marital status: Married  Tobacco Use  Smoking status: Former  Smokeless tobacco: Never  Surveyor, mining Use: Never used  Substance and Sexual Activity  Alcohol use: Yes  Comment: 2- 5 glasses week  Drug use: Never  Sexual activity: Yes  Partners: Female   Objective:   Vitals:  05/16/22 1035  Pulse: 101  Temp: 36.8 C (98.2 F)  SpO2: 98%  Weight: 96.2 kg (212 lb)  Height: 182.9 cm (6')   Body mass index is 28.75 kg/m. Physical Exam Constitutional:  Appearance: Normal appearance.  HENT:  Head: Normocephalic and atraumatic.  Nose: Nose normal. No congestion.  Mouth/Throat:  Mouth: Mucous membranes are moist.  Pharynx: Oropharynx is clear.  Eyes:  Pupils: Pupils are equal, round, and reactive to light.  Cardiovascular:  Rate and Rhythm: Normal rate and regular rhythm.  Pulses: Normal pulses.  Heart sounds: Normal heart sounds. No murmur heard. No friction rub. No gallop.  Pulmonary:  Effort: Pulmonary effort is normal. No respiratory distress.  Breath sounds: Normal breath sounds. No stridor. No wheezing, rhonchi or rales.  Abdominal:  General: Abdomen is flat.  Hernia: A hernia is present. Hernia is present in the left inguinal area. There is no hernia in the right inguinal area.   Comments: 3cm  Musculoskeletal:  General: Normal range of motion.  Cervical back: Normal range of motion.  Skin: General: Skin is warm and dry.  Neurological:  General: No focal deficit present.  Mental Status: He is alert and oriented to person, place, and time.  Psychiatric:  Mood and Affect: Mood normal.  Thought Content: Thought content normal.    Assessment and Plan:  Diagnoses and all orders for this visit:  Ventral hernia without obstruction or gangrene  Recurrent left inguinal hernia   William [name] is a 56 y.o. male   We will proceed to the OR for a laparoscopic versus open left inguinal hernia repair with mesh and an incisional hernia repair with  mesh. All risks and benefits were discussed with the patient, to generally include infection, bleeding, damage to surrounding structures, acute and chronic nerve pain, and recurrence. Alternatives were offered and described. All questions were answered and the patient voiced understanding of the procedure and wishes to proceed at this point.  No follow-ups on file.  [name], MD, [facility], [facility]

## 2022-07-06 NOTE — Anesthesia Postprocedure Evaluation (Signed)
Anesthesia Post Note  Patient: William [name]  Procedure(s) Performed: LAPAROSCOPIC POSSIBLE OPEN LEFT INGUINAL HERNIA REPAIR (Left: Abdomen) LAPAROSCOPIC VENTRAL HERNIA REPAIR WITH MESH (Abdomen)     Patient location during evaluation: PACU Anesthesia Type: General Level of consciousness: awake and alert Pain management: pain level controlled Vital Signs Assessment: post-procedure vital signs reviewed and stable Respiratory status: spontaneous breathing, nonlabored ventilation and respiratory function stable Cardiovascular status: blood pressure returned to baseline and stable Postop Assessment: no apparent nausea or vomiting Anesthetic complications: no   No notable events documented.  Last Vitals:  Vitals:   07/06/22 1115 07/06/22 1130  BP: 117/87 115/71  Pulse: 91 80  Resp: 17 12  Temp:  36.8 C  SpO2: 93% 93%    Last Pain:  Vitals:   07/06/22 1115  TempSrc:   PainSc: [place]

## 2022-07-07 ENCOUNTER — Encounter (HOSPITAL_COMMUNITY): Payer: Self-pay | Admitting: General Surgery

## 2022-07-08 ENCOUNTER — Other Ambulatory Visit: Payer: Self-pay

## 2022-07-08 ENCOUNTER — Encounter (HOSPITAL_COMMUNITY): Payer: Self-pay

## 2022-07-08 ENCOUNTER — Emergency Department (HOSPITAL_COMMUNITY): Payer: BC Managed Care – PPO

## 2022-07-08 ENCOUNTER — Inpatient Hospital Stay (HOSPITAL_COMMUNITY)
Admission: EM | Admit: 2022-07-08 | Discharge: 2022-07-14 | DRG: 337 | Disposition: A | Discharge status: Home / Self Care | Payer: BC Managed Care – PPO | Attending: General Surgery | Admitting: General Surgery

## 2022-07-08 ENCOUNTER — Inpatient Hospital Stay (HOSPITAL_COMMUNITY): Payer: BC Managed Care – PPO

## 2022-07-08 DIAGNOSIS — Z8616 Personal history of COVID-19: Secondary | ICD-10-CM

## 2022-07-08 DIAGNOSIS — K56609 Unspecified intestinal obstruction, unspecified as to partial versus complete obstruction: Secondary | ICD-10-CM | POA: Diagnosis present

## 2022-07-08 DIAGNOSIS — K4091 Unilateral inguinal hernia, without obstruction or gangrene, recurrent: Secondary | ICD-10-CM | POA: Diagnosis present

## 2022-07-08 DIAGNOSIS — G4700 Insomnia, unspecified: Secondary | ICD-10-CM | POA: Diagnosis present

## 2022-07-08 DIAGNOSIS — Z79899 Other long term (current) drug therapy: Secondary | ICD-10-CM

## 2022-07-08 DIAGNOSIS — E785 Hyperlipidemia, unspecified: Secondary | ICD-10-CM | POA: Diagnosis present

## 2022-07-08 DIAGNOSIS — D176 Benign lipomatous neoplasm of spermatic cord: Secondary | ICD-10-CM | POA: Diagnosis present

## 2022-07-08 DIAGNOSIS — Z87891 Personal history of nicotine dependence: Secondary | ICD-10-CM

## 2022-07-08 DIAGNOSIS — K219 Gastro-esophageal reflux disease without esophagitis: Secondary | ICD-10-CM | POA: Diagnosis present

## 2022-07-08 DIAGNOSIS — Z880 Allergy status to penicillin: Secondary | ICD-10-CM | POA: Diagnosis not present

## 2022-07-08 DIAGNOSIS — Z8249 Family history of ischemic heart disease and other diseases of the circulatory system: Secondary | ICD-10-CM | POA: Diagnosis not present

## 2022-07-08 DIAGNOSIS — I700 Atherosclerosis of aorta: Secondary | ICD-10-CM | POA: Diagnosis present

## 2022-07-08 DIAGNOSIS — Z888 Allergy status to other drugs, medicaments and biological substances status: Secondary | ICD-10-CM

## 2022-07-08 DIAGNOSIS — G8929 Other chronic pain: Secondary | ICD-10-CM | POA: Diagnosis present

## 2022-07-08 DIAGNOSIS — Z9049 Acquired absence of other specified parts of digestive tract: Secondary | ICD-10-CM

## 2022-07-08 DIAGNOSIS — K565 Intestinal adhesions [bands], unspecified as to partial versus complete obstruction: Secondary | ICD-10-CM | POA: Diagnosis present

## 2022-07-08 DIAGNOSIS — I10 Essential (primary) hypertension: Secondary | ICD-10-CM | POA: Diagnosis present

## 2022-07-08 DIAGNOSIS — Z881 Allergy status to other antibiotic agents status: Secondary | ICD-10-CM | POA: Diagnosis not present

## 2022-07-08 LAB — COMPREHENSIVE METABOLIC PANEL
ALT: 66 U/L — ABNORMAL HIGH (ref 0–44)
AST: 67 U/L — ABNORMAL HIGH (ref 15–41)
Albumin: 4.4 g/dL (ref 3.5–5.0)
Alkaline Phosphatase: 83 U/L (ref 38–126)
Anion gap: 13 (ref 5–15)
BUN: 17 mg/dL (ref 6–20)
CO2: 20 mmol/L — ABNORMAL LOW (ref 22–32)
Calcium: 10 mg/dL (ref 8.9–10.3)
Chloride: 103 mmol/L (ref 98–111)
Creatinine, Ser: 0.77 mg/dL (ref 0.61–1.24)
GFR, Estimated: >60 mL/min (ref >60)
Glucose, Bld: 128 mg/dL — ABNORMAL HIGH (ref 70–99)
Potassium: 3.9 mmol/L (ref 3.5–5.1)
Sodium: 136 mmol/L (ref 135–145)
Total Bilirubin: 1 mg/dL (ref 0.3–1.2)
Total Protein: 7.6 g/dL (ref 6.5–8.1)

## 2022-07-08 LAB — CBC
HCT: 51 % (ref 39.0–52.0)
Hemoglobin: 17 g/dL (ref 13.0–17.0)
MCH: 30.2 pg (ref 26.0–34.0)
MCHC: 33.3 g/dL (ref 30.0–36.0)
MCV: 90.7 fL (ref 80.0–100.0)
Platelets: 316 10*3/uL (ref 150–400)
RBC: 5.62 MIL/uL (ref 4.22–5.81)
RDW: 13.4 % (ref 11.5–15.5)
WBC: 17.6 10*3/uL — ABNORMAL HIGH (ref 4.0–10.5)
nRBC: 0 % (ref 0.0–0.2)

## 2022-07-08 LAB — LIPASE, BLOOD: Lipase: 30 U/L (ref 11–51)

## 2022-07-08 MED ORDER — ONDANSETRON HCL 4 MG/2ML IJ SOLN
4.0000 mg | Freq: Four times a day (QID) | INTRAMUSCULAR | Status: DC | PRN
  Administered 2022-07-09 – 2022-07-12 (×6): 4 mg via INTRAVENOUS
  Filled 2022-07-08 (×6): qty 2

## 2022-07-08 MED ORDER — ACETAMINOPHEN 10 MG/ML IV SOLN
1000.0000 mg | Freq: Four times a day (QID) | INTRAVENOUS | Status: AC
  Administered 2022-07-08 – 2022-07-09 (×4): 1000 mg via INTRAVENOUS
  Filled 2022-07-08 (×4): qty 100

## 2022-07-08 MED ORDER — HYDROMORPHONE HCL 1 MG/ML IJ SOLN
1.0000 mg | Freq: Once | INTRAMUSCULAR | Status: AC
  Administered 2022-07-08: 1 mg via INTRAVENOUS
  Filled 2022-07-08: qty 1

## 2022-07-08 MED ORDER — LACTATED RINGERS IV BOLUS
1000.0000 mL | Freq: Once | INTRAVENOUS | Status: AC
  Administered 2022-07-08: 1000 mL via INTRAVENOUS

## 2022-07-08 MED ORDER — ONDANSETRON 4 MG PO TBDP
4.0000 mg | ORAL_TABLET | Freq: Once | ORAL | Status: AC | PRN
  Administered 2022-07-08: 4 mg via ORAL

## 2022-07-08 MED ORDER — IOHEXOL 350 MG/ML SOLN
75.0000 mL | Freq: Once | INTRAVENOUS | Status: AC | PRN
  Administered 2022-07-08: 75 mL via INTRAVENOUS

## 2022-07-08 MED ORDER — FENTANYL CITRATE PF 50 MCG/ML IJ SOSY
50.0000 ug | PREFILLED_SYRINGE | Freq: Once | INTRAMUSCULAR | Status: AC
  Administered 2022-07-08: 50 ug via INTRAVENOUS
  Filled 2022-07-08: qty 1

## 2022-07-08 MED ORDER — ONDANSETRON HCL 4 MG/2ML IJ SOLN
4.0000 mg | Freq: Once | INTRAMUSCULAR | Status: AC
  Administered 2022-07-08: 4 mg via INTRAVENOUS
  Filled 2022-07-08: qty 2

## 2022-07-08 MED ORDER — FAMOTIDINE IN NACL 20-0.9 MG/50ML-% IV SOLN
20.0000 mg | Freq: Once | INTRAVENOUS | Status: AC
  Administered 2022-07-08: 20 mg via INTRAVENOUS
  Filled 2022-07-08: qty 50

## 2022-07-08 MED ORDER — AMLODIPINE BESYLATE 10 MG PO TABS
10.0000 mg | ORAL_TABLET | Freq: Every day | ORAL | Status: DC
  Administered 2022-07-08 – 2022-07-13 (×6): 10 mg via ORAL
  Filled 2022-07-08 (×6): qty 1

## 2022-07-08 MED ORDER — FLUTICASONE PROPIONATE 50 MCG/ACT NA SUSP
2.0000 | Freq: Every day | NASAL | Status: DC | PRN
  Filled 2022-07-08: qty 16

## 2022-07-08 MED ORDER — DIPHENHYDRAMINE HCL 12.5 MG/5ML PO ELIX: 12.5000 mg | ORAL_SOLUTION | Freq: Four times a day (QID) | ORAL | Status: DC | PRN

## 2022-07-08 MED ORDER — HYDROMORPHONE HCL 1 MG/ML IJ SOLN
0.5000 mg | INTRAMUSCULAR | Status: DC | PRN
  Administered 2022-07-08 – 2022-07-14 (×36): 0.5 mg via INTRAVENOUS
  Filled 2022-07-08 (×36): qty 0.5

## 2022-07-08 MED ORDER — ONDANSETRON 4 MG PO TBDP: 4.0000 mg | ORAL_TABLET | Freq: Four times a day (QID) | ORAL | Status: DC | PRN

## 2022-07-08 MED ORDER — HYOSCYAMINE SULFATE 0.125 MG SL SUBL: 0.1250 mg | SUBLINGUAL_TABLET | Freq: Four times a day (QID) | SUBLINGUAL | Status: DC | PRN

## 2022-07-08 MED ORDER — LIDOCAINE VISCOUS HCL 2 % MT SOLN
15.0000 mL | Freq: Once | OROMUCOSAL | Status: AC
  Administered 2022-07-08: 15 mL via OROMUCOSAL
  Filled 2022-07-08: qty 15

## 2022-07-08 MED ORDER — ONDANSETRON 4 MG PO TBDP
ORAL_TABLET | ORAL | Status: AC
  Filled 2022-07-08: qty 1

## 2022-07-08 MED ORDER — HYDRALAZINE HCL 20 MG/ML IJ SOLN: 10.0000 mg | INTRAMUSCULAR | Status: DC | PRN

## 2022-07-08 MED ORDER — ENOXAPARIN SODIUM 40 MG/0.4ML IJ SOSY
40.0000 mg | PREFILLED_SYRINGE | Freq: Every day | INTRAMUSCULAR | Status: DC
  Administered 2022-07-08 – 2022-07-13 (×6): 40 mg via SUBCUTANEOUS
  Filled 2022-07-08 (×6): qty 0.4

## 2022-07-08 MED ORDER — METOPROLOL TARTRATE 5 MG/5ML IV SOLN: 5.0000 mg | Freq: Four times a day (QID) | INTRAVENOUS | Status: DC | PRN

## 2022-07-08 MED ORDER — LOSARTAN POTASSIUM 50 MG PO TABS
50.0000 mg | ORAL_TABLET | Freq: Every day | ORAL | Status: DC
  Administered 2022-07-08 – 2022-07-13 (×6): 50 mg via ORAL
  Filled 2022-07-08 (×6): qty 1

## 2022-07-08 MED ORDER — DIPHENHYDRAMINE HCL 50 MG/ML IJ SOLN: 12.5000 mg | Freq: Four times a day (QID) | INTRAMUSCULAR | Status: DC | PRN

## 2022-07-08 MED ORDER — DIATRIZOATE MEGLUMINE & SODIUM 66-10 % PO SOLN
90.0000 mL | Freq: Once | ORAL | Status: AC
  Administered 2022-07-09: 90 mL via NASOGASTRIC
  Filled 2022-07-08 (×2): qty 90

## 2022-07-08 NOTE — H&P (Signed)
CC: Nausea/vomiting  HPI: William [name] is an 56 y.o. male known to our service after having undergone surgery 3 days ago by one of my partners, Dr. Rosendo Gros.  He underwent laparoscopic/preperitoneal left inguinal hernia repair as well as a laparoscopic ventral hernia repair with mesh and adhesiolysis.  He was discharged home following the procedure.  He woke up this morning with nausea, vomiting, abdominal discomfort, distention.  He reports he has not had a bowel movement since Wednesday.  He does note a history of prior bowel obstructions.  This feels similar to those.  Past Medical History:  Diagnosis Date   Aortic atherosclerosis ([place])    COVID    10-09-2019   Difficulty sleeping    TAKES ADVIL PM   Diverticulitis of intestine with perforation and abscess    Diverticulosis    Dysuria    ED (erectile dysfunction)    Family history of adverse reaction to anesthesia    sister  and mom PONV   GERD (gastroesophageal reflux disease)    Headache    Hyperlipidemia    Hypertension    Internal hemorrhoids    Osteoarthritis    Pancreatitis, acute    PONV (postoperative nausea and vomiting)    Prostatitis    Reflux    Tubular adenoma of colon    Volvulus of intestine ([place]) 05/2021    Past Surgical History:  Procedure Laterality Date   ABCESS DRAINAGE     ABDOMINAL   APPENDECTOMY     CHOLECYSTECTOMY     COLONOSCOPY N/A 06/09/2015   Procedure: COLONOSCOPY;  Surgeon: Daneil Dolin, MD;  [facility];  Service: Endoscopy;  Laterality: N/A;  11:30 Am   HERNIA REPAIR     INGUINAL HERNIA REPAIR Left 07/06/2022   Procedure: LAPAROSCOPIC POSSIBLE OPEN LEFT INGUINAL HERNIA REPAIR;  Surgeon: [name], MD;  Location: [place];  Service: General;  Laterality: Left;   KNEE SURGERY     Both   LAPAROSCOPIC LYSIS OF ADHESIONS N/A 08/19/2020   Procedure: LAPAROSCOPIC LYSIS OF ADHESIONS;  Surgeon: Armandina Gemma, MD;  Location: [facility];  Service: General;  Laterality: N/A;    LAPAROSCOPIC SIGMOID COLECTOMY N/A 11/17/2013   Procedure: LAPAROSCOPIC SIGMOID COLECTOMY rigid proctoscopy;  Surgeon: Edward Jolly, MD;  Location: [facility];  Service: General;  Laterality: N/A;   LAPAROSCOPY N/A 08/19/2020   Procedure: LAPAROSCOPY DIAGNOSTIC AND OPEN LEFT INGIUNAL HERNIA REPAIR WITH MESH;  Surgeon: Armandina Gemma, MD;  Location[facility];  Service: General;  Laterality: N/A;   LAPAROTOMY N/A 06/01/2021   Procedure: EXPLORATORY LAPAROTOMY;  Surgeon: Herbert Pun, MD;  Location: [facility];  Service: General;  Laterality: N/A;   SHOULDER ARTHROSCOPY     x3   SHOULDER SURGERY Left May 2016   SPHINCTEROTOMY     for sphincter of Oddi dysfunction   TONSILLECTOMY     VENTRAL HERNIA REPAIR N/A 07/06/2022   Procedure: LAPAROSCOPIC VENTRAL [place];  Surgeon: [name], MD;  Location: [facility] OR;  Service: General;  Laterality: N/A;    Family History  Problem Relation Age of Onset   Cancer Father        ? Colon mass removed    Ulcerative colitis Sister    Colon cancer Maternal Grandmother        34s   Esophageal cancer Neg Hx    Pancreatic cancer Neg Hx    Stomach cancer Neg Hx     Social:  reports that he quit smoking about  35 years ago. His smoking use included cigarettes. He has never used smokeless tobacco. He reports current alcohol use of about 3.0 standard drinks of alcohol per week. He reports that he does not use drugs.  Allergies:  Allergies  Allergen Reactions   Augmentin [Amoxicillin-Pot Clavulanate] Diarrhea   Acyclovir And Related Swelling    Mouth swelling and sores   Biaxin [Clarithromycin]     Headache and vomiting,sores in mouth   Flomax [Tamsulosin Hcl]     Headache, dizziness   Other Nausea And Vomiting    anesthesia     Medications: I have reviewed the patient's current medications.  Results for orders placed or performed during the hospital encounter of 07/08/22 (from the past 48 hour(s))  Lipase, blood     Status: None    Collection Time: 07/08/22  3:29 PM  Result Value Ref Range   Lipase 30 11 - 51 U/L    Comment: Performed at [facility], [place] [address], [place], [place] [address]  Comprehensive metabolic panel     Status: Abnormal   Collection Time: 07/08/22  3:29 PM  Result Value Ref Range   Sodium 136 135 - 145 mmol/L   Potassium 3.9 3.5 - 5.1 mmol/L   Chloride 103 98 - 111 mmol/L   CO2 20 (L) 22 - 32 mmol/L   Glucose, Bld 128 (H) 70 - 99 mg/dL    Comment: Glucose reference range applies only to samples taken after fasting for at least 8 hours.   BUN 17 6 - 20 mg/dL   Creatinine, Ser 0.77 0.61 - 1.24 mg/dL   Calcium 10.0 8.9 - 10.3 mg/dL   Total Protein 7.6 6.5 - 8.1 g/dL   Albumin 4.4 3.5 - 5.0 g/dL   AST 67 (H) 15 - 41 U/L   ALT 66 (H) 0 - 44 U/L   Alkaline Phosphatase 83 38 - 126 U/L   Total Bilirubin 1.0 0.3 - 1.2 mg/dL   GFR, Estimated >60 >60 mL/min    Comment: (NOTE) Calculated using the CKD-EPI Creatinine Equation (2021)    Anion gap 13 5 - 15    Comment: Performed at [facility] [address] [place], [place] [address]  CBC     Status: Abnormal   Collection Time: 07/08/22  3:29 PM  Result Value Ref Range   WBC 17.6 (H) 4.0 - 10.5 K/uL   RBC 5.62 4.22 - 5.81 MIL/uL   Hemoglobin 17.0 13.0 - 17.0 g/dL   HCT 51.0 39.0 - 52.0 %   MCV 90.7 80.0 - 100.0 fL   MCH 30.2 26.0 - 34.0 pg   MCHC 33.3 30.0 - 36.0 g/dL   RDW 13.4 11.5 - 15.5 %   Platelets 316 150 - 400 K/uL   nRBC 0.0 0.0 - 0.2 %    Comment: Performed at [facility], [place] [address], [place], [place] [address]    CT ABDOMEN PELVIS W CONTRAST  Result Date: 07/08/2022 CLINICAL DATA:  Acute abdominal pain. Surgery on Thursday for hernia repair adhesions. EXAM: CT ABDOMEN AND PELVIS WITH CONTRAST TECHNIQUE: Multidetector CT imaging of the abdomen and pelvis was performed using the standard protocol following bolus administration of intravenous contrast. RADIATION DOSE REDUCTION: This exam was performed  according to the departmental dose-optimization program which includes automated exposure control, adjustment of the mA and/or kV according to patient size and/or use of iterative reconstruction technique. CONTRAST:  3m OMNIPAQUE IOHEXOL 350 MG/ML SOLN COMPARISON:  CT  abdomen and pelvis 05/10/2022 FINDINGS: Lower chest: There is atelectasis in both lung bases. Hepatobiliary: No focal liver abnormality is seen. Status post cholecystectomy. No biliary dilatation. Pancreas: Unremarkable. No pancreatic ductal dilatation or surrounding inflammatory changes. Spleen: Normal in size without focal abnormality. Adrenals/Urinary Tract: Adrenal glands are unremarkable. Kidneys are normal, without renal calculi, focal lesion, or hydronephrosis. Bladder is unremarkable. Stomach/Bowel: There are dilated mid and proximal small bowel loops with air-fluid levels measuring up to 4.5 cm. The stomach is dilated with large air-fluid level. There is a small amount of fluid in the distal esophagus, likely gastroesophageal reflux. Transition point is seen in the lower mid abdomen image 3/76. Distal small bowel and colon are completely decompressed. Sigmoid colon anastomosis is present. Appendix is not visualized. There is no focal bowel wall thickening or inflammation. No pneumatosis. Vascular/Lymphatic: No significant vascular findings are present. No enlarged abdominal or pelvic lymph nodes. Reproductive: Prostate is unremarkable. Other: There is a small amount of free fluid throughout the abdomen and pelvis and a small amount of free air likely related to recent surgery. Abdominal wall air is also present likely related to recent surgery. There has been interval repair of umbilical hernia. There is a small fluid collection in the abdominal wall at this level measuring 2.1 x 3.3 x 3.7 cm. Patient is status post left inguinal hernia repair as well with a small amount of strandy fluid. Musculoskeletal: No acute or significant osseous  findings. IMPRESSION: 1. Small-bowel obstruction with transition point in the lower abdomen. Stomach and small bowel are dilated with air-fluid levels. 2. Small amount of free air and free fluid throughout the abdomen and pelvis, presumably from recent surgery. 3. Patient is status post recent umbilical hernia repair. There is a fluid collection in the abdominal wall at this level measuring 2.1 x 3.3 x 3.7 cm which may represent postoperative seroma, resolving hematoma, or abscess in the appropriate clinical setting. 4. Patient is status post left inguinal hernia repair. 5. Fluid in the distal esophagus compatible with gastroesophageal reflux. Electronically Signed   By: Ronney Asters M.D.   On: 07/08/2022 18:47    ROS - all of the below systems have been reviewed with the patient and positives are indicated with bold text General: chills, fever or night sweats Eyes: blurry vision or double vision ENT: epistaxis or sore throat Allergy/Immunology: itchy/watery eyes or nasal congestion Hematologic/Lymphatic: bleeding problems, blood clots or swollen lymph nodes Endocrine: temperature intolerance or unexpected weight changes Breast: new or changing breast lumps or nipple discharge Resp: cough, shortness of breath, or wheezing CV: chest pain or dyspnea on exertion GI: as per HPI GU: dysuria, trouble voiding, or hematuria MSK: joint pain or joint stiffness Neuro: TIA or stroke symptoms Derm: pruritus and skin lesion changes Psych: anxiety and depression  PE Blood pressure (!) 126/97, pulse (!) 105, temperature 98.8 F (37.1 C), temperature source Oral, resp. rate (!) 24, height '6\' 1"'$  (1.854 m), weight 93 kg, SpO2 92 %. Constitutional: NAD; conversant Eyes: Moist conjunctiva Lungs: Normal respiratory effort CV: RRR;  no pitting edema GI: Abd soft, moderately distended; incisions healing appropriately without erythema nor drainage. No palpable hernias either. Psychiatric: Appropriate affect;  alert and oriented x3   Results for orders placed or performed during the hospital encounter of 07/08/22 (from the past 48 hour(s))  Lipase, blood     Status: None   Collection Time: 07/08/22  3:29 PM  Result Value Ref Range   Lipase 30 11 - 51 U/L  Comment: Performed at [facility], [place] [address] [place], [place] [address]  Comprehensive metabolic panel     Status: Abnormal   Collection Time: 07/08/22  3:29 PM  Result Value Ref Range   Sodium 136 135 - 145 mmol/L   Potassium 3.9 3.5 - 5.1 mmol/L   Chloride 103 98 - 111 mmol/L   CO2 20 (L) 22 - 32 mmol/L   Glucose, Bld 128 (H) 70 - 99 mg/dL    Comment: Glucose reference range applies only to samples taken after fasting for at least 8 hours.   BUN 17 6 - 20 mg/dL   Creatinine, Ser 0.77 0.61 - 1.24 mg/dL   Calcium 10.0 8.9 - 10.3 mg/dL   Total Protein 7.6 6.5 - 8.1 g/dL   Albumin 4.4 3.5 - 5.0 g/dL   AST 67 (H) 15 - 41 U/L   ALT 66 (H) 0 - 44 U/L   Alkaline Phosphatase 83 38 - 126 U/L   Total Bilirubin 1.0 0.3 - 1.2 mg/dL   GFR, Estimated >60 >60 mL/min    Comment: (NOTE) Calculated using the CKD-EPI Creatinine Equation (2021)    Anion gap 13 5 - 15    Comment: Performed at [facility] [address] [place], [place] [address]  CBC     Status: Abnormal   Collection Time: 07/08/22  3:29 PM  Result Value Ref Range   WBC 17.6 (H) 4.0 - 10.5 K/uL   RBC 5.62 4.22 - 5.81 MIL/uL   Hemoglobin 17.0 13.0 - 17.0 g/dL   HCT 51.0 39.0 - 52.0 %   MCV 90.7 80.0 - 100.0 fL   MCH 30.2 26.0 - 34.0 pg   MCHC 33.3 30.0 - 36.0 g/dL   RDW 13.4 11.5 - 15.5 %   Platelets 316 150 - 400 K/uL   nRBC 0.0 0.0 - 0.2 %    Comment: Performed at [facility], [place] [address] [place], [place] [address]    CT ABDOMEN PELVIS W CONTRAST  Result Date: 07/08/2022 CLINICAL DATA:  Acute abdominal pain. Surgery on Thursday for hernia repair adhesions. EXAM: CT ABDOMEN AND PELVIS WITH CONTRAST TECHNIQUE: Multidetector CT imaging of  the abdomen and pelvis was performed using the standard protocol following bolus administration of intravenous contrast. RADIATION DOSE REDUCTION: This exam was performed according to the departmental dose-optimization program which includes automated exposure control, adjustment of the mA and/or kV according to patient size and/or use of iterative reconstruction technique. CONTRAST:  74m OMNIPAQUE IOHEXOL 350 MG/ML SOLN COMPARISON:  CT abdomen and pelvis 05/10/2022 FINDINGS: Lower chest: There is atelectasis in both lung bases. Hepatobiliary: No focal liver abnormality is seen. Status post cholecystectomy. No biliary dilatation. Pancreas: Unremarkable. No pancreatic ductal dilatation or surrounding inflammatory changes. Spleen: Normal in size without focal abnormality. Adrenals/Urinary Tract: Adrenal glands are unremarkable. Kidneys are normal, without renal calculi, focal lesion, or hydronephrosis. Bladder is unremarkable. Stomach/Bowel: There are dilated mid and proximal small bowel loops with air-fluid levels measuring up to 4.5 cm. The stomach is dilated with large air-fluid level. There is a small amount of fluid in the distal esophagus, likely gastroesophageal reflux. Transition point is seen in the lower mid abdomen image 3/76. Distal small bowel and colon are completely decompressed. Sigmoid colon anastomosis is present. Appendix is not visualized. There is no focal bowel wall thickening or inflammation. No pneumatosis. Vascular/Lymphatic: No significant vascular findings are present. No enlarged abdominal or pelvic lymph nodes. Reproductive: Prostate is unremarkable.  Other: There is a small amount of free fluid throughout the abdomen and pelvis and a small amount of free air likely related to recent surgery. Abdominal wall air is also present likely related to recent surgery. There has been interval repair of umbilical hernia. There is a small fluid collection in the abdominal wall at this level measuring  2.1 x 3.3 x 3.7 cm. Patient is status post left inguinal hernia repair as well with a small amount of strandy fluid. Musculoskeletal: No acute or significant osseous findings. IMPRESSION: 1. Small-bowel obstruction with transition point in the lower abdomen. Stomach and small bowel are dilated with air-fluid levels. 2. Small amount of free air and free fluid throughout the abdomen and pelvis, presumably from recent surgery. 3. Patient is status post recent umbilical hernia repair. There is a fluid collection in the abdominal wall at this level measuring 2.1 x 3.3 x 3.7 cm which may represent postoperative seroma, resolving hematoma, or abscess in the appropriate clinical setting. 4. Patient is status post left inguinal hernia repair. 5. Fluid in the distal esophagus compatible with gastroesophageal reflux. Electronically Signed   By: Ronney Asters M.D.   On: 07/08/2022 18:47     A/P: Jeancarlos Marchena is an 56 y.o. male with small bowel obstruction; recent laparoscopic [facility] 07/06/22 - Dr. Rosendo Gros  -Admit to ward -NPO, NG tube to low intermittent wall suction, SBO protocol -MIVF -Ppx: SCDs, Lovenox -All of the above reviewed with him. Questions were answered. He has expressed understanding and agreement with the plan  Nadeen Landau, MD [facility], [place]

## 2022-07-08 NOTE — ED Triage Notes (Signed)
Pt reports having surgery Thursday for hernia repair and some adhesions. Pt states he woke up this morning with severe abdominal pain, distention, and nausea. Lbm was Wednesday. Hx of bowel obstructions. Pt appears to be in severe pain and is diaphoretic.

## 2022-07-08 NOTE — ED Provider Notes (Signed)
[place] EMERGENCY DEPARTMENT Provider Note   CSN: [id number] Arrival date & time: 07/08/22  1512     History {Add pertinent medical, surgical, social history, OB history to HPI:1} Chief Complaint  Patient presents with   Post-op Problem   Abdominal Pain    William [name] is a 56 y.o. male.  HPI     56 year old male with history of hypertension, hyperlipidemia, prior volvulus, with recent ventral hernia and recurrent left inguinal hernia repair with Dr. Rosendo Gros on September 21, who presents with concern for abdominal pain, nausea and vomiting.  Today pain worsened Last few days would hurt with getting up, moving Today not manageable, bloating, distention Vomited twice here, at home at zofran and took it, had nausea No BM since the surgery.  Passing flatus in last few days not passing gas today  No urinary symptoms, No fever, dyspnea other than pressure from bloating Does not quite feel like prior bowel obstruction  Past Medical History:  Diagnosis Date   Aortic atherosclerosis ([place])    COVID    10-09-2019   Difficulty sleeping    TAKES ADVIL PM   Diverticulitis of intestine with perforation and abscess    Diverticulosis    Dysuria    ED (erectile dysfunction)    Family history of adverse reaction to anesthesia    sister  and mom PONV   GERD (gastroesophageal reflux disease)    Headache    Hyperlipidemia    Hypertension    Internal hemorrhoids    Osteoarthritis    Pancreatitis, acute    PONV (postoperative nausea and vomiting)    Prostatitis    Reflux    Tubular adenoma of colon    Volvulus of intestine ([place]) 05/2021    Home Medications Prior to Admission medications   Medication Sig Start Date End Date Taking? Authorizing Provider  amLODipine (NORVASC) 10 MG tablet Take 1 tablet (10 mg total) by mouth at bedtime. 08/10/20   Lovena Le, Malena M, DO  fluticasone (FLONASE) 50 MCG/ACT nasal spray Use 2 spray(s) in each nostril once  daily Patient taking differently: Place 2 sprays into both nostrils daily as needed for allergies. 05/19/21   Lovena Le, Malena M, DO  hyoscyamine (LEVSIN SL) 0.125 MG SL tablet Place 1 tablet (0.125 mg total) under the tongue every 6 (six) hours as needed (take 1-2 every 6 hours as needed). 04/25/22   Pyrtle, Lajuan Lines, MD  losartan (COZAAR) 25 MG tablet Take 50 mg by mouth at bedtime. 03/10/21   [provider]  lovastatin (MEVACOR) 40 MG tablet Take 1 tablet (40 mg total) by mouth at bedtime. 07/29/20   Erven Colla, DO  Multiple Vitamins-Minerals (ALIVE MENS ENERGY PO) Take 1 tablet by mouth daily.    [provider]  neomycin-polymyxin-hydrocortisone (CORTISPORIN) 3.5-10000-1 OTIC suspension Place 3 drops in the affected ear four times daily Patient taking differently: Place 3 drops into both ears daily as needed (When goes in to the pool). 04/14/20   Lovena Le, Malena M, DO  omeprazole (PRILOSEC) 40 MG capsule Take 1 capsule (40 mg total) by mouth daily. 07/12/20   Erven Colla, DO  oxyCODONE-acetaminophen (PERCOCET) 5-325 MG tablet Take 1 tablet by mouth every 4 (four) hours as needed for severe pain. 07/06/22 07/06/23  [name], MD  tadalafil (CIALIS) 5 MG tablet Take 1 tablet (5 mg total) by mouth daily. Patient taking differently: Take 2.5 mg by mouth daily. 08/29/21   McKenzie, Candee Furbish, MD  zinc gluconate  50 MG tablet Take 50 mg by mouth daily.    [provider]  zolpidem (AMBIEN CR) 12.5 MG CR tablet Take 12.5 mg by mouth at bedtime as needed for sleep. Patient not taking: Reported on 06/23/2022 05/04/21   [provider]  zolpidem (AMBIEN) 10 MG tablet Take 10 mg by mouth at bedtime. 06/07/22   [provider]      Allergies    Augmentin [amoxicillin-pot clavulanate], Acyclovir and related, Biaxin [clarithromycin], Flomax [tamsulosin hcl], and Other    Review of Systems   Review of Systems  Physical Exam Updated Vital Signs BP 117/88    Pulse 78   Temp 98.7 F (37.1 C)   Resp 18   Ht '6\' 1"'$  (1.854 m)   Wt 93 kg   SpO2 96%   BMI 27.05 kg/m  Physical Exam  ED Results / Procedures / Treatments   Labs (all labs ordered are listed, but only abnormal results are displayed) Labs Reviewed  COMPREHENSIVE METABOLIC PANEL - Abnormal; Notable for the following components:      Result Value   CO2 20 (*)    Glucose, Bld 128 (*)    AST 67 (*)    ALT 66 (*)    All other components within normal limits  CBC - Abnormal; Notable for the following components:   WBC 17.6 (*)    All other components within normal limits  LIPASE, BLOOD  URINALYSIS, ROUTINE W REFLEX MICROSCOPIC    EKG None  Radiology No results found.  Procedures Procedures  {Document cardiac monitor, telemetry assessment procedure when appropriate:1}  Medications Ordered in ED Medications  ondansetron (ZOFRAN-ODT) 4 MG disintegrating tablet (has no administration in time range)  ondansetron (ZOFRAN-ODT) disintegrating tablet 4 mg (4 mg Oral Given 07/08/22 1539)  lactated ringers bolus 1,000 mL (1,000 mLs Intravenous New Bag/Given 07/08/22 1622)  fentaNYL (SUBLIMAZE) injection 50 mcg (50 mcg Intravenous Given 07/08/22 1622)    ED Course/ Medical Decision Making/ A&P                           Medical Decision Making Amount and/or Complexity of Data Reviewed Labs: ordered. Radiology: ordered. ECG/medicine tests: ordered.  Risk Prescription drug management.   56 year old male with history of hypertension, hyperlipidemia, prior volvulus, with recent ventral hernia and recurrent left inguinal hernia repair with Dr. Rosendo Gros on September 21, who presents with concern for abdominal pain, nausea and vomiting.  {Document critical care time when appropriate:1} {Document review of labs and clinical decision tools ie heart score, Chads2Vasc2 etc:1}  {Document your independent review of radiology images, and any outside records:1} {Document your discussion  with family members, caretakers, and with consultants:1} {Document social determinants of health affecting pt's care:1} {Document your decision making why or why not admission, treatments were needed:1} Final Clinical Impression(s) / ED Diagnoses Final diagnoses:  None    Rx / DC Orders ED Discharge Orders     None

## 2022-07-09 ENCOUNTER — Inpatient Hospital Stay (HOSPITAL_COMMUNITY): Payer: BC Managed Care – PPO

## 2022-07-09 LAB — CBC
HCT: 46.6 % (ref 39.0–52.0)
Hemoglobin: 15.7 g/dL (ref 13.0–17.0)
MCH: 30.3 pg (ref 26.0–34.0)
MCHC: 33.7 g/dL (ref 30.0–36.0)
MCV: 89.8 fL (ref 80.0–100.0)
Platelets: 250 10*3/uL (ref 150–400)
RBC: 5.19 MIL/uL (ref 4.22–5.81)
RDW: 13.3 % (ref 11.5–15.5)
WBC: 14.2 10*3/uL — ABNORMAL HIGH (ref 4.0–10.5)
nRBC: 0 % (ref 0.0–0.2)

## 2022-07-09 LAB — BASIC METABOLIC PANEL
Anion gap: 11 (ref 5–15)
BUN: 18 mg/dL (ref 6–20)
CO2: 25 mmol/L (ref 22–32)
Calcium: 9.3 mg/dL (ref 8.9–10.3)
Chloride: 100 mmol/L (ref 98–111)
Creatinine, Ser: 1 mg/dL (ref 0.61–1.24)
GFR, Estimated: >60 mL/min (ref >60)
Glucose, Bld: 116 mg/dL — ABNORMAL HIGH (ref 70–99)
Potassium: 3.9 mmol/L (ref 3.5–5.1)
Sodium: 136 mmol/L (ref 135–145)

## 2022-07-09 LAB — HIV ANTIBODY (ROUTINE TESTING W REFLEX): HIV Screen 4th Generation wRfx: NONREACTIVE

## 2022-07-09 MED ORDER — LACTATED RINGERS IV SOLN: INTRAVENOUS | Status: DC

## 2022-07-09 MED ORDER — FAMOTIDINE IN NACL 20-0.9 MG/50ML-% IV SOLN
20.0000 mg | Freq: Once | INTRAVENOUS | Status: AC
  Administered 2022-07-09: 20 mg via INTRAVENOUS
  Filled 2022-07-09: qty 50

## 2022-07-09 MED ORDER — SODIUM CHLORIDE 0.9 % IV SOLN
12.5000 mg | Freq: Three times a day (TID) | INTRAVENOUS | Status: DC | PRN
  Administered 2022-07-10 – 2022-07-13 (×3): 12.5 mg via INTRAVENOUS
  Filled 2022-07-09 (×3): qty 0.5

## 2022-07-09 MED ORDER — PHENOL 1.4 % MT LIQD
1.0000 | OROMUCOSAL | Status: DC | PRN
  Administered 2022-07-09: 1 via OROMUCOSAL
  Filled 2022-07-09: qty 177

## 2022-07-09 NOTE — Progress Notes (Signed)
Gastrografin administered 07/09/22 at 11:55 via NG tube

## 2022-07-09 NOTE — Progress Notes (Signed)
Subjective: CC: Reports pain mostly on b/l mid abdomen prior to presentation. Pain better today. Nausea wax/wanes and better today. NGT output 700cc since placement. Just started passing some flatus this am, first since Friday. No bm. Last bm wed. Mobilizing in the halls.   Objective: Vital signs in last 24 hours: Temp:  [97.6 F (36.4 C)-98.8 F (37.1 C)] 98.3 F (36.8 C) (09/24 0803) Pulse Rate:  [77-111] 77 (09/24 0803) Resp:  [16-24] 18 (09/24 0803) BP: (117-132)/(83-100) 131/89 (09/24 0803) SpO2:  [91 %-98 %] 97 % (09/24 0803) Weight:  [93 kg] 93 kg (09/23 1532) Last BM Date : 07/05/22  Intake/Output from previous day: 09/23 0701 - 09/24 0700 In: 1050.1 [IV Piggyback:1050.1] Out: 700 [Emesis/NG output:700] Intake/Output this shift: No intake/output data recorded.  PE: Gen:  Alert, NAD, pleasant Abd Mild to mod distension noted but very soft; some ttp in upper abdomen without rigidity or guarding; incisions healing appropriately without erythema nor drainage. No palpable hernias either.  Lab Results:  Recent Labs    07/08/22 1529 07/09/22 0103  WBC 17.6* 14.2*  HGB 17.0 15.7  HCT 51.0 46.6  PLT 316 250   BMET Recent Labs    07/08/22 1529 07/09/22 0103  NA 136 136  K 3.9 3.9  CL 103 100  CO2 20* 25  GLUCOSE 128* 116*  BUN 17 18  CREATININE 0.77 1.00  CALCIUM 10.0 9.3   PT/INR No results for input(s): "LABPROT", "INR" in the last 72 hours. CMP     Component Value Date/Time   NA 136 07/09/2022 0103   NA 140 11/28/2019 0910   K 3.9 07/09/2022 0103   CL 100 07/09/2022 0103   CO2 25 07/09/2022 0103   GLUCOSE 116 (H) 07/09/2022 0103   BUN 18 07/09/2022 0103   BUN 23 11/28/2019 0910   CREATININE 1.00 07/09/2022 0103   CREATININE 0.94 04/06/2014 0747   CALCIUM 9.3 07/09/2022 0103   PROT 7.6 07/08/2022 1529   PROT 7.2 11/28/2019 0910   ALBUMIN 4.4 07/08/2022 1529   ALBUMIN 4.9 11/28/2019 0910   AST 67 (H) 07/08/2022 1529   ALT 66 (H)  07/08/2022 1529   ALKPHOS 83 07/08/2022 1529   BILITOT 1.0 07/08/2022 1529   BILITOT 0.5 11/28/2019 0910   GFRNONAA >60 07/09/2022 0103   GFRAA >60 01/15/2020 1100   Lipase     Component Value Date/Time   LIPASE 30 07/08/2022 1529    Studies/Results: DG Abd Portable 1V-Small Bowel Protocol-Position Verification  Result Date: 07/08/2022 CLINICAL DATA:  NG tube placement EXAM: PORTABLE ABDOMEN - 1 VIEW COMPARISON:  CT earlier today FINDINGS: NG tube is in the stomach. Dilated stomach and small bowel compatible with small-bowel obstruction as seen on prior CT. Prior cholecystectomy. No free air. IMPRESSION: NG tube in the stomach. Electronically Signed   By: Rolm Baptise M.D.   On: 07/08/2022 21:17   CT ABDOMEN PELVIS W CONTRAST  Result Date: 07/08/2022 CLINICAL DATA:  Acute abdominal pain. Surgery on Thursday for hernia repair adhesions. EXAM: CT ABDOMEN AND PELVIS WITH CONTRAST TECHNIQUE: Multidetector CT imaging of the abdomen and pelvis was performed using the standard protocol following bolus administration of intravenous contrast. RADIATION DOSE REDUCTION: This exam was performed according to the departmental dose-optimization program which includes automated exposure control, adjustment of the mA and/or kV according to patient size and/or use of iterative reconstruction technique. CONTRAST:  51m OMNIPAQUE IOHEXOL 350 MG/ML SOLN COMPARISON:  CT abdomen and pelvis 05/10/2022  FINDINGS: Lower chest: There is atelectasis in both lung bases. Hepatobiliary: No focal liver abnormality is seen. Status post cholecystectomy. No biliary dilatation. Pancreas: Unremarkable. No pancreatic ductal dilatation or surrounding inflammatory changes. Spleen: Normal in size without focal abnormality. Adrenals/Urinary Tract: Adrenal glands are unremarkable. Kidneys are normal, without renal calculi, focal lesion, or hydronephrosis. Bladder is unremarkable. Stomach/Bowel: There are dilated mid and proximal small  bowel loops with air-fluid levels measuring up to 4.5 cm. The stomach is dilated with large air-fluid level. There is a small amount of fluid in the distal esophagus, likely gastroesophageal reflux. Transition point is seen in the lower mid abdomen image 3/76. Distal small bowel and colon are completely decompressed. Sigmoid colon anastomosis is present. Appendix is not visualized. There is no focal bowel wall thickening or inflammation. No pneumatosis. Vascular/Lymphatic: No significant vascular findings are present. No enlarged abdominal or pelvic lymph nodes. Reproductive: Prostate is unremarkable. Other: There is a small amount of free fluid throughout the abdomen and pelvis and a small amount of free air likely related to recent surgery. Abdominal wall air is also present likely related to recent surgery. There has been interval repair of umbilical hernia. There is a small fluid collection in the abdominal wall at this level measuring 2.1 x 3.3 x 3.7 cm. Patient is status post left inguinal hernia repair as well with a small amount of strandy fluid. Musculoskeletal: No acute or significant osseous findings. IMPRESSION: 1. Small-bowel obstruction with transition point in the lower abdomen. Stomach and small bowel are dilated with air-fluid levels. 2. Small amount of free air and free fluid throughout the abdomen and pelvis, presumably from recent surgery. 3. Patient is status post recent umbilical hernia repair. There is a fluid collection in the abdominal wall at this level measuring 2.1 x 3.3 x 3.7 cm which may represent postoperative seroma, resolving hematoma, or abscess in the appropriate clinical setting. 4. Patient is status post left inguinal hernia repair. 5. Fluid in the distal esophagus compatible with gastroesophageal reflux. Electronically Signed   By: Ronney Asters M.D.   On: 07/08/2022 18:47    Anti-infectives: Anti-infectives (From admission, onward)    None         Assessment/Plan William [name] is an 56 y.o. male with small bowel obstruction; recent laparoscopic LIHR + VHR 07/06/22 - Dr. Rosendo Gros - CT w/ SBO w/ transition in lower mid abdomen. No obvious recurrence of hernias. CT comments on fluid collections in the abdominal wall - incisions cdi on exam, will continue monitor  - Cont NPO, NG tube to low intermittent wall suction and SBO protocol (does not appear gastrografin has been given yet) - Can clamp ngt for mobilization - We will continue to follow with you  FEN - NPO, NGT, IVF VTE - SCDs, Lovenox ID - None   LOS: 1 day    Jillyn Ledger , [facility] 07/09/2022, 8:10 AM Please see Amion for pager number during day hours 7:00am-4:30pm

## 2022-07-10 ENCOUNTER — Inpatient Hospital Stay (HOSPITAL_COMMUNITY): Payer: BC Managed Care – PPO

## 2022-07-10 LAB — CBC
HCT: 44 % (ref 39.0–52.0)
Hemoglobin: 15.1 g/dL (ref 13.0–17.0)
MCH: 30.6 pg (ref 26.0–34.0)
MCHC: 34.3 g/dL (ref 30.0–36.0)
MCV: 89.2 fL (ref 80.0–100.0)
Platelets: 263 10*3/uL (ref 150–400)
RBC: 4.93 MIL/uL (ref 4.22–5.81)
RDW: 13.4 % (ref 11.5–15.5)
WBC: 10.1 10*3/uL (ref 4.0–10.5)
nRBC: 0 % (ref 0.0–0.2)

## 2022-07-10 LAB — BASIC METABOLIC PANEL
Anion gap: 14 (ref 5–15)
BUN: 27 mg/dL — ABNORMAL HIGH (ref 6–20)
CO2: 28 mmol/L (ref 22–32)
Calcium: 9.7 mg/dL (ref 8.9–10.3)
Chloride: 101 mmol/L (ref 98–111)
Creatinine, Ser: 1.06 mg/dL (ref 0.61–1.24)
GFR, Estimated: >60 mL/min (ref >60)
Glucose, Bld: 111 mg/dL — ABNORMAL HIGH (ref 70–99)
Potassium: 4 mmol/L (ref 3.5–5.1)
Sodium: 143 mmol/L (ref 135–145)

## 2022-07-10 NOTE — Progress Notes (Signed)
Mobility Specialist - Progress Note   07/10/22 1131  Mobility  Activity Ambulated independently in hallway  Level of Assistance Independent  Assistive Device None  Distance Ambulated (ft) 1100 ft  Activity Response Tolerated well  $Mobility charge 1 Mobility    Pt ambulated independently in hallway. No physical assistance required. Left in room w/ call bell in reach and all needs met.  Paulla Dolly Mobility Specialist

## 2022-07-10 NOTE — Progress Notes (Signed)
Subjective: CC: Was having pain and nausea yesterday. Required several doses of IV pain and nausea medication.  Feeling much better today. Still some left lower abdominal pain but central abdominal pain resolved. Nausea improved but still required some zofran this am. NGT output downtrending 1700cc from 0701-1900 yesterday and 900cc from 1901 -0700 overnight. 150cc bilious output in cannister currently. Passing flatus. 1 large bm this am. Contrast in colon this am.   Objective: Vital signs in last 24 hours: Temp:  [97.5 F (36.4 C)-97.9 F (36.6 C)] 97.6 F (36.4 C) (09/25 0803) Pulse Rate:  [79-109] 109 (09/25 0803) Resp:  [17-18] 17 (09/25 0803) BP: (125-134)/(80-97) 125/96 (09/25 0803) SpO2:  [95 %-98 %] 95 % (09/25 0803) Last BM Date : 07/10/22  Intake/Output from previous day: 09/24 0701 - 09/25 0700 In: 1300 [I.V.:1000; IV Piggyback:300] Out: 2600 [Emesis/NG output:2600] Intake/Output this shift: No intake/output data recorded.  PE: Gen:  Alert, NAD, pleasant Card:  Reg Pulm: Rate and effort normal Abd: Much less distended today. Very soft; some ttp along L abdomen without rigidity or guarding; incisions healing appropriately without erythema nor drainage. No palpable hernias either.  Lab Results:  Recent Labs    07/08/22 1529 07/09/22 0103  WBC 17.6* 14.2*  HGB 17.0 15.7  HCT 51.0 46.6  PLT 316 250   BMET Recent Labs    07/08/22 1529 07/09/22 0103  NA 136 136  K 3.9 3.9  CL 103 100  CO2 20* 25  GLUCOSE 128* 116*  BUN 17 18  CREATININE 0.77 1.00  CALCIUM 10.0 9.3   PT/INR No results for input(s): "LABPROT", "INR" in the last 72 hours. CMP     Component Value Date/Time   NA 136 07/09/2022 0103   NA 140 11/28/2019 0910   K 3.9 07/09/2022 0103   CL 100 07/09/2022 0103   CO2 25 07/09/2022 0103   GLUCOSE 116 (H) 07/09/2022 0103   BUN 18 07/09/2022 0103   BUN 23 11/28/2019 0910   CREATININE 1.00 07/09/2022 0103   CREATININE 0.94 04/06/2014  0747   CALCIUM 9.3 07/09/2022 0103   PROT 7.6 07/08/2022 1529   PROT 7.2 11/28/2019 0910   ALBUMIN 4.4 07/08/2022 1529   ALBUMIN 4.9 11/28/2019 0910   AST 67 (H) 07/08/2022 1529   ALT 66 (H) 07/08/2022 1529   ALKPHOS 83 07/08/2022 1529   BILITOT 1.0 07/08/2022 1529   BILITOT 0.5 11/28/2019 0910   GFRNONAA >60 07/09/2022 0103   GFRAA >60 01/15/2020 1100   Lipase     Component Value Date/Time   LIPASE 30 07/08/2022 1529    Studies/Results: DG Abd Portable 1V  Result Date: 07/10/2022 CLINICAL DATA:  56 year old male with recent surgery EXAM: PORTABLE ABDOMEN - 1 VIEW COMPARISON:  07/09/2022, 07/08/2022, CT 07/08/2022 FINDINGS: Single frontal view of the abdomen with portions of the upper abdomen excluded. Gastric tube terminates in the left upper quadrant. No significant gas within the stomach. Borderline dilated small bowel loops throughout the abdomen. Enteric contrast has reached the colon, with contrast reaching the rectum. No colonic distension. IMPRESSION: Single frontal view of the abdomen demonstrates enteric contrast reaching the rectum, with no colonic distention. Borderline dilated small bowel loops are thus reflecting either intermittent obstruction or resolving ileus. Gastric tube terminates in the stomach Electronically Signed   By: Corrie Mckusick D.O.   On: 07/10/2022 09:07   DG Abd Portable 1V-Small Bowel Obstruction Protocol-initial, 8 hr delay  Result Date: 07/09/2022 CLINICAL DATA:  small bowel obstruction Chief complaints: Post-op problem, abdominal pain EXAM: PORTABLE ABDOMEN - 1 VIEW COMPARISON:  CT abdomen pelvis 07/08/2022 FINDINGS: Enteric tube with tip and side port overlying the expected region of the gastric lumen. Persistent gaseous dilatation of several loops of small bowel. No radio-opaque calculi or other significant radiographic abnormality are seen. Right upper quadrant surgical clips. Anastomotic bowel sutures overlying the pelvis. Skin staples overlie the  pelvis. Subcutaneus soft tissue edema noted along the left lower lateral abdomen. IMPRESSION: 1. Persistent small-bowel obstruction. 2. Enteric tube in good position. Electronically Signed   By: Iven Finn M.D.   On: 07/09/2022 21:03   DG Abd Portable 1V-Small Bowel Protocol-Position Verification  Result Date: 07/08/2022 CLINICAL DATA:  NG tube placement EXAM: PORTABLE ABDOMEN - 1 VIEW COMPARISON:  CT earlier today FINDINGS: NG tube is in the stomach. Dilated stomach and small bowel compatible with small-bowel obstruction as seen on prior CT. Prior cholecystectomy. No free air. IMPRESSION: NG tube in the stomach. Electronically Signed   By: Rolm Baptise M.D.   On: 07/08/2022 21:17   CT ABDOMEN PELVIS W CONTRAST  Result Date: 07/08/2022 CLINICAL DATA:  Acute abdominal pain. Surgery on Thursday for hernia repair adhesions. EXAM: CT ABDOMEN AND PELVIS WITH CONTRAST TECHNIQUE: Multidetector CT imaging of the abdomen and pelvis was performed using the standard protocol following bolus administration of intravenous contrast. RADIATION DOSE REDUCTION: This exam was performed according to the departmental dose-optimization program which includes automated exposure control, adjustment of the mA and/or kV according to patient size and/or use of iterative reconstruction technique. CONTRAST:  44m OMNIPAQUE IOHEXOL 350 MG/ML SOLN COMPARISON:  CT abdomen and pelvis 05/10/2022 FINDINGS: Lower chest: There is atelectasis in both lung bases. Hepatobiliary: No focal liver abnormality is seen. Status post cholecystectomy. No biliary dilatation. Pancreas: Unremarkable. No pancreatic ductal dilatation or surrounding inflammatory changes. Spleen: Normal in size without focal abnormality. Adrenals/Urinary Tract: Adrenal glands are unremarkable. Kidneys are normal, without renal calculi, focal lesion, or hydronephrosis. Bladder is unremarkable. Stomach/Bowel: There are dilated mid and proximal small bowel loops with air-fluid  levels measuring up to 4.5 cm. The stomach is dilated with large air-fluid level. There is a small amount of fluid in the distal esophagus, likely gastroesophageal reflux. Transition point is seen in the lower mid abdomen image 3/76. Distal small bowel and colon are completely decompressed. Sigmoid colon anastomosis is present. Appendix is not visualized. There is no focal bowel wall thickening or inflammation. No pneumatosis. Vascular/Lymphatic: No significant vascular findings are present. No enlarged abdominal or pelvic lymph nodes. Reproductive: Prostate is unremarkable. Other: There is a small amount of free fluid throughout the abdomen and pelvis and a small amount of free air likely related to recent surgery. Abdominal wall air is also present likely related to recent surgery. There has been interval repair of umbilical hernia. There is a small fluid collection in the abdominal wall at this level measuring 2.1 x 3.3 x 3.7 cm. Patient is status post left inguinal hernia repair as well with a small amount of strandy fluid. Musculoskeletal: No acute or significant osseous findings. IMPRESSION: 1. Small-bowel obstruction with transition point in the lower abdomen. Stomach and small bowel are dilated with air-fluid levels. 2. Small amount of free air and free fluid throughout the abdomen and pelvis, presumably from recent surgery. 3. Patient is status post recent umbilical hernia repair. There is a fluid collection in the abdominal wall at this level measuring 2.1 x 3.3 x 3.7 cm which may  represent postoperative seroma, resolving hematoma, or abscess in the appropriate clinical setting. 4. Patient is status post left inguinal hernia repair. 5. Fluid in the distal esophagus compatible with gastroesophageal reflux. Electronically Signed   By: Ronney Asters M.D.   On: 07/08/2022 18:47    Anti-infectives: Anti-infectives (From admission, onward)    None        Assessment/Plan William [name] is an 56 y.o.  male with small bowel obstruction; recent laparoscopic LIHR + VHR 07/06/22 - Dr. Rosendo Gros - CT w/ SBO w/ transition in lower mid abdomen. No obvious recurrence of hernias. CT comments on fluid collections in the abdominal wall - incisions cdi on exam, will continue monitor  - Appears to be opening up. Contrast in colon on xray, less pain and nausea, NGT output downtrending, having bowel function. Will start with clamping trial given he still has some nausea/pain, and small bowel still dilated some on xray.    FEN - Clamp NGT, IVF VTE - SCDs, Lovenox ID - None   LOS: 2 days    Jillyn Ledger , [facility] Surgery 07/10/2022, 9:52 AM Please see Amion for pager number during day hours 7:00am-4:30pm

## 2022-07-10 NOTE — Progress Notes (Signed)
Mobility Specialist - Progress Note   07/10/22 1454  Mobility  Activity Ambulated independently in hallway  Level of Assistance Independent  Assistive Device None  Distance Ambulated (ft) 550 ft  Activity Response Tolerated well  $Mobility charge 1 Mobility    Pt ambulating independently in hallway. No physical assistance required. Left in room w/ call bell and all needs met.   Paulla Dolly Mobility Specialist

## 2022-07-10 NOTE — Progress Notes (Signed)
Pt c/o bloating with visible abdominal distension, NGT reconnected

## 2022-07-10 NOTE — TOC CM/SW Note (Signed)
  Transition of Care [facility]) Screening Note   Patient Details  Name: William [name] Date of Birth: [birth year]      Transition of Care Department [facility]) has reviewed patient and no TOC needs have been identified at this time. We will continue to monitor patient advancement through interdisciplinary progression rounds. If new patient transition needs arise, please place a TOC consult.

## 2022-07-11 MED ORDER — FAMOTIDINE IN NACL 20-0.9 MG/50ML-% IV SOLN
20.0000 mg | Freq: Once | INTRAVENOUS | Status: AC
  Administered 2022-07-11: 20 mg via INTRAVENOUS
  Filled 2022-07-11: qty 50

## 2022-07-11 MED ORDER — FAMOTIDINE IN NACL 20-0.9 MG/50ML-% IV SOLN: 20.0000 mg | INTRAVENOUS | Status: DC

## 2022-07-11 MED ORDER — PANTOPRAZOLE SODIUM 40 MG PO TBEC
40.0000 mg | DELAYED_RELEASE_TABLET | Freq: Every day | ORAL | Status: DC
  Administered 2022-07-11 – 2022-07-14 (×4): 40 mg via ORAL
  Filled 2022-07-11 (×4): qty 1

## 2022-07-11 MED ORDER — FAMOTIDINE IN NACL 20-0.9 MG/50ML-% IV SOLN
20.0000 mg | Freq: Two times a day (BID) | INTRAVENOUS | Status: DC
  Administered 2022-07-11 – 2022-07-14 (×7): 20 mg via INTRAVENOUS
  Filled 2022-07-11 (×8): qty 50

## 2022-07-11 NOTE — Progress Notes (Signed)
   Subjective/Chief Complaint: Pt with no changes  Passing flatus Less distention   Objective: Vital signs in last 24 hours: Temp:  [97.4 F (36.3 C)-98.2 F (36.8 C)] 98.2 F (36.8 C) (09/26 0539) Pulse Rate:  [71-109] 71 (09/26 0539) Resp:  [16-17] 16 (09/26 0539) BP: (125-152)/(90-96) 152/90 (09/26 0539) SpO2:  [95 %-99 %] 99 % (09/26 0539) Last BM Date : 07/10/22  Intake/Output from previous day: 09/25 0701 - 09/26 0700 In: -  Out: 1650 [Emesis/NG output:1650] Intake/Output this shift: Total I/O In: -  Out: 850 [Emesis/NG output:850]  General appearance: alert and cooperative GI: soft, non-tender; bowel sounds normal; no masses,  no organomegaly and inc c/d/i  Lab Results:  Recent Labs    07/09/22 0103 07/10/22 0848  WBC 14.2* 10.1  HGB 15.7 15.1  HCT 46.6 44.0  PLT 250 263   BMET Recent Labs    07/09/22 0103 07/10/22 0848  NA 136 143  K 3.9 4.0  CL 100 101  CO2 25 28  GLUCOSE 116* 111*  BUN 18 27*  CREATININE 1.00 1.06  CALCIUM 9.3 9.7   PT/INR No results for input(s): "LABPROT", "INR" in the last 72 hours. ABG No results for input(s): "PHART", "HCO3" in the last 72 hours.  Invalid input(s): "PCO2", "PO2"  Studies/Results: DG Abd Portable 1V  Result Date: 07/10/2022 CLINICAL DATA:  56 year old male with recent surgery EXAM: PORTABLE ABDOMEN - 1 VIEW COMPARISON:  07/09/2022, 07/08/2022, CT 07/08/2022 FINDINGS: Single frontal view of the abdomen with portions of the upper abdomen excluded. Gastric tube terminates in the left upper quadrant. No significant gas within the stomach. Borderline dilated small bowel loops throughout the abdomen. Enteric contrast has reached the colon, with contrast reaching the rectum. No colonic distension. IMPRESSION: Single frontal view of the abdomen demonstrates enteric contrast reaching the rectum, with no colonic distention. Borderline dilated small bowel loops are thus reflecting either intermittent obstruction  or resolving ileus. Gastric tube terminates in the stomach Electronically Signed   By: Corrie Mckusick D.O.   On: 07/10/2022 09:07   DG Abd Portable 1V-Small Bowel Obstruction Protocol-initial, 8 hr delay  Result Date: 07/09/2022 CLINICAL DATA:  small bowel obstruction Chief complaints: Post-op problem, abdominal pain EXAM: PORTABLE ABDOMEN - 1 VIEW COMPARISON:  CT abdomen pelvis 07/08/2022 FINDINGS: Enteric tube with tip and side port overlying the expected region of the gastric lumen. Persistent gaseous dilatation of several loops of small bowel. No radio-opaque calculi or other significant radiographic abnormality are seen. Right upper quadrant surgical clips. Anastomotic bowel sutures overlying the pelvis. Skin staples overlie the pelvis. Subcutaneus soft tissue edema noted along the left lower lateral abdomen. IMPRESSION: 1. Persistent small-bowel obstruction. 2. Enteric tube in good position. Electronically Signed   By: Iven Finn M.D.   On: 07/09/2022 21:03       Assessment/Plan: William [name] is an 56 y.o. male with small bowel obstruction; recent laparoscopic LIHR + VHR 07/06/22 - William [name] - ileus appears to be resolving. -clamp NGT and trial clears -If no n/v x6hr sOk to DC NGT   FEN - Clamp NGT, IVF VTE - SCDs, Lovenox    LOS: 3 days    William [name] 07/11/2022

## 2022-07-11 NOTE — Progress Notes (Signed)
Mobility Specialist - Progress Note   07/11/22 1243  Mobility  Activity Ambulated independently in hallway  Level of Assistance Independent  Assistive Device None  Distance Ambulated (ft) 1100 ft  Activity Response Tolerated well  $Mobility charge 1 Mobility    Pt ambulated independently in hallway. No physical assistance needed.   Paulla Dolly Mobility Specialist

## 2022-07-11 NOTE — Plan of Care (Signed)

## 2022-07-12 ENCOUNTER — Inpatient Hospital Stay (HOSPITAL_COMMUNITY): Payer: BC Managed Care – PPO

## 2022-07-12 LAB — BASIC METABOLIC PANEL
Anion gap: 13 (ref 5–15)
BUN: 21 mg/dL — ABNORMAL HIGH (ref 6–20)
CO2: 30 mmol/L (ref 22–32)
Calcium: 9.6 mg/dL (ref 8.9–10.3)
Chloride: 102 mmol/L (ref 98–111)
Creatinine, Ser: 0.81 mg/dL (ref 0.61–1.24)
GFR, Estimated: >60 mL/min (ref >60)
Glucose, Bld: 93 mg/dL (ref 70–99)
Potassium: 3.5 mmol/L (ref 3.5–5.1)
Sodium: 145 mmol/L (ref 135–145)

## 2022-07-12 LAB — CBC
HCT: 42.4 % (ref 39.0–52.0)
Hemoglobin: 14.6 g/dL (ref 13.0–17.0)
MCH: 30.8 pg (ref 26.0–34.0)
MCHC: 34.4 g/dL (ref 30.0–36.0)
MCV: 89.5 fL (ref 80.0–100.0)
Platelets: 247 10*3/uL (ref 150–400)
RBC: 4.74 MIL/uL (ref 4.22–5.81)
RDW: 13.3 % (ref 11.5–15.5)
WBC: 8.9 10*3/uL (ref 4.0–10.5)
nRBC: 0 % (ref 0.0–0.2)

## 2022-07-12 LAB — MAGNESIUM: Magnesium: 2.1 mg/dL (ref 1.7–2.4)

## 2022-07-12 NOTE — Progress Notes (Signed)
   Subjective/Chief Complaint: Pt with BMs overnight Some distention after clamping Place on sx in the evening KUB reviewed   Objective: Vital signs in last 24 hours: Temp:  [97.8 F (36.6 C)-98.2 F (36.8 C)] 98 F (36.7 C) (09/27 0548) Pulse Rate:  [66-80] 66 (09/27 0548) Resp:  [16-17] 17 (09/27 0548) BP: (124-134)/(83-92) 124/88 (09/27 0548) SpO2:  [94 %-96 %] 96 % (09/27 0548) Last BM Date : 07/11/22  Intake/Output from previous day: 09/26 0701 - 09/27 0700 In: 1000 [I.V.:800; IV Piggyback:200] Out: 200 [Emesis/NG output:200] Intake/Output this shift: Total I/O In: -  Out: 800 [Emesis/NG output:800]  General appearance: alert and cooperative GI: soft, non-tender; bowel sounds normal; no masses,  no organomegaly  Lab Results:  Recent Labs    07/10/22 0848 07/12/22 0416  WBC 10.1 8.9  HGB 15.1 14.6  HCT 44.0 42.4  PLT 263 247   BMET Recent Labs    07/10/22 0848 07/12/22 0416  NA 143 145  K 4.0 3.5  CL 101 102  CO2 28 30  GLUCOSE 111* 93  BUN 27* 21*  CREATININE 1.06 0.81  CALCIUM 9.7 9.6   PT/INR No results for input(s): "LABPROT", "INR" in the last 72 hours. ABG No results for input(s): "PHART", "HCO3" in the last 72 hours.  Invalid input(s): "PCO2", "PO2"  Studies/Results: DG Abd Portable 1V  Result Date: 07/12/2022 CLINICAL DATA:  56 year old male with history of ileus. EXAM: PORTABLE ABDOMEN - 1 VIEW COMPARISON:  Abdominal radiograph 07/10/2022. FINDINGS: Nasogastric tube with tip in the proximal stomach. Surgical clips project over the right upper quadrant of the abdomen, likely from prior cholecystectomy. Numerous dilated loops of small bowel are again noted throughout the abdomen measuring up to 4.4 cm centrally. Gas and stool are noted throughout the colon and rectum. Oral contrast material is also noted in the colon and distal rectum, similar to the prior study. IMPRESSION: 1. The appearance of the abdomen is once again suggestive of  ileus. 2. Support apparatus and postoperative changes, as above. Electronically Signed   By: Vinnie Langton M.D.   On: 07/12/2022 07:14   DG Abd Portable 1V  Result Date: 07/10/2022 CLINICAL DATA:  56 year old male with recent surgery EXAM: PORTABLE ABDOMEN - 1 VIEW COMPARISON:  07/09/2022, 07/08/2022, CT 07/08/2022 FINDINGS: Single frontal view of the abdomen with portions of the upper abdomen excluded. Gastric tube terminates in the left upper quadrant. No significant gas within the stomach. Borderline dilated small bowel loops throughout the abdomen. Enteric contrast has reached the colon, with contrast reaching the rectum. No colonic distension. IMPRESSION: Single frontal view of the abdomen demonstrates enteric contrast reaching the rectum, with no colonic distention. Borderline dilated small bowel loops are thus reflecting either intermittent obstruction or resolving ileus. Gastric tube terminates in the stomach Electronically Signed   By: Corrie Mckusick D.O.   On: 07/10/2022 09:07    Anti-infectives: Anti-infectives (From admission, onward)    None       Assessment/Plan: William [name] is an 56 y.o. male with small bowel obstruction; recent laparoscopic LIHR + VHR 07/06/22 - Dr. Rosendo Gros - ileus appears to be resolving. -clamp NGT and clears again today -If no n/v x6hr sOk to DC NGT -I d/w pt if things con't to wax and wane may consider dx lap tomorrow.   FEN - Clamp NGT, IVF VTE - SCDs, Lovenox  LOS: 4 days    [name] 07/12/2022

## 2022-07-12 NOTE — Progress Notes (Signed)
Mobility Specialist - Progress Note   07/12/22 0943  Mobility  Activity Ambulated independently in hallway  Level of Assistance Independent  Assistive Device None  Distance Ambulated (ft) 1200 ft  Activity Response Tolerated well  $Mobility charge 1 Mobility    Pt ambulated independently in hallway. No assistance required.   Paulla Dolly Mobility Specialist

## 2022-07-12 NOTE — Progress Notes (Signed)
Mobility Specialist - Progress Note   07/12/22 1614  Mobility  Activity Ambulated independently in hallway  Level of Assistance Independent  Assistive Device None  Distance Ambulated (ft) 550 ft  Activity Response Tolerated well  $Mobility charge 1 Mobility    Pt ambulated independently in hallway. No physical assistance needed.   Paulla Dolly Mobility Specialist

## 2022-07-13 MED ORDER — ACETAMINOPHEN 325 MG PO TABS
650.0000 mg | ORAL_TABLET | Freq: Four times a day (QID) | ORAL | Status: DC | PRN
  Administered 2022-07-13 – 2022-07-14 (×3): 650 mg via ORAL
  Filled 2022-07-13 (×3): qty 2

## 2022-07-13 NOTE — Progress Notes (Signed)
   Subjective/Chief Complaint: PT doing well this aM Hav[place] mult BMs yesterday and this AM   Objective: Vital signs in last 24 hours: Temp:  [98.2 F (36.8 C)-98.4 F (36.9 C)] 98.4 F (36.9 C) (09/28 0450) Pulse Rate:  [82] 82 (09/28 0450) Resp:  [15-16] 15 (09/28 0450) BP: (118-124)/(87) 118/87 (09/28 0450) SpO2:  [96 %-97 %] 97 % (09/28 0450) Last BM Date : 07/11/22  Intake/Output from previous day: 09/27 0701 - 09/28 0700 In: 1143.3 [I.V.:1093.3; IV Piggyback:50] Out: 800 [Emesis/NG output:800] Intake/Output this shift: No intake/output data recorded.  General appearance: alert and cooperative GI: soft, non-tender; bowel sounds normal; no masses,  no organomegaly  Lab Results:  Recent Labs    07/10/22 0848 07/12/22 0416  WBC 10.1 8.9  HGB 15.1 14.6  HCT 44.0 42.4  PLT 263 247   BMET Recent Labs    07/10/22 0848 07/12/22 0416  NA 143 145  K 4.0 3.5  CL 101 102  CO2 28 30  GLUCOSE 111* 93  BUN 27* 21*  CREATININE 1.06 0.81  CALCIUM 9.7 9.6   PT/INR No results for input(s): "LABPROT", "INR" in the last 72 hours. ABG No results for input(s): "PHART", "HCO3" in the last 72 hours.  Invalid input(s): "PCO2", "PO2"  Studies/Results: DG Abd Portable 1V  Result Date: 07/12/2022 CLINICAL DATA:  56 year old male with history of ileus. EXAM: PORTABLE ABDOMEN - 1 VIEW COMPARISON:  Abdominal radiograph 07/10/2022. FINDINGS: Nasogastric tube with tip in the proximal stomach. Surgical clips project over the right upper quadrant of the abdomen, likely from prior cholecystectomy. Numerous dilated loops of small bowel are again noted throughout the abdomen measuring up to 4.4 cm centrally. Gas and stool are noted throughout the colon and rectum. Oral contrast material is also noted in the colon and distal rectum, similar to the prior study. IMPRESSION: 1. The appearance of the abdomen is once again suggestive of ileus. 2. Support apparatus and postoperative changes, as  above. Electronically Signed   By: Vinnie Langton M.D.   On: 07/12/2022 07:14    Anti-infectives: Anti-infectives (From admission, onward)    None       Assessment/Plan: William [name] is an 56 y.o. male with small bowel obstruction; recent laparoscopic LIHR + VHR 07/06/22 - Dr. Rosendo Gros  Advance diet to softs.   Hopefully if cont' to tol PO today we can plan on DC tomorrow  LOS: 5 days    [name] 07/13/2022

## 2022-07-14 NOTE — Progress Notes (Signed)
Discharge instructions given; wife present at bedside. Pt transported off unit via [place] with all belongings on self. He remains alert and stable at baseline.

## 2022-07-14 NOTE — Discharge Summary (Signed)
Physician Discharge Summary  Patient ID: William [name] MRN: [id number] DOB/AGE: 1966-03-23 56 y.o.  Admit date: 07/08/2022 Discharge date: 07/14/2022  Admission Diagnoses: Ileus  Discharge Diagnoses:  Principal Problem:   SBO (small bowel obstruction) [facility])   Discharged Condition: good  Hospital Course: Patient was admitted to the ER secondary to SBO versus ileus. Patient quickly passed the small bowel obstruction protocol.  Patient denies any nausea vomiting.  Patient NG tube placed from the ER.  This was slowly progressed to resolving ileus.  The tube was removed.  Patient was started on liquid diet and advance to a soft diet which he tolerated well.  He had numerous bowel movements.  He is otherwise generally well on his own.  Patient otherwise recovered from his ileus without any surgery.    Consults: None  Significant Diagnostic Studies: CT with SBO versus ileus  Treatments: Rehydration and bowel rest  Discharge Exam: Blood pressure 108/85, pulse 77, temperature 98.6 F (37 C), temperature source Oral, resp. rate 16, height '6\' 1"'$  (1.854 m), weight 93 kg, SpO2 97 %. General appearance: alert and cooperative GI: soft, non-tender; bowel sounds normal; no masses,  no organomegaly  Disposition: Discharge disposition: 01-Home or Self Care       Discharge Instructions     Diet - low sodium heart healthy   Complete by: As directed    Increase activity slowly   Complete by: As directed       Allergies as of 07/14/2022       Reactions   Augmentin [amoxicillin-pot Clavulanate] Diarrhea   Acyclovir And Related Swelling   Mouth swelling and sores   Biaxin [clarithromycin]    Headache and vomiting,sores in mouth   Flomax [tamsulosin Hcl]    Headache, dizziness   Other Nausea And Vomiting   anesthesia        Medication List     TAKE these medications    ALIVE MENS ENERGY PO Take 1 tablet by mouth daily.   amLODipine 10 MG tablet Commonly known as:  NORVASC Take 1 tablet (10 mg total) by mouth at bedtime.   fluticasone 50 MCG/ACT nasal spray Commonly known as: FLONASE Use 2 spray(s) in each nostril once daily What changed: See the new instructions.   hyoscyamine 0.125 MG SL tablet Commonly known as: LEVSIN SL Place 1 tablet (0.125 mg total) under the tongue every 6 (six) hours as needed (take 1-2 every 6 hours as needed). What changed: reasons to take this   losartan 50 MG tablet Commonly known as: COZAAR Take 50 mg by mouth daily.   lovastatin 40 MG tablet Commonly known as: MEVACOR Take 1 tablet (40 mg total) by mouth at bedtime.   neomycin-polymyxin-hydrocortisone 3.5-10000-1 OTIC suspension Commonly known as: CORTISPORIN Place 3 drops in the affected ear four times daily What changed:  how much to take how to take this when to take this reasons to take this additional instructions   omeprazole 40 MG capsule Commonly known as: PRILOSEC Take 1 capsule (40 mg total) by mouth daily.   oxyCODONE-acetaminophen 5-325 MG tablet Commonly known as: Percocet Take 1 tablet by mouth every 4 (four) hours as needed for severe pain.   tadalafil 5 MG tablet Commonly known as: CIALIS Take 1 tablet (5 mg total) by mouth daily. What changed: how much to take   zinc gluconate 50 MG tablet Take 50 mg by mouth daily.   zolpidem 10 MG tablet Commonly known as: AMBIEN Take 10 mg by mouth at bedtime.  Follow-up Information     [name], MD Follow up.   Specialty: General Surgery Why: Post op visit 10/18 @ 10:10AM Contact information: [place] [address] [phone]                 Signed: [name] 07/14/2022, 1:41 PM

## 2022-07-28 ENCOUNTER — Other Ambulatory Visit: Payer: Self-pay | Admitting: Student

## 2022-07-28 ENCOUNTER — Ambulatory Visit
Admission: RE | Admit: 2022-07-28 | Discharge: 2022-07-28 | Disposition: A | Discharge status: Home / Self Care | Payer: BC Managed Care – PPO | Source: Ambulatory Visit | Attending: Student | Admitting: Student

## 2022-07-28 DIAGNOSIS — G8918 Other acute postprocedural pain: Secondary | ICD-10-CM

## 2022-08-30 ENCOUNTER — Ambulatory Visit: Payer: BC Managed Care – PPO | Admitting: Urology

## 2022-09-19 ENCOUNTER — Encounter: Payer: Self-pay | Admitting: Urology

## 2022-09-19 ENCOUNTER — Ambulatory Visit (INDEPENDENT_AMBULATORY_CARE_PROVIDER_SITE_OTHER): Payer: BC Managed Care – PPO | Admitting: Urology

## 2022-09-19 VITALS — BP 139/91 | HR 76

## 2022-09-19 DIAGNOSIS — N400 Enlarged prostate without lower urinary tract symptoms: Secondary | ICD-10-CM

## 2022-09-19 DIAGNOSIS — N401 Enlarged prostate with lower urinary tract symptoms: Secondary | ICD-10-CM

## 2022-09-19 DIAGNOSIS — N5201 Erectile dysfunction due to arterial insufficiency: Secondary | ICD-10-CM

## 2022-09-19 DIAGNOSIS — R351 Nocturia: Secondary | ICD-10-CM | POA: Diagnosis not present

## 2022-09-19 LAB — URINALYSIS, ROUTINE W REFLEX MICROSCOPIC
Bilirubin, UA: NEGATIVE
Glucose, UA: NEGATIVE
Ketones, UA: NEGATIVE
Leukocytes,UA: NEGATIVE
Nitrite, UA: NEGATIVE
Protein,UA: NEGATIVE
RBC, UA: NEGATIVE
Specific Gravity, UA: 1.01 (ref 1.005–1.030)
Urobilinogen, Ur: 0.2 mg/dL (ref 0.2–1.0)
pH, UA: 6.5 (ref 5.0–7.5)

## 2022-09-19 MED ORDER — MIRABEGRON ER 25 MG PO TB24: 25.0000 mg | ORAL_TABLET | Freq: Every day | ORAL | 0 refills | Status: DC

## 2022-09-19 MED ORDER — TADALAFIL 5 MG PO TABS: 5.0000 mg | ORAL_TABLET | Freq: Every day | ORAL | 11 refills | Status: DC

## 2022-09-19 NOTE — Progress Notes (Signed)
09/19/2022 1:45 PM   William [name] [birth year] [id number]  Referring provider: Maryland Pink, MD [address] [place],  [place] [address]  Followup BPH and erectile dysfunction   HPI: William [name] is a 56yo here for followup for BPh and erectile dysfunction. He remains on tadalafil '5mg'$ . IPSS 9 QOL 2 on tadalafil 2.'5mg'$  daily. Nocturia 2x. He has urinary frequency and urgency which is bothersome to him. He uses tadalafil 2.'5mg'$  daily which improves his erectile dysfunction.   PMH: Past Medical History:  Diagnosis Date   Aortic atherosclerosis ([place])    COVID    10-09-2019   Difficulty sleeping    TAKES ADVIL PM   Diverticulitis of intestine with perforation and abscess    Diverticulosis    Dysuria    ED (erectile dysfunction)    Family history of adverse reaction to anesthesia    sister  and mom PONV   GERD (gastroesophageal reflux disease)    Headache    Hyperlipidemia    Hypertension    Internal hemorrhoids    Osteoarthritis    Pancreatitis, acute    PONV (postoperative nausea and vomiting)    Prostatitis    Reflux    Tubular adenoma of colon    Volvulus of intestine ([place]) 05/2021    Surgical History: Past Surgical History:  Procedure Laterality Date   ABCESS DRAINAGE     ABDOMINAL   APPENDECTOMY     CHOLECYSTECTOMY     COLONOSCOPY N/A 06/09/2015   Procedure: COLONOSCOPY;  Surgeon: Daneil Dolin, MD;  [facility];  Service: Endoscopy;  Laterality: N/A;  11:30 Am   HERNIA REPAIR     INGUINAL HERNIA REPAIR Left 07/06/2022   Procedure: LAPAROSCOPIC POSSIBLE OPEN LEFT INGUINAL HERNIA REPAIR;  Surgeon: [name], MD;  Location: [facility];  Service: General;  Laterality: Left;   KNEE SURGERY     Both   LAPAROSCOPIC LYSIS OF ADHESIONS N/A 08/19/2020   Procedure: LAPAROSCOPIC LYSIS OF ADHESIONS;  Surgeon: Armandina Gemma, MD;  [facility];  Service: General;  Laterality: N/A;   LAPAROSCOPIC SIGMOID COLECTOMY N/A 11/17/2013   Procedure:  LAPAROSCOPIC SIGMOID COLECTOMY rigid proctoscopy;  Surgeon: Edward Jolly, MD;  [facility];  Service: General;  Laterality: N/A;   LAPAROSCOPY N/A 08/19/2020   Procedure: LAPAROSCOPY DIAGNOSTIC AND OPEN LEFT INGIUNAL HERNIA REPAIR WITH MESH;  Surgeon: Armandina Gemma, MD;  [facility];  Service: General;  Laterality: N/A;   LAPAROTOMY N/A 06/01/2021   Procedure: EXPLORATORY LAPAROTOMY;  Surgeon: Herbert Pun, MD;  Location: [facility];  Service: General;  Laterality: N/A;   SHOULDER ARTHROSCOPY     x3   SHOULDER SURGERY Left May 2016   SPHINCTEROTOMY     for sphincter of Oddi dysfunction   TONSILLECTOMY     VENTRAL HERNIA REPAIR N/A 07/06/2022   Procedure: LAPAROSCOPIC VENTRAL HERNIA REPAIR WITH MESH;  Surgeon: [name], MD;  Location: [facility];  Service: General;  Laterality: N/A;    Home Medications:  Allergies as of 09/19/2022       Reactions   Augmentin [amoxicillin-pot Clavulanate] Diarrhea   Acyclovir And Related Swelling   Mouth swelling and sores   Biaxin [clarithromycin]    Headache and vomiting,sores in mouth   Flomax [tamsulosin Hcl]    Headache, dizziness   Other Nausea And Vomiting   anesthesia        Medication List        Accurate as of September 19, 2022  1:45 PM. If you have any questions, ask your nurse or doctor.          ALIVE MENS ENERGY PO Take 1 tablet by mouth daily.   amLODipine 10 MG tablet Commonly known as: NORVASC Take 1 tablet (10 mg total) by mouth at bedtime.   fluticasone 50 MCG/ACT nasal spray Commonly known as: FLONASE Use 2 spray(s) in each nostril once daily What changed: See the new instructions.   hyoscyamine 0.125 MG SL tablet Commonly known as: LEVSIN SL Place 1 tablet (0.125 mg total) under the tongue every 6 (six) hours as needed (take 1-2 every 6 hours as needed). What changed: reasons to take this   losartan 50 MG tablet Commonly known as: COZAAR Take 50 mg by mouth daily.   lovastatin 40  MG tablet Commonly known as: MEVACOR Take 1 tablet (40 mg total) by mouth at bedtime.   neomycin-polymyxin-hydrocortisone 3.5-10000-1 OTIC suspension Commonly known as: CORTISPORIN Place 3 drops in the affected ear four times daily What changed:  how much to take how to take this when to take this reasons to take this additional instructions   omeprazole 40 MG capsule Commonly known as: PRILOSEC Take 1 capsule (40 mg total) by mouth daily.   oxyCODONE-acetaminophen 5-325 MG tablet Commonly known as: Percocet Take 1 tablet by mouth every 4 (four) hours as needed for severe pain.   tadalafil 5 MG tablet Commonly known as: CIALIS Take 1 tablet (5 mg total) by mouth daily. What changed: how much to take   zinc gluconate 50 MG tablet Take 50 mg by mouth daily.   zolpidem 10 MG tablet Commonly known as: AMBIEN Take 10 mg by mouth at bedtime.        Allergies:  Allergies  Allergen Reactions   Augmentin [Amoxicillin-Pot Clavulanate] Diarrhea   Acyclovir And Related Swelling    Mouth swelling and sores   Biaxin [Clarithromycin]     Headache and vomiting,sores in mouth   Flomax [Tamsulosin Hcl]     Headache, dizziness   Other Nausea And Vomiting    anesthesia     Family History: Family History  Problem Relation Age of Onset   Cancer Father        ? Colon mass removed    Ulcerative colitis Sister    Colon cancer Maternal Grandmother        62s   Esophageal cancer Neg Hx    Pancreatic cancer Neg Hx    Stomach cancer Neg Hx     Social History:  reports that he quit smoking about 35 years ago. His smoking use included cigarettes. He has never used smokeless tobacco. He reports current alcohol use of about 3.0 standard drinks of alcohol per week. He reports that he does not use drugs.  ROS: All other review of systems were reviewed and are negative except what is noted above in HPI  Physical Exam: BP (!) 139/91   Pulse 76   Constitutional:  Alert and  oriented, No acute distress. HEENT: [place] AT, moist mucus membranes.  Trachea midline, no masses. Cardiovascular: No clubbing, cyanosis, or edema. Respiratory: Normal respiratory effort, no increased work of breathing. GI: Abdomen is soft, nontender, nondistended, no abdominal masses GU: No CVA tenderness.  Lymph: No cervical or inguinal lymphadenopathy. Skin: No rashes, bruises or suspicious lesions. Neurologic: Grossly intact, no focal deficits, moving all 4 extremities. Psychiatric: Normal mood and affect.  Laboratory Data: Lab Results  Component Value Date   WBC 8.9 07/12/2022  HGB 14.6 07/12/2022   HCT 42.4 07/12/2022   MCV 89.5 07/12/2022   PLT 247 07/12/2022    Lab Results  Component Value Date   CREATININE 0.81 07/12/2022    Lab Results  Component Value Date   PSA 1.65 04/06/2014    No results found for: "TESTOSTERONE"  No results found for: "HGBA1C"  Urinalysis    Component Value Date/Time   COLORURINE YELLOW (A) 02/28/2022 2027   APPEARANCEUR CLEAR (A) 02/28/2022 2027   APPEARANCEUR Clear 02/23/2021 1106   LABSPEC 1.030 02/28/2022 2027   PHURINE 5.0 02/28/2022 2027   GLUCOSEU NEGATIVE 02/28/2022 2027   HGBUR NEGATIVE 02/28/2022 2027   BILIRUBINUR NEGATIVE 02/28/2022 2027   BILIRUBINUR Negative 02/23/2021 1106   KETONESUR NEGATIVE 02/28/2022 2027   PROTEINUR 30 (A) 02/28/2022 2027   UROBILINOGEN 0.2 11/27/2013 1124   NITRITE NEGATIVE 02/28/2022 2027   LEUKOCYTESUR NEGATIVE 02/28/2022 2027    Lab Results  Component Value Date   LABMICR See below: 02/23/2021   WBCUA None seen 02/23/2021   LABEPIT None seen 02/23/2021   BACTERIA NONE SEEN 02/28/2022    Pertinent Imaging:  Results for orders placed during the hospital encounter of 09/21/05  DG Abd 1 View  Narrative Clinical Data: Evaluate for retained pancreatic duct stent. ABDOMEN - 1 VIEW - 09/21/05: (Additional 1 view abdomen obtained to include the entire abdomen). Findings: Negative for  retained pancreatic duct stent. Unremarkable bowel gas pattern.  Impression No evidence of retained pancreatic duct stent.  Provider: Jennye Boroughs  No results found for this or any previous visit.  No results found for this or any previous visit.  No results found for this or any previous visit.  No results found for this or any previous visit.  No valid procedures specified. No results found for this or any previous visit.  No results found for this or any previous visit.   Assessment & Plan:    1. Benign prostatic hyperplasia, unspecified whether lower urinary tract symptoms present -continue tadalafil daily - Urinalysis, Routine w reflex microscopic  2. Nocturia -we will trial mirabegron '25mg'$  daily - Urinalysis, Routine w reflex microscopic  3. Erectile dysfunction due to arterial insufficiency -tadalafil 2.'5mg'$  daily   No follow-ups on file.  Nicolette Bang, MD  [facility] [place]

## 2022-09-19 NOTE — Patient Instructions (Signed)

## 2022-11-16 ENCOUNTER — Other Ambulatory Visit: Payer: Self-pay | Admitting: Orthopaedic Surgery

## 2022-11-16 DIAGNOSIS — M75100 Unspecified rotator cuff tear or rupture of unspecified shoulder, not specified as traumatic: Secondary | ICD-10-CM

## 2022-11-16 DIAGNOSIS — M75102 Unspecified rotator cuff tear or rupture of left shoulder, not specified as traumatic: Secondary | ICD-10-CM

## 2022-11-27 ENCOUNTER — Encounter: Payer: Self-pay | Admitting: Orthopaedic Surgery

## 2022-11-28 ENCOUNTER — Other Ambulatory Visit: Payer: Self-pay | Admitting: Orthopaedic Surgery

## 2022-11-28 ENCOUNTER — Ambulatory Visit
Admission: RE | Admit: 2022-11-28 | Discharge: 2022-11-28 | Disposition: A | Discharge status: Home / Self Care | Payer: No Typology Code available for payment source | Source: Ambulatory Visit | Attending: Orthopaedic Surgery | Admitting: Orthopaedic Surgery

## 2022-11-28 ENCOUNTER — Ambulatory Visit
Admission: RE | Admit: 2022-11-28 | Discharge: 2022-11-28 | Disposition: A | Discharge status: Home / Self Care | Payer: Self-pay | Source: Ambulatory Visit | Attending: Orthopaedic Surgery | Admitting: Orthopaedic Surgery

## 2022-11-28 DIAGNOSIS — M75100 Unspecified rotator cuff tear or rupture of unspecified shoulder, not specified as traumatic: Secondary | ICD-10-CM

## 2022-11-28 DIAGNOSIS — M75102 Unspecified rotator cuff tear or rupture of left shoulder, not specified as traumatic: Secondary | ICD-10-CM

## 2022-11-28 MED ORDER — IOPAMIDOL (ISOVUE-M 200) INJECTION 41%
15.0000 mL | Freq: Once | INTRAMUSCULAR | Status: AC
  Administered 2022-11-28: 15 mL via INTRA_ARTICULAR

## 2023-07-27 ENCOUNTER — Ambulatory Visit: Payer: No Typology Code available for payment source | Admitting: Urology

## 2023-07-27 VITALS — BP 103/66 | HR 99

## 2023-07-27 DIAGNOSIS — N5201 Erectile dysfunction due to arterial insufficiency: Secondary | ICD-10-CM | POA: Diagnosis not present

## 2023-07-27 DIAGNOSIS — R351 Nocturia: Secondary | ICD-10-CM | POA: Diagnosis not present

## 2023-07-27 DIAGNOSIS — N400 Enlarged prostate without lower urinary tract symptoms: Secondary | ICD-10-CM

## 2023-07-27 LAB — URINALYSIS, ROUTINE W REFLEX MICROSCOPIC
Bilirubin, UA: NEGATIVE
Glucose, UA: NEGATIVE
Ketones, UA: NEGATIVE
Leukocytes,UA: NEGATIVE
Nitrite, UA: NEGATIVE
Protein,UA: NEGATIVE
RBC, UA: NEGATIVE
Specific Gravity, UA: 1.01 (ref 1.005–1.030)
Urobilinogen, Ur: 0.2 mg/dL (ref 0.2–1.0)
pH, UA: 6.5 (ref 5.0–7.5)

## 2023-07-27 LAB — BLADDER SCAN AMB NON-IMAGING: Scan Result: 29

## 2023-07-27 MED ORDER — TAMSULOSIN HCL 0.4 MG PO CAPS: 0.4000 mg | ORAL_CAPSULE | Freq: Every day | ORAL | 11 refills | Status: AC

## 2023-07-27 NOTE — Progress Notes (Signed)
post void residual=29 

## 2023-07-27 NOTE — Progress Notes (Signed)
07/27/2023 12:55 PM   William [name] [birth year] [id number]  Referring provider: Jerl Mina, MD [address] [place],  [place] [address]  Followup BPH   HPI: William [name] is a 57yo here for followup for BPH and erectile dysfunction. Since last visit his stream is weaker, he has straining to urinate. IPSS 21 QOL 3 on tadalafil 2.5mg  daily. He has nasal congestion with tadalafil 5mg . He recalls flomax helping in the past. He is very bothered by his nocturia. Nocturia 4-5x.    PMH: Past Medical History:  Diagnosis Date   Aortic atherosclerosis ([facility])    COVID    10-09-2019   Difficulty sleeping    TAKES ADVIL PM   Diverticulitis of intestine with perforation and abscess    Diverticulosis    Dysuria    ED (erectile dysfunction)    Family history of adverse reaction to anesthesia    sister  and mom PONV   GERD (gastroesophageal reflux disease)    Headache    Hyperlipidemia    Hypertension    Internal hemorrhoids    Osteoarthritis    Pancreatitis, acute    PONV (postoperative nausea and vomiting)    Prostatitis    Reflux    Tubular adenoma of colon    Volvulus of intestine ([facility]) 05/2021    Surgical History: Past Surgical History:  Procedure Laterality Date   ABCESS DRAINAGE     ABDOMINAL   APPENDECTOMY     CHOLECYSTECTOMY     COLONOSCOPY N/A 06/09/2015   Procedure: COLONOSCOPY;  Surgeon: Corbin Ade, MD;  [facility];  Service: Endoscopy;  Laterality: N/A;  11:30 Am   HERNIA REPAIR     INGUINAL HERNIA REPAIR Left 07/06/2022   Procedure: LAPAROSCOPIC POSSIBLE OPEN LEFT INGUINAL HERNIA REPAIR;  Surgeon: Axel Filler, MD;  Location: [facility];  Service: General;  Laterality: Left;   KNEE SURGERY     Both   LAPAROSCOPIC LYSIS OF ADHESIONS N/A 08/19/2020   Procedure: LAPAROSCOPIC LYSIS OF ADHESIONS;  Surgeon: Darnell Level, MD;  [facility];  Service: General;  Laterality: N/A;   LAPAROSCOPIC SIGMOID COLECTOMY N/A 11/17/2013    Procedure: LAPAROSCOPIC SIGMOID COLECTOMY rigid proctoscopy;  Surgeon: Mariella Saa, MD;  [facility];  Service: General;  Laterality: N/A;   LAPAROSCOPY N/A 08/19/2020   Procedure: LAPAROSCOPY DIAGNOSTIC AND OPEN LEFT INGIUNAL HERNIA REPAIR WITH MESH;  Surgeon: Darnell Level, MD;  [facility];  Service: General;  Laterality: N/A;   LAPAROTOMY N/A 06/01/2021   Procedure: EXPLORATORY LAPAROTOMY;  Surgeon: Carolan Shiver, MD;  Location: [facility];  Service: General;  Laterality: N/A;   SHOULDER ARTHROSCOPY     x3   SHOULDER SURGERY Left May 2016   SPHINCTEROTOMY     for sphincter of Oddi dysfunction   TONSILLECTOMY     VENTRAL HERNIA REPAIR N/A 07/06/2022   Procedure: LAPAROSCOPIC VENTRAL HERNIA REPAIR WITH MESH;  Surgeon: Axel Filler, MD;  [facility];  Service: General;  Laterality: N/A;    Home Medications:  Allergies as of 07/27/2023       Reactions   Augmentin [amoxicillin-pot Clavulanate] Diarrhea   Acyclovir And Related Swelling   Mouth swelling and sores   Biaxin [clarithromycin]    Headache and vomiting,sores in mouth   Flomax [tamsulosin Hcl]    Headache, dizziness   Other Nausea And Vomiting   anesthesia        Medication List        Accurate  as of July 27, 2023 12:55 PM. If you have any questions, ask your nurse or doctor.          ALIVE MENS ENERGY PO Take 1 tablet by mouth daily.   amLODipine 10 MG tablet Commonly known as: NORVASC Take 1 tablet (10 mg total) by mouth at bedtime.   fluticasone 50 MCG/ACT nasal spray Commonly known as: FLONASE Use 2 spray(s) in each nostril once daily What changed: See the new instructions.   hyoscyamine 0.125 MG SL tablet Commonly known as: LEVSIN SL Place 1 tablet (0.125 mg total) under the tongue every 6 (six) hours as needed (take 1-2 every 6 hours as needed).   losartan 50 MG tablet Commonly known as: COZAAR Take 50 mg by mouth daily.   lovastatin 40 MG tablet Commonly known  as: MEVACOR Take 1 tablet (40 mg total) by mouth at bedtime.   mirabegron ER 25 MG Tb24 tablet Commonly known as: MYRBETRIQ Take 1 tablet (25 mg total) by mouth daily.   neomycin-polymyxin-hydrocortisone 3.5-10000-1 OTIC suspension Commonly known as: CORTISPORIN Place 3 drops in the affected ear four times daily What changed:  how much to take how to take this when to take this reasons to take this additional instructions   omeprazole 40 MG capsule Commonly known as: PRILOSEC Take 1 capsule (40 mg total) by mouth daily.   tadalafil 5 MG tablet Commonly known as: CIALIS Take 1 tablet (5 mg total) by mouth daily.   zinc gluconate 50 MG tablet Take 50 mg by mouth daily.   zolpidem 10 MG tablet Commonly known as: AMBIEN Take 10 mg by mouth at bedtime.        Allergies:  Allergies  Allergen Reactions   Augmentin [Amoxicillin-Pot Clavulanate] Diarrhea   Acyclovir And Related Swelling    Mouth swelling and sores   Biaxin [Clarithromycin]     Headache and vomiting,sores in mouth   Flomax [Tamsulosin Hcl]     Headache, dizziness   Other Nausea And Vomiting    anesthesia     Family History: Family History  Problem Relation Age of Onset   Cancer Father        ? Colon mass removed    Ulcerative colitis Sister    Colon cancer Maternal Grandmother        69s   Esophageal cancer Neg Hx    Pancreatic cancer Neg Hx    Stomach cancer Neg Hx     Social History:  reports that he quit smoking about 36 years ago. His smoking use included cigarettes. He started smoking about 38 years ago. He has never used smokeless tobacco. He reports current alcohol use of about 3.0 standard drinks of alcohol per week. He reports that he does not use drugs.  ROS: All other review of systems were reviewed and are negative except what is noted above in HPI  Physical Exam: BP 103/66   Pulse 99   Constitutional:  Alert and oriented, No acute distress. HEENT: [place] AT, moist mucus  membranes.  Trachea midline, no masses. Cardiovascular: No clubbing, cyanosis, or edema. Respiratory: Normal respiratory effort, no increased work of breathing. GI: Abdomen is soft, nontender, nondistended, no abdominal masses GU: No CVA tenderness.  Lymph: No cervical or inguinal lymphadenopathy. Skin: No rashes, bruises or suspicious lesions. Neurologic: Grossly intact, no focal deficits, moving all 4 extremities. Psychiatric: Normal mood and affect.  Laboratory Data: Lab Results  Component Value Date   WBC 8.9 07/12/2022   HGB 14.6 07/12/2022  HCT 42.4 07/12/2022   MCV 89.5 07/12/2022   PLT 247 07/12/2022    Lab Results  Component Value Date   CREATININE 0.81 07/12/2022    Lab Results  Component Value Date   PSA 1.65 04/06/2014    No results found for: "TESTOSTERONE"  No results found for: "HGBA1C"  Urinalysis    Component Value Date/Time   COLORURINE YELLOW (A) 02/28/2022 2027   APPEARANCEUR Clear 09/19/2022 1338   LABSPEC 1.030 02/28/2022 2027   PHURINE 5.0 02/28/2022 2027   GLUCOSEU Negative 09/19/2022 1338   HGBUR NEGATIVE 02/28/2022 2027   BILIRUBINUR Negative 09/19/2022 1338   KETONESUR NEGATIVE 02/28/2022 2027   PROTEINUR Negative 09/19/2022 1338   PROTEINUR 30 (A) 02/28/2022 2027   UROBILINOGEN 0.2 11/27/2013 1124   NITRITE Negative 09/19/2022 1338   NITRITE NEGATIVE 02/28/2022 2027   LEUKOCYTESUR Negative 09/19/2022 1338   LEUKOCYTESUR NEGATIVE 02/28/2022 2027    Lab Results  Component Value Date   LABMICR Comment 09/19/2022   WBCUA None seen 02/23/2021   LABEPIT None seen 02/23/2021   BACTERIA NONE SEEN 02/28/2022    Pertinent Imaging:  Results for orders placed during the hospital encounter of 09/21/05  DG Abd 1 View  Narrative Clinical Data: Evaluate for retained pancreatic duct stent. ABDOMEN - 1 VIEW - 09/21/05: (Additional 1 view abdomen obtained to include the entire abdomen). Findings: Negative for retained pancreatic duct  stent. Unremarkable bowel gas pattern.  Impression No evidence of retained pancreatic duct stent.  Provider: Wilkie Aye  No results found for this or any previous visit.  No results found for this or any previous visit.  No results found for this or any previous visit.  No results found for this or any previous visit.  No valid procedures specified. No results found for this or any previous visit.  No results found for this or any previous visit.   Assessment & Plan:    1. Benign prostatic hyperplasia, unspecified whether lower urinary tract symptoms present -we will restart flomax 0.4mg  daily - Urinalysis, Routine w reflex microscopic - BLADDER SCAN AMB NON-IMAGING  2. Nocturia -flomax 0.4mg  daily  3. Erectile dysfunction due to arterial insufficiency Tadalafil 5mg  daily   No follow-ups on file.  Wilkie Aye, MD  [facility] [place]

## 2023-07-31 ENCOUNTER — Encounter: Payer: Self-pay | Admitting: Urology

## 2023-07-31 NOTE — Patient Instructions (Signed)

## 2023-08-27 ENCOUNTER — Encounter: Payer: Self-pay | Admitting: Urology

## 2023-09-19 ENCOUNTER — Encounter: Payer: Self-pay | Admitting: Urology

## 2023-09-19 ENCOUNTER — Ambulatory Visit: Payer: No Typology Code available for payment source | Admitting: Urology

## 2023-09-19 VITALS — BP 141/87 | HR 81

## 2023-09-19 DIAGNOSIS — N400 Enlarged prostate without lower urinary tract symptoms: Secondary | ICD-10-CM

## 2023-09-19 LAB — URINALYSIS, ROUTINE W REFLEX MICROSCOPIC
Bilirubin, UA: NEGATIVE
Glucose, UA: NEGATIVE
Ketones, UA: NEGATIVE
Leukocytes,UA: NEGATIVE
Nitrite, UA: NEGATIVE
Protein,UA: NEGATIVE
RBC, UA: NEGATIVE
Specific Gravity, UA: 1.01 (ref 1.005–1.030)
Urobilinogen, Ur: 0.2 mg/dL (ref 0.2–1.0)
pH, UA: 6.5 (ref 5.0–7.5)

## 2023-09-19 MED ORDER — CIPROFLOXACIN HCL 500 MG PO TABS
500.0000 mg | ORAL_TABLET | Freq: Once | ORAL | Status: AC
  Administered 2023-09-19: 500 mg via ORAL

## 2023-09-19 NOTE — Progress Notes (Signed)
   09/19/23  CC: difficulty urinating   HPI: William [name] is a 57yo here for cystoscopy for difficulty urinating Blood pressure (!) 141/87, pulse 81. NED. A&Ox3.   No respiratory distress   Abd soft, NT, ND Normal phallus with bilateral descended testicles  Cystoscopy Procedure Note  Patient identification was confirmed, informed consent was obtained, and patient was prepped using Betadine solution.  Lidocaine jelly was administered per urethral meatus.     Pre-Procedure: - Inspection reveals a normal caliber ureteral meatus.  Procedure: The flexible cystoscope was introduced without difficulty - No urethral strictures/lesions are present. - Enlarged prostate no median lobe - Normal bladder neck - Bilateral ureteral orifices identified - Bladder mucosa  reveals no ulcers, tumors, or lesions - No bladder stones - No trabeculation    Post-Procedure: - Patient tolerated the procedure well  Assessment/ Plan: We discussed the management of his BPH including continued medical therapy, Rezum, Urolift, TURP and simple prostatectomy. After discussing the options the patient has elected to proceed with Urolift. Risks/benefits/alternatives discussed.   No follow-ups on file.  Wilkie Aye, MD

## 2023-09-25 ENCOUNTER — Encounter: Payer: Self-pay | Admitting: Urology

## 2023-09-25 NOTE — Patient Instructions (Signed)

## 2023-10-27 ENCOUNTER — Other Ambulatory Visit: Payer: Self-pay | Admitting: Medical Genetics

## 2023-11-23 ENCOUNTER — Other Ambulatory Visit
Admission: RE | Admit: 2023-11-23 | Discharge: 2023-11-23 | Disposition: A | Discharge status: Home / Self Care | Payer: Self-pay | Source: Ambulatory Visit | Attending: Medical Genetics | Admitting: Medical Genetics

## 2023-11-26 ENCOUNTER — Other Ambulatory Visit: Payer: Self-pay

## 2023-11-26 ENCOUNTER — Emergency Department: Payer: No Typology Code available for payment source

## 2023-11-26 ENCOUNTER — Inpatient Hospital Stay: Payer: No Typology Code available for payment source

## 2023-11-26 ENCOUNTER — Inpatient Hospital Stay
Admission: EM | Admit: 2023-11-26 | Discharge: 2023-12-03 | DRG: 390 | Disposition: A | Discharge status: Home / Self Care | Payer: No Typology Code available for payment source | Attending: Surgery | Admitting: Surgery

## 2023-11-26 DIAGNOSIS — Z79899 Other long term (current) drug therapy: Secondary | ICD-10-CM | POA: Diagnosis not present

## 2023-11-26 DIAGNOSIS — K5651 Intestinal adhesions [bands], with partial obstruction: Principal | ICD-10-CM | POA: Diagnosis present

## 2023-11-26 DIAGNOSIS — Z800 Family history of malignant neoplasm of digestive organs: Secondary | ICD-10-CM

## 2023-11-26 DIAGNOSIS — K219 Gastro-esophageal reflux disease without esophagitis: Secondary | ICD-10-CM | POA: Diagnosis present

## 2023-11-26 DIAGNOSIS — K56609 Unspecified intestinal obstruction, unspecified as to partial versus complete obstruction: Principal | ICD-10-CM | POA: Diagnosis present

## 2023-11-26 DIAGNOSIS — Z881 Allergy status to other antibiotic agents status: Secondary | ICD-10-CM

## 2023-11-26 DIAGNOSIS — I10 Essential (primary) hypertension: Secondary | ICD-10-CM | POA: Diagnosis present

## 2023-11-26 DIAGNOSIS — Z888 Allergy status to other drugs, medicaments and biological substances status: Secondary | ICD-10-CM | POA: Diagnosis not present

## 2023-11-26 DIAGNOSIS — Z8249 Family history of ischemic heart disease and other diseases of the circulatory system: Secondary | ICD-10-CM | POA: Diagnosis not present

## 2023-11-26 DIAGNOSIS — Z880 Allergy status to penicillin: Secondary | ICD-10-CM | POA: Diagnosis not present

## 2023-11-26 DIAGNOSIS — Z87891 Personal history of nicotine dependence: Secondary | ICD-10-CM | POA: Diagnosis not present

## 2023-11-26 DIAGNOSIS — Z9049 Acquired absence of other specified parts of digestive tract: Secondary | ICD-10-CM | POA: Diagnosis not present

## 2023-11-26 DIAGNOSIS — R109 Unspecified abdominal pain: Secondary | ICD-10-CM | POA: Diagnosis present

## 2023-11-26 LAB — CBC
HCT: 46.1 % (ref 39.0–52.0)
HCT: 44.8 % (ref 39.0–52.0)
Hemoglobin: 16 g/dL (ref 13.0–17.0)
Hemoglobin: 15.9 g/dL (ref 13.0–17.0)
MCH: 30.5 pg (ref 26.0–34.0)
MCH: 30.2 pg (ref 26.0–34.0)
MCHC: 34.7 g/dL (ref 30.0–36.0)
MCHC: 35.5 g/dL (ref 30.0–36.0)
MCV: 87.8 fL (ref 80.0–100.0)
MCV: 85.2 fL (ref 80.0–100.0)
Platelets: 280 10*3/uL (ref 150–400)
Platelets: 282 10*3/uL (ref 150–400)
RBC: 5.25 MIL/uL (ref 4.22–5.81)
RBC: 5.26 MIL/uL (ref 4.22–5.81)
RDW: 13.3 % (ref 11.5–15.5)
RDW: 13.3 % (ref 11.5–15.5)
WBC: 10.8 10*3/uL — ABNORMAL HIGH (ref 4.0–10.5)
WBC: 10.6 10*3/uL — ABNORMAL HIGH (ref 4.0–10.5)
nRBC: 0 % (ref 0.0–0.2)
nRBC: 0 % (ref 0.0–0.2)

## 2023-11-26 LAB — COMPREHENSIVE METABOLIC PANEL
ALT: 41 U/L (ref 0–44)
AST: 43 U/L — ABNORMAL HIGH (ref 15–41)
Albumin: 4.9 g/dL (ref 3.5–5.0)
Alkaline Phosphatase: 66 U/L (ref 38–126)
Anion gap: 12 (ref 5–15)
BUN: 17 mg/dL (ref 6–20)
CO2: 26 mmol/L (ref 22–32)
Calcium: 9.8 mg/dL (ref 8.9–10.3)
Chloride: 103 mmol/L (ref 98–111)
Creatinine, Ser: 0.83 mg/dL (ref 0.61–1.24)
GFR, Estimated: >60 mL/min (ref >60)
Glucose, Bld: 114 mg/dL — ABNORMAL HIGH (ref 70–99)
Potassium: 3.7 mmol/L (ref 3.5–5.1)
Sodium: 141 mmol/L (ref 135–145)
Total Bilirubin: 0.7 mg/dL (ref 0.0–1.2)
Total Protein: 7.9 g/dL (ref 6.5–8.1)

## 2023-11-26 LAB — URINALYSIS, ROUTINE W REFLEX MICROSCOPIC
Bacteria, UA: NONE SEEN
Bilirubin Urine: NEGATIVE
Glucose, UA: NEGATIVE mg/dL
Hgb urine dipstick: NEGATIVE
Ketones, ur: 5 mg/dL — AB
Leukocytes,Ua: NEGATIVE
Nitrite: NEGATIVE
Protein, ur: 30 mg/dL — AB
Specific Gravity, Urine: >1.046 — ABNORMAL HIGH (ref 1.005–1.030)
Squamous Epithelial / HPF: 0 /[HPF] (ref 0–5)
pH: 5 (ref 5.0–8.0)

## 2023-11-26 LAB — CREATININE, SERUM
Creatinine, Ser: 0.77 mg/dL (ref 0.61–1.24)
GFR, Estimated: >60 mL/min (ref >60)

## 2023-11-26 LAB — HIV ANTIBODY (ROUTINE TESTING W REFLEX): HIV Screen 4th Generation wRfx: NONREACTIVE

## 2023-11-26 LAB — LIPASE, BLOOD: Lipase: 29 U/L (ref 11–51)

## 2023-11-26 MED ORDER — ENOXAPARIN SODIUM 40 MG/0.4ML IJ SOSY
40.0000 mg | PREFILLED_SYRINGE | INTRAMUSCULAR | Status: DC
  Administered 2023-11-28 – 2023-12-03 (×6): 40 mg via SUBCUTANEOUS
  Filled 2023-11-26 (×6): qty 0.4

## 2023-11-26 MED ORDER — ZOLPIDEM TARTRATE 5 MG PO TABS
10.0000 mg | ORAL_TABLET | Freq: Every day | ORAL | Status: DC
  Administered 2023-11-26 – 2023-11-29 (×3): 10 mg
  Filled 2023-11-26 (×4): qty 2

## 2023-11-26 MED ORDER — ONDANSETRON 4 MG PO TBDP: 4.0000 mg | ORAL_TABLET | Freq: Four times a day (QID) | ORAL | Status: DC | PRN

## 2023-11-26 MED ORDER — LOSARTAN POTASSIUM 50 MG PO TABS
50.0000 mg | ORAL_TABLET | Freq: Every day | ORAL | Status: DC
  Administered 2023-11-28 – 2023-11-29 (×3): 50 mg
  Filled 2023-11-26 (×3): qty 1

## 2023-11-26 MED ORDER — PHENOL 1.4 % MT LIQD
1.0000 | OROMUCOSAL | Status: DC | PRN
  Administered 2023-11-26: 1 via OROMUCOSAL
  Filled 2023-11-26: qty 177

## 2023-11-26 MED ORDER — DIATRIZOATE MEGLUMINE & SODIUM 66-10 % PO SOLN: 90.0000 mL | Freq: Once | ORAL | Status: DC

## 2023-11-26 MED ORDER — ONDANSETRON HCL 4 MG/2ML IJ SOLN
4.0000 mg | Freq: Four times a day (QID) | INTRAMUSCULAR | Status: DC | PRN
  Administered 2023-11-26 – 2023-11-27 (×3): 4 mg via INTRAVENOUS
  Filled 2023-11-26 (×3): qty 2

## 2023-11-26 MED ORDER — MORPHINE SULFATE (PF) 4 MG/ML IV SOLN
4.0000 mg | Freq: Once | INTRAVENOUS | Status: AC
  Administered 2023-11-26: 4 mg via INTRAVENOUS
  Filled 2023-11-26: qty 1

## 2023-11-26 MED ORDER — IOHEXOL 300 MG/ML  SOLN
100.0000 mL | Freq: Once | INTRAMUSCULAR | Status: AC | PRN
  Administered 2023-11-26: 100 mL via INTRAVENOUS

## 2023-11-26 MED ORDER — FLUTICASONE PROPIONATE 50 MCG/ACT NA SUSP
1.0000 | Freq: Every day | NASAL | Status: DC
  Administered 2023-12-01 – 2023-12-03 (×3): 1 via NASAL
  Filled 2023-11-26 (×2): qty 16

## 2023-11-26 MED ORDER — LOSARTAN POTASSIUM 50 MG PO TABS: 50.0000 mg | ORAL_TABLET | Freq: Every day | ORAL | Status: DC

## 2023-11-26 MED ORDER — SODIUM CHLORIDE 0.9 % IV SOLN: INTRAVENOUS | Status: DC

## 2023-11-26 MED ORDER — AMLODIPINE BESYLATE 10 MG PO TABS
10.0000 mg | ORAL_TABLET | Freq: Every day | ORAL | Status: DC
  Administered 2023-11-26 – 2023-11-29 (×4): 10 mg
  Filled 2023-11-26 (×4): qty 1

## 2023-11-26 MED ORDER — ONDANSETRON HCL 4 MG/2ML IJ SOLN
4.0000 mg | Freq: Once | INTRAMUSCULAR | Status: AC
  Administered 2023-11-26: 4 mg via INTRAVENOUS
  Filled 2023-11-26: qty 2

## 2023-11-26 MED ORDER — DIATRIZOATE MEGLUMINE & SODIUM 66-10 % PO SOLN
90.0000 mL | Freq: Once | ORAL | Status: AC
  Administered 2023-11-26: 90 mL

## 2023-11-26 MED ORDER — KETOROLAC TROMETHAMINE 30 MG/ML IJ SOLN
30.0000 mg | Freq: Once | INTRAMUSCULAR | Status: AC
  Administered 2023-11-26: 30 mg via INTRAVENOUS
  Filled 2023-11-26: qty 1

## 2023-11-26 MED ORDER — HYDROMORPHONE HCL 1 MG/ML IJ SOLN
0.5000 mg | INTRAMUSCULAR | Status: DC | PRN
  Administered 2023-11-26 (×3): 0.5 mg via INTRAVENOUS
  Filled 2023-11-26 (×3): qty 0.5

## 2023-11-26 NOTE — H&P (Signed)
 Subjective:   CC: SBO  HPI:  William [name] is a 58 y.o. male who was consulted by Martina Sledge for issue above.  Symptoms were first noted a few days ago. Pain is sharp, confined to the epigastric, without radiation.  Associated with N/V, exacerbated by nothing specific.  Hx of SBO in past, s/p ex-lap, LOA.     Past Medical History:  has a past medical history of Aortic atherosclerosis (HCC), COVID, Difficulty sleeping, Diverticulitis of intestine with perforation and abscess, Diverticulosis, Dysuria, ED (erectile dysfunction), Family history of adverse reaction to anesthesia, GERD (gastroesophageal reflux disease), Headache, Hyperlipidemia, Hypertension, Internal hemorrhoids, Osteoarthritis, Pancreatitis, acute, PONV (postoperative nausea and vomiting), Prostatitis, Reflux, Tubular adenoma of colon, and Volvulus of intestine (HCC) (05/2021).  Past Surgical History:  Past Surgical History:  Procedure Laterality Date   ABCESS DRAINAGE     ABDOMINAL   APPENDECTOMY     CHOLECYSTECTOMY     COLONOSCOPY N/A 06/09/2015   Procedure: COLONOSCOPY;  Surgeon: Suzette Espy, MD;  Location: [facility];  Service: Endoscopy;  Laterality: N/A;  11:30 Am   HERNIA REPAIR     INGUINAL HERNIA REPAIR Left 07/06/2022   Procedure: LAPAROSCOPIC POSSIBLE OPEN LEFT INGUINAL HERNIA REPAIR;  Surgeon: Shela Derby, MD;  Location: [facility] OR;  Service: General;  Laterality: Left;   KNEE SURGERY     Both   LAPAROSCOPIC LYSIS OF ADHESIONS N/A 08/19/2020   Procedure: LAPAROSCOPIC LYSIS OF ADHESIONS;  Surgeon: Oralee Billow, MD;  Location: [facility] ORS;  Service: General;  Laterality: N/A;   LAPAROSCOPIC SIGMOID COLECTOMY N/A 11/17/2013   Procedure: LAPAROSCOPIC SIGMOID COLECTOMY rigid proctoscopy;  Surgeon: Quitman Bucy, MD;  Location: [facility];  Service: General;  Laterality: N/A;   LAPAROSCOPY N/A 08/19/2020   Procedure: LAPAROSCOPY DIAGNOSTIC AND OPEN LEFT INGIUNAL HERNIA REPAIR WITH MESH;  Surgeon: Oralee Billow, MD;   Location: [facility];  Service: General;  Laterality: N/A;   LAPAROTOMY N/A 06/01/2021   Procedure: EXPLORATORY LAPAROTOMY;  Surgeon: Eldred Grego, MD;  Location: [facility] ORS;  Service: General;  Laterality: N/A;   SHOULDER ARTHROSCOPY     x3   SHOULDER SURGERY Left May 2016   SPHINCTEROTOMY     for sphincter of Oddi dysfunction   TONSILLECTOMY     VENTRAL HERNIA REPAIR N/A 07/06/2022   Procedure: LAPAROSCOPIC VENTRAL HERNIA REPAIR WITH MESH;  Surgeon: Shela Derby, MD;  Location: [facility] OR;  Service: General;  Laterality: N/A;    Family History: family history includes Cancer in his father; Colon cancer in his maternal grandmother; Ulcerative colitis in his sister.  Social History:  reports that he quit smoking about 37 years ago. His smoking use included cigarettes. He started smoking about 39 years ago. He has never used smokeless tobacco. He reports current alcohol use of about 3.0 standard drinks of alcohol per week. He reports that he does not use drugs.  Current Medications:  Prior to Admission medications   Medication Sig Start Date End Date Taking? Authorizing Provider  amLODipine  (NORVASC ) 10 MG tablet Take 1 tablet (10 mg total) by mouth at bedtime. 08/10/20  Yes Carolynne Citron, Malena M, DO  fluticasone  (FLONASE ) 50 MCG/ACT nasal spray Use 2 spray(s) in each nostril once daily 05/19/21  Yes Carolynne Citron, Malena M, DO  losartan  (COZAAR ) 50 MG tablet Take 50 mg by mouth daily.   Yes [provider]  lovastatin  (MEVACOR ) 40 MG tablet Take 1 tablet (40 mg total) by mouth at bedtime. 07/29/20  Yes Glena Landau M, DO  Multiple  Vitamins-Minerals (ALIVE MENS ENERGY PO) Take 1 tablet by mouth daily.   Yes [provider]  omeprazole  (PRILOSEC) 40 MG capsule Take 1 capsule (40 mg total) by mouth daily. 07/12/20  Yes Carolynne Citron, Malena M, DO  Semaglutide-Weight Management 0.25 MG/0.5ML SOAJ Inject 0.25 mg into the skin every 7 (seven) days.   Yes [provider]  zolpidem  (AMBIEN )  10 MG tablet Take 10 mg by mouth at bedtime. 06/07/22  Yes [provider]  Eszopiclone 3 MG TABS Take 3 mg by mouth at bedtime. Patient not taking: Reported on 11/26/2023 09/05/23   [provider]  hyoscyamine  (LEVSIN SL) 0.125 MG SL tablet Place 1 tablet (0.125 mg total) under the tongue every 6 (six) hours as needed (take 1-2 every 6 hours as needed). Patient not taking: Reported on 11/26/2023 04/25/22   Nannette Babe, MD  mirabegron  ER (MYRBETRIQ ) 25 MG TB24 tablet Take 1 tablet (25 mg total) by mouth daily. Patient not taking: Reported on 11/26/2023 09/19/22   Marco Severs, MD  neomycin -polymyxin-hydrocortisone (CORTISPORIN) 3.5-10000-1 OTIC suspension Place 3 drops in the affected ear four times daily Patient not taking: Reported on 11/26/2023 04/14/20   Sherwood Donath, DO  tadalafil  (CIALIS ) 5 MG tablet Take 1 tablet (5 mg total) by mouth daily. Patient not taking: Reported on 11/26/2023 09/19/22   Marco Severs, MD  tamsulosin  (FLOMAX ) 0.4 MG CAPS capsule Take 1 capsule (0.4 mg total) by mouth daily after supper. Patient not taking: Reported on 11/26/2023 07/27/23   Marco Severs, MD  zinc gluconate 50 MG tablet Take 50 mg by mouth daily. Patient not taking: Reported on 11/26/2023    [provider]  zolpidem  (AMBIEN  CR) 12.5 MG CR tablet Take 12.5 mg by mouth at bedtime as needed. Patient not taking: Reported on 11/26/2023    [provider]    Allergies:  Allergies as of 11/26/2023 - Review Complete 11/26/2023  Allergen Reaction Noted   Augmentin  [amoxicillin -pot clavulanate] Diarrhea 10/30/2018   Acyclovir and related Swelling 04/22/2018   Biaxin  [clarithromycin ]  03/16/2015   Flomax  [tamsulosin  hcl]  01/28/2018   Other Nausea And Vomiting 05/27/2015    ROS:  General: Denies weight loss, weight gain, fatigue, fevers, chills, and night sweats. Eyes: Denies blurry vision, double vision, eye pain, itchy eyes, and tearing. Ears:  Denies hearing loss, earache, and ringing in ears. Nose: Denies sinus pain, congestion, infections, runny nose, and nosebleeds. Mouth/throat: Denies hoarseness, sore throat, bleeding gums, and difficulty swallowing. Heart: Denies chest pain, palpitations, racing heart, irregular heartbeat, leg pain or swelling, and decreased activity tolerance. Respiratory: Denies breathing difficulty, shortness of breath, wheezing, cough, and sputum. GI: Denies change in appetite, heartburn, nausea, vomiting, constipation, diarrhea, and blood in stool. GU: Denies difficulty urinating, pain with urinating, urgency, frequency, blood in urine. Musculoskeletal: Denies joint stiffness, pain, swelling, muscle weakness. Skin: Denies rash, itching, mass, tumors, sores, and boils Neurologic: Denies headache, fainting, dizziness, seizures, numbness, and tingling. Psychiatric: Denies depression, anxiety, difficulty sleeping, and memory loss. Endocrine: Denies heat or cold intolerance, and increased thirst or urination. Blood/lymph: Denies easy bruising, easy bruising, and swollen glands     Objective:     BP 124/89   Pulse 77   Temp 98 F (36.7 C) (Oral)   Resp 18   Ht 6\' 1"  (1.854 m)   Wt 88.5 kg   SpO2 99%   BMI 25.73 kg/m   Constitutional :  alert, cooperative, appears stated age, and no distress  Lymphatics/Throat:  no asymmetry, masses, or scars  Respiratory:  clear to auscultation bilaterally  Cardiovascular:  regular rate and rhythm  Gastrointestinal: soft, non-tender; bowel sounds normal; no masses,  no organomegaly.   Musculoskeletal: Steady movement  Skin: Cool and moist, midline surgical scar  Psychiatric: Normal affect, non-agitated, not confused       LABS:     Latest Ref Rng & Units 11/26/2023    4:57 AM 07/12/2022    4:16 AM 07/10/2022    8:48 AM  CMP  Glucose 70 - 99 mg/dL 604  93  540   BUN 6 - 20 mg/dL 17  21  27    Creatinine 0.61 - 1.24 mg/dL 9.81  1.91  4.78   Sodium 135 - 145  mmol/L 141  145  143   Potassium 3.5 - 5.1 mmol/L 3.7  3.5  4.0   Chloride 98 - 111 mmol/L 103  102  101   CO2 22 - 32 mmol/L 26  30  28    Calcium 8.9 - 10.3 mg/dL 9.8  9.6  9.7   Total Protein 6.5 - 8.1 g/dL 7.9     Total Bilirubin 0.0 - 1.2 mg/dL 0.7     Alkaline Phos 38 - 126 U/L 66     AST 15 - 41 U/L 43     ALT 0 - 44 U/L 41         Latest Ref Rng & Units 11/26/2023    4:57 AM 07/12/2022    4:16 AM 07/10/2022    8:48 AM  CBC  WBC 4.0 - 10.5 K/uL 10.8  8.9  10.1   Hemoglobin 13.0 - 17.0 g/dL 29.5  62.1  30.8   Hematocrit 39.0 - 52.0 % 46.1  42.4  44.0   Platelets 150 - 400 K/uL 280  247  263     RADS: CLINICAL DATA:  58 year old male with abdominal pain and diarrhea with bloating. History of small-bowel obstruction.   EXAM: CT ABDOMEN AND PELVIS WITH CONTRAST   TECHNIQUE: Multidetector CT imaging of the abdomen and pelvis was performed using the standard protocol following bolus administration of intravenous contrast.   RADIATION DOSE REDUCTION: This exam was performed according to the departmental dose-optimization program which includes automated exposure control, adjustment of the mA and/or kV according to patient size and/or use of iterative reconstruction technique.   CONTRAST:  OMNIPAQUE  IOHEXOL  300 MG/ML  SOLN   COMPARISON:  CT Abdomen and Pelvis 07/08/2022.   FINDINGS: Lower chest: Negative; minor lung base atelectasis.   Hepatobiliary: Chronic cholecystectomy.  Negative liver.   Pancreas: Mass effect from dilated stomach and small bowel but otherwise negative.   Spleen: Negative.   Adrenals/Urinary Tract: Negative.   Stomach/Bowel: Decompressed rectosigmoid colon with chronic Anastomosis on series 2, image 83, no adverse features. Decompressed upstream descending and transverse colon. Mild gas and fluid in the right colon which is nondilated.   Probable previous appendectomy. Decompressed terminal ileum and air and fluid containing small  bowel loops in the right lower quadrant.   Dilated gas and fluid distended stomach, duodenum, jejunum. Abrupt transition at the level of the umbilicus to the right of midline in the deep mesentery on series 2, image 59 with mild small bowel feces sign there. Upstream dilated loops measure up to 5 cm diameter. Downstream of the transition there are some mildly dilated air in fluid containing loops up to 34 mm, but most of the downstream small bowel is decompressed.   No  free air or free fluid identified. Chronic postoperative changes to the ventral abdominal wall with no improved appearance compared to 2023. No ventral hernia identified.   Vascular/Lymphatic: Calcified aortic atherosclerosis. Normal caliber abdominal aorta. Major arterial structures in the abdomen and pelvis remain patent. Portal venous system is patent. No lymphadenopathy identified.   Reproductive: Right testicle appears chronically located in the right inguinal canal on series 2, image 98. This is new compared to 2022. No inguinal hernia.   Other: No pelvis free fluid.   Musculoskeletal: No acute osseous abnormality identified.   IMPRESSION: 1. Small Bowel Obstruction with high-grade transition in the mid abdomen at the level of the umbilicus on series 2, image 59. Previous abdominal surgeries and small-bowel obstructions, favor adhesions is the obstructing etiology. No free air or free fluid.   2. Right testicle appears chronically located in the right inguinal canal since 2023, new since 2022. Consider retractile testicle.   3.  Aortic Atherosclerosis (ICD10-I70.0).     Electronically Signed   By: Marlise Simpers M.D.   On: 11/26/2023 08:45 Assessment:   SBO-recurrent  Plan:   The patient did report a bowel movement yesterday.  This was after the initial pain and discomfort has started.  He has also recently started on a GLP-1 but the CT is concerning enough for an actual anatomical obstruction, so we will  proceed to treat as such.  NG tube replaced at bedside by myself after initial imaging noted to have tube coiled in the esophagus.  Close to 2 L of feculent output noted at time of placement.  Will proceed with small bowel follow-through and monitor patient.  History of extensive abdominal surgeries in the past so hope is to avoid any additional surgery.  The patient verbalized understanding and all questions were answered to the patient's satisfaction.  labs/images/medications/previous chart entries reviewed personally and relevant changes/updates noted above.

## 2023-11-26 NOTE — ED Triage Notes (Signed)
 Pt to ED via POV c/o abd pain and diarrhea since sat morning. Pt reports some bloating. Zofran  taken 3hrs ago. Denies CP, SHOB, dizziness, fevers

## 2023-11-26 NOTE — ED Notes (Signed)
 See triage notes. Patient c/o abdominal pain and distention since Saturday morning. The patient stated he has a hx for abdominal issues and also recently started a GLP-1.

## 2023-11-26 NOTE — ED Provider Notes (Addendum)
 [facility] Provider Note    Event Date/Time   First MD Initiated Contact with Patient 11/26/23 [phone]     (approximate)   History   Abdominal Pain   HPI  William [name] is a 58 y.o. male with a history of diverticulitis, bowel obstruction who presents with complaints of abdominal pain and bloating started over the last 24 to 48 hours.  He does report first dose of GLP 1 over a week ago but does not think that is related.     Physical Exam   Triage Vital Signs: ED Triage Vitals  Encounter Vitals Group     BP 11/26/23 0456 (!) 142/97     Systolic BP Percentile --      Diastolic BP Percentile --      Pulse Rate 11/26/23 0456 100     Resp 11/26/23 0456 18     Temp 11/26/23 0456 98.1 F (36.7 C)     Temp Source 11/26/23 0456 Oral     SpO2 11/26/23 0456 96 %     Weight 11/26/23 0455 88.5 kg (195 lb)     Height 11/26/23 0455 1.854 m (6\' 1" )     Head Circumference --      Peak Flow --      Pain Score 11/26/23 0505 9     Pain Loc --      Pain Education --      Exclude from Growth Chart --     Most recent vital signs: Vitals:   11/26/23 0456 11/26/23 0906  BP: (!) 142/97 124/89  Pulse: 100 77  Resp: 18 18  Temp: 98.1 F (36.7 C) 98 F (36.7 C)  SpO2: 96% 99%     General: Awake, no distress.  CV:  Good peripheral perfusion.  Resp:  Normal effort.  Abd:  Positive distention, diffuse tenderness to palpation  other:     ED Results / Procedures / Treatments   Labs (all labs ordered are listed, but only abnormal results are displayed) Labs Reviewed  COMPREHENSIVE METABOLIC PANEL - Abnormal; Notable for the following components:      Result Value   Glucose, Bld 114 (*)    AST 43 (*)    All other components within normal limits  CBC - Abnormal; Notable for the following components:   WBC 10.8 (*)    All other components within normal limits  LIPASE, BLOOD  URINALYSIS, ROUTINE W REFLEX MICROSCOPIC     EKG     RADIOLOGY CT  scan consistent with high-grade small bowel obstruction per radiology    PROCEDURES:  Critical Care performed:   Procedures   MEDICATIONS ORDERED IN ED: Medications  diatrizoate  meglumine -sodium (GASTROGRAFIN ) 66-10 % solution 90 mL (has no administration in time range)  morphine  (PF) 4 MG/ML injection 4 mg (4 mg Intravenous Given 11/26/23 0745)  ondansetron  (ZOFRAN ) injection 4 mg (4 mg Intravenous Given 11/26/23 0743)  iohexol  (OMNIPAQUE ) 300 MG/ML solution 100 mL (100 mLs Intravenous Contrast Given 11/26/23 0810)  morphine  (PF) 4 MG/ML injection 4 mg (4 mg Intravenous Given 11/26/23 0843)  ketorolac  (TORADOL ) 30 MG/ML injection 30 mg (30 mg Intravenous Given 11/26/23 0904)     IMPRESSION / MDM / ASSESSMENT AND PLAN / ED COURSE  I reviewed the triage vital signs and the nursing notes. Patient's presentation is most consistent with acute presentation with potential threat to life or bodily function.  Patient presents with symptoms as above, suspicious for small bowel obstruction, differential includes diverticulitis,  side effect from GLP-1, viral gastroenteritis  Will treat with IV morphine , IV Zofran , obtain CT abdomen pelvis  Lab work reviewed  CT scan is consistent with high-grade small bowel obstruction, have discussed with Dr. Rosea Conch of surgery who recommends NG tube and will see the patient        FINAL CLINICAL IMPRESSION(S) / ED DIAGNOSES   Final diagnoses:  Small bowel obstruction (HCC)     Rx / DC Orders   ED Discharge Orders     None        Note:  This document was prepared using Dragon voice recognition software and may include unintentional dictation errors.   Bryson Carbine, MD 11/26/23 0102    Bryson Carbine, MD 11/26/23 [phone]

## 2023-11-27 ENCOUNTER — Inpatient Hospital Stay: Payer: No Typology Code available for payment source

## 2023-11-27 LAB — BASIC METABOLIC PANEL
Anion gap: 18 — ABNORMAL HIGH (ref 5–15)
BUN: 28 mg/dL — ABNORMAL HIGH (ref 6–20)
CO2: 29 mmol/L (ref 22–32)
Calcium: 10 mg/dL (ref 8.9–10.3)
Chloride: 97 mmol/L — ABNORMAL LOW (ref 98–111)
Creatinine, Ser: 0.95 mg/dL (ref 0.61–1.24)
GFR, Estimated: >60 mL/min (ref >60)
Glucose, Bld: 116 mg/dL — ABNORMAL HIGH (ref 70–99)
Potassium: 3 mmol/L — ABNORMAL LOW (ref 3.5–5.1)
Sodium: 144 mmol/L (ref 135–145)

## 2023-11-27 LAB — GLUCOSE, CAPILLARY: Glucose-Capillary: 95 mg/dL (ref 70–99)

## 2023-11-27 MED ORDER — POTASSIUM CHLORIDE CRYS ER 20 MEQ PO TBCR
40.0000 meq | EXTENDED_RELEASE_TABLET | ORAL | Status: AC
  Administered 2023-11-27 – 2023-11-28 (×2): 40 meq via ORAL
  Filled 2023-11-27 (×2): qty 2

## 2023-11-27 MED ORDER — HYDROCODONE-ACETAMINOPHEN 5-325 MG PO TABS
1.0000 | ORAL_TABLET | Freq: Four times a day (QID) | ORAL | Status: DC | PRN
  Administered 2023-11-27: 1 via ORAL
  Filled 2023-11-27: qty 1

## 2023-11-27 MED ORDER — HYDROMORPHONE HCL 1 MG/ML IJ SOLN
0.5000 mg | INTRAMUSCULAR | Status: DC | PRN
  Administered 2023-11-27 – 2023-12-03 (×22): 0.5 mg via INTRAVENOUS
  Filled 2023-11-27 (×22): qty 0.5

## 2023-11-27 MED ORDER — PANTOPRAZOLE SODIUM 40 MG PO TBEC
40.0000 mg | DELAYED_RELEASE_TABLET | Freq: Every day | ORAL | Status: DC
  Administered 2023-11-27 – 2023-12-03 (×7): 40 mg via ORAL
  Filled 2023-11-27 (×7): qty 1

## 2023-11-27 MED ORDER — PROMETHAZINE (PHENERGAN) 6.25MG IN NS 50ML IVPB
6.2500 mg | Freq: Four times a day (QID) | INTRAVENOUS | Status: DC | PRN
  Administered 2023-11-27 – 2023-12-03 (×4): 6.25 mg via INTRAVENOUS
  Filled 2023-11-27: qty 6.25
  Filled 2023-11-27 (×2): qty 50
  Filled 2023-11-27: qty 6.25

## 2023-11-27 MED ORDER — KETOROLAC TROMETHAMINE 15 MG/ML IJ SOLN
15.0000 mg | Freq: Three times a day (TID) | INTRAMUSCULAR | Status: DC | PRN
  Administered 2023-11-27: 15 mg via INTRAVENOUS
  Filled 2023-11-27: qty 1

## 2023-11-27 MED ORDER — ACETAMINOPHEN 325 MG PO TABS
650.0000 mg | ORAL_TABLET | Freq: Four times a day (QID) | ORAL | Status: DC | PRN
  Administered 2023-11-28 – 2023-11-29 (×2): 650 mg via ORAL
  Filled 2023-11-27 (×2): qty 2

## 2023-11-27 NOTE — Plan of Care (Signed)

## 2023-11-27 NOTE — Progress Notes (Signed)
Subjective:  CC: William [name] is a 58 y.o. male  Hospital stay day 1,   SBO  HPI: NG came out last night. Mult BM last night as well.  ROS:  General: Denies weight loss, weight gain, fatigue, fevers, chills, and night sweats. Heart: Denies chest pain, palpitations, racing heart, irregular heartbeat, leg pain or swelling, and decreased activity tolerance. Respiratory: Denies breathing difficulty, shortness of breath, wheezing, cough, and sputum. GI: Denies change in appetite, heartburn, nausea, vomiting, constipation, diarrhea, and blood in stool. GU: Denies difficulty urinating, pain with urinating, urgency, frequency, blood in urine.   Objective:   Temp:  [97.5 F (36.4 C)-99.3 F (37.4 C)] 97.5 F (36.4 C) (02/11 0809) Pulse Rate:  [69-84] 69 (02/11 0809) Resp:  [16-18] 18 (02/11 0809) BP: (121-139)/(83-88) 130/83 (02/11 0809) SpO2:  [97 %-100 %] 97 % (02/11 0809)     Height: 6\' 1"  (185.4 cm) Weight: 88.5 kg BMI (Calculated): 25.73   Intake/Output this shift:   Intake/Output Summary (Last 24 hours) at 11/27/2023 0912 Last data filed at 11/27/2023 0547 Gross per 24 hour  Intake 1548.63 ml  Output 950 ml  Net 598.63 ml    Constitutional :  alert, cooperative, appears stated age, and no distress  Respiratory:  clear to auscultation bilaterally  Cardiovascular:  regular rate and rhythm  Gastrointestinal: Soft, no guarding, some soreness right of umbilicus, some in LLQ as well .   Skin: Cool and moist.   Psychiatric: Normal affect, non-agitated, not confused       LABS:     Latest Ref Rng & Units 11/26/2023    2:03 PM 11/26/2023    4:57 AM 07/12/2022    4:16 AM  CMP  Glucose 70 - 99 mg/dL  960  93   BUN 6 - 20 mg/dL  17  21   Creatinine 4.54 - 1.24 mg/dL 0.98  1.19  1.47   Sodium 135 - 145 mmol/L  141  145   Potassium 3.5 - 5.1 mmol/L  3.7  3.5   Chloride 98 - 111 mmol/L  103  102   CO2 22 - 32 mmol/L  26  30   Calcium 8.9 - 10.3 mg/dL  9.8  9.6   Total Protein  6.5 - 8.1 g/dL  7.9    Total Bilirubin 0.0 - 1.2 mg/dL  0.7    Alkaline Phos 38 - 126 U/L  66    AST 15 - 41 U/L  43    ALT 0 - 44 U/L  41        Latest Ref Rng & Units 11/26/2023    2:03 PM 11/26/2023    4:57 AM 07/12/2022    4:16 AM  CBC  WBC 4.0 - 10.5 K/uL 10.6  10.8  8.9   Hemoglobin 13.0 - 17.0 g/dL 82.9  56.2  13.0   Hematocrit 39.0 - 52.0 % 44.8  46.1  42.4   Platelets 150 - 400 K/uL 282  280  247     RADS: Narrative & Impression  CLINICAL DATA:  Small bowel obstruction 8 hour delayed   EXAM: PORTABLE ABDOMEN - 1 VIEW   COMPARISON:  X-ray abdomen 11/25/2018 at 10:23 a.m., CT abdomen pelvis 11/26/2023   FINDINGS: PO contrast reaches the rectum. Multiple loops of small bowel and colon dilated with gas. No radio-opaque calculi or other significant radiographic abnormality are seen.   IMPRESSION: 1. PO contrast reaches the rectum. 2. Findings suggestive of ileus versus partial small bowel obstruction.  Electronically Signed   By: Tish Frederickson M.D.   On: 11/26/2023 23:48   Assessment:   SBO, xray as above and mult BM recorded. Resolving.  Clears for today due to pt concerns of still residual pain.  Will consider advancing tomorrow if tolerates well today.    labs/images/medications/previous chart entries reviewed personally and relevant changes/updates noted above.

## 2023-11-28 LAB — BASIC METABOLIC PANEL
Anion gap: 13 (ref 5–15)
BUN: 31 mg/dL — ABNORMAL HIGH (ref 6–20)
CO2: 30 mmol/L (ref 22–32)
Calcium: 9.4 mg/dL (ref 8.9–10.3)
Chloride: 102 mmol/L (ref 98–111)
Creatinine, Ser: 0.96 mg/dL (ref 0.61–1.24)
GFR, Estimated: >60 mL/min (ref >60)
Glucose, Bld: 105 mg/dL — ABNORMAL HIGH (ref 70–99)
Potassium: 3.8 mmol/L (ref 3.5–5.1)
Sodium: 145 mmol/L (ref 135–145)

## 2023-11-28 LAB — GASTROINTESTINAL PANEL BY PCR, STOOL (REPLACES STOOL CULTURE)

## 2023-11-28 MED ORDER — SODIUM CHLORIDE 0.9 % IV SOLN: INTRAVENOUS | Status: AC

## 2023-11-28 MED ORDER — BOOST / RESOURCE BREEZE PO LIQD CUSTOM
1.0000 | Freq: Three times a day (TID) | ORAL | Status: DC
  Administered 2023-11-28: 1 via ORAL

## 2023-11-28 NOTE — Plan of Care (Signed)

## 2023-11-28 NOTE — Progress Notes (Signed)
Subjective:  CC: William [name] is a 58 y.o. male  Hospital stay day 2,   SBO  HPI: Episode of emesis.  This morning feeling much better  ROS:  General: Denies weight loss, weight gain, fatigue, fevers, chills, and night sweats. Heart: Denies chest pain, palpitations, racing heart, irregular heartbeat, leg pain or swelling, and decreased activity tolerance. Respiratory: Denies breathing difficulty, shortness of breath, wheezing, cough, and sputum. GI: Denies change in appetite, heartburn, nausea, vomiting, constipation, diarrhea, and blood in stool. GU: Denies difficulty urinating, pain with urinating, urgency, frequency, blood in urine.   Objective:   Temp:  [97.8 F (36.6 C)-98.2 F (36.8 C)] 98.1 F (36.7 C) (02/12 0809) Pulse Rate:  [71-81] 71 (02/12 0809) Resp:  [18-19] 18 (02/12 0809) BP: (122-140)/(76-96) 123/76 (02/12 0809) SpO2:  [96 %-98 %] 98 % (02/12 0809)     Height: 6\' 1"  (185.4 cm) Weight: 88.5 kg BMI (Calculated): 25.73   Intake/Output this shift:   Intake/Output Summary (Last 24 hours) at 11/28/2023 1540 Last data filed at 11/28/2023 1511 Gross per 24 hour  Intake 704.82 ml  Output --  Net 704.82 ml    Constitutional :  alert, cooperative, appears stated age, and no distress  Respiratory:  clear to auscultation bilaterally  Cardiovascular:  regular rate and rhythm  Gastrointestinal: Soft, no guarding, some soreness right of umbilicus, some in LLQ as well .   Skin: Cool and moist.   Psychiatric: Normal affect, non-agitated, not confused       LABS:     Latest Ref Rng & Units 11/28/2023    5:37 AM 11/27/2023    7:01 PM 11/26/2023    2:03 PM  CMP  Glucose 70 - 99 mg/dL 161  096    BUN 6 - 20 mg/dL 31  28    Creatinine 0.45 - 1.24 mg/dL 4.09  8.11  9.14   Sodium 135 - 145 mmol/L 145  144    Potassium 3.5 - 5.1 mmol/L 3.8  3.0    Chloride 98 - 111 mmol/L 102  97    CO2 22 - 32 mmol/L 30  29    Calcium 8.9 - 10.3 mg/dL 9.4  78.2        Latest Ref Rng  & Units 11/26/2023    2:03 PM 11/26/2023    4:57 AM 07/12/2022    4:16 AM  CBC  WBC 4.0 - 10.5 K/uL 10.6  10.8  8.9   Hemoglobin 13.0 - 17.0 g/dL 95.6  21.3  08.6   Hematocrit 39.0 - 52.0 % 44.8  46.1  42.4   Platelets 150 - 400 K/uL 282  280  247     RADS: Narrative & Impression  CLINICAL DATA:  Small bowel obstruction 8 hour delayed   EXAM: PORTABLE ABDOMEN - 1 VIEW   COMPARISON:  X-ray abdomen 11/25/2018 at 10:23 a.m., CT abdomen pelvis 11/26/2023   FINDINGS: PO contrast reaches the rectum. Multiple loops of small bowel and colon dilated with gas. No radio-opaque calculi or other significant radiographic abnormality are seen.   IMPRESSION: 1. PO contrast reaches the rectum. 2. Findings suggestive of ileus versus partial small bowel obstruction.     Electronically Signed   By: Tish Frederickson M.D.   On: 11/26/2023 23:48   Assessment:   SBO.  Episode of emesis overnight but continues to have bowel movements throughout the day.  Will order stool study just to make sure this is not a infectious etiology.  Started off  the with n.p.o. and reexamination later this afternoon patient continues to do well.  Feels like we can resume clear liquid diet.  Still passing flatus.  Will resume clears slowly and hope that his vomiting episode last night was from lack of Protonix use the past couple days.  Continue IV fluids in the meantime as well   labs/images/medications/previous chart entries reviewed personally and relevant changes/updates noted above.

## 2023-11-28 NOTE — Plan of Care (Signed)
Problem: Education: Goal: Knowledge of General Education information will improve Description: Including pain rating scale, medication(s)/side effects and non-pharmacologic comfort measures Outcome: Progressing   Problem: Health Behavior/Discharge Planning: Goal: Ability to manage health-related needs will improve Outcome: Progressing   Problem: Activity: Goal: Risk for activity intolerance will decrease Outcome: Progressing

## 2023-11-28 NOTE — TOC CM/SW Note (Signed)
Transition of Care [facility]) - Inpatient Brief Assessment   Patient Details  Name: William [name] MRN: [id number] Date of Birth: [birth year]  Transition of Care [facility]) CM/SW Contact:    Allena Katz, LCSW Phone Number: 11/28/2023, 3:24 PM   Clinical Narrative:    Transition of Care Asessment: Insurance and Status: Insurance coverage has been reviewed Patient has primary care physician: Yes Home environment has been reviewed: [id number] [place] [id number]- Prior level of function:: Independently Prior/Current Home Services: No current home services Social Drivers of Health Review: SDOH reviewed no interventions necessary Readmission risk has been reviewed: Yes Transition of care needs: no transition of care needs at this time

## 2023-11-28 NOTE — Plan of Care (Signed)

## 2023-11-29 LAB — BASIC METABOLIC PANEL
Anion gap: 10 (ref 5–15)
BUN: 27 mg/dL — ABNORMAL HIGH (ref 6–20)
CO2: 27 mmol/L (ref 22–32)
Calcium: 9.2 mg/dL (ref 8.9–10.3)
Chloride: 105 mmol/L (ref 98–111)
Creatinine, Ser: 0.82 mg/dL (ref 0.61–1.24)
GFR, Estimated: >60 mL/min (ref >60)
Glucose, Bld: 97 mg/dL (ref 70–99)
Potassium: 3.5 mmol/L (ref 3.5–5.1)
Sodium: 142 mmol/L (ref 135–145)

## 2023-11-29 MED ORDER — ZOLPIDEM TARTRATE 5 MG PO TABS
10.0000 mg | ORAL_TABLET | Freq: Every day | ORAL | Status: DC
  Administered 2023-11-30 – 2023-12-02 (×3): 10 mg via ORAL
  Filled 2023-11-29 (×3): qty 2

## 2023-11-29 MED ORDER — LOSARTAN POTASSIUM 50 MG PO TABS
50.0000 mg | ORAL_TABLET | Freq: Every day | ORAL | Status: DC
  Administered 2023-11-30 – 2023-12-02 (×3): 50 mg via ORAL
  Filled 2023-11-29 (×3): qty 1

## 2023-11-29 MED ORDER — AMLODIPINE BESYLATE 10 MG PO TABS
10.0000 mg | ORAL_TABLET | Freq: Every day | ORAL | Status: DC
  Administered 2023-11-30 – 2023-12-02 (×3): 10 mg via ORAL
  Filled 2023-11-29 (×3): qty 1

## 2023-11-29 MED ORDER — SIMETHICONE 80 MG PO CHEW
80.0000 mg | CHEWABLE_TABLET | Freq: Four times a day (QID) | ORAL | Status: DC
  Administered 2023-11-29 – 2023-12-03 (×16): 80 mg via ORAL
  Filled 2023-11-29 (×19): qty 1

## 2023-11-29 NOTE — Plan of Care (Signed)

## 2023-11-29 NOTE — Progress Notes (Signed)
Subjective:  CC: William [name] is a 58 y.o. male  Hospital stay day 3,   SBO  HPI: Feeling better again this morning.  No episodes of emesis but still has some nausea and pain in the abdomen.  Multiple bowel movements reported  ROS:  General: Denies weight loss, weight gain, fatigue, fevers, chills, and night sweats. Heart: Denies chest pain, palpitations, racing heart, irregular heartbeat, leg pain or swelling, and decreased activity tolerance. Respiratory: Denies breathing difficulty, shortness of breath, wheezing, cough, and sputum. GI: Denies change in appetite, heartburn, nausea, vomiting, constipation, diarrhea, and blood in stool. GU: Denies difficulty urinating, pain with urinating, urgency, frequency, blood in urine.   Objective:   Temp:  [97.7 F (36.5 C)-98 F (36.7 C)] 97.9 F (36.6 C) (02/13 0808) Pulse Rate:  [70-75] 70 (02/13 0808) Resp:  [17-18] 18 (02/13 0808) BP: (118-127)/(83-90) 118/85 (02/13 0808) SpO2:  [96 %-100 %] 100 % (02/13 0808)     Height: 6\' 1"  (185.4 cm) Weight: 88.5 kg BMI (Calculated): 25.73   Intake/Output this shift:   Intake/Output Summary (Last 24 hours) at 11/29/2023 1042 Last data filed at 11/28/2023 1900 Gross per 24 hour  Intake 426.29 ml  Output --  Net 426.29 ml    Constitutional :  alert, cooperative, appears stated age, and no distress  Respiratory:  clear to auscultation bilaterally  Cardiovascular:  regular rate and rhythm  Gastrointestinal: Soft, no guarding, some soreness right of umbilicus, some in LLQ as well .  Overall tenderness to palpation of unchanged  Skin: Cool and moist.   Psychiatric: Normal affect, non-agitated, not confused       LABS:     Latest Ref Rng & Units 11/29/2023    4:29 AM 11/28/2023    5:37 AM 11/27/2023    7:01 PM  CMP  Glucose 70 - 99 mg/dL 97  161  096   BUN 6 - 20 mg/dL 27  31  28    Creatinine 0.61 - 1.24 mg/dL 0.45  4.09  8.11   Sodium 135 - 145 mmol/L 142  145  144   Potassium 3.5 - 5.1  mmol/L 3.5  3.8  3.0   Chloride 98 - 111 mmol/L 105  102  97   CO2 22 - 32 mmol/L 27  30  29    Calcium 8.9 - 10.3 mg/dL 9.2  9.4  91.4       Latest Ref Rng & Units 11/26/2023    2:03 PM 11/26/2023    4:57 AM 07/12/2022    4:16 AM  CBC  WBC 4.0 - 10.5 K/uL 10.6  10.8  8.9   Hemoglobin 13.0 - 17.0 g/dL 78.2  95.6  21.3   Hematocrit 39.0 - 52.0 % 44.8  46.1  42.4   Platelets 150 - 400 K/uL 282  280  247     RADS: Narrative & Impression  CLINICAL DATA:  Small bowel obstruction 8 hour delayed   EXAM: PORTABLE ABDOMEN - 1 VIEW   COMPARISON:  X-ray abdomen 11/25/2018 at 10:23 a.m., CT abdomen pelvis 11/26/2023   FINDINGS: PO contrast reaches the rectum. Multiple loops of small bowel and colon dilated with gas. No radio-opaque calculi or other significant radiographic abnormality are seen.   IMPRESSION: 1. PO contrast reaches the rectum. 2. Findings suggestive of ileus versus partial small bowel obstruction.     Electronically Signed   By: Tish Frederickson M.D.   On: 11/26/2023 23:48   Assessment:   SBO.  Multiple bowel  movements again and no episode of emesis.  Still having some tenderness to palpation.  Will add simethicone to see if pain can be improved and will resume clear liquid diet and continue to monitor for today.  Patient states he is feeling well and still hoping to avoid surgery if possible.  Due to the recurrent emesis despite multiple bowel movements recorded, GI panel was ordered which was negative.  labs/images/medications/previous chart entries reviewed personally and relevant changes/updates noted above.

## 2023-11-30 LAB — BASIC METABOLIC PANEL
Anion gap: 10 (ref 5–15)
BUN: 24 mg/dL — ABNORMAL HIGH (ref 6–20)
CO2: 25 mmol/L (ref 22–32)
Calcium: 9.3 mg/dL (ref 8.9–10.3)
Chloride: 106 mmol/L (ref 98–111)
Creatinine, Ser: 0.91 mg/dL (ref 0.61–1.24)
GFR, Estimated: >60 mL/min (ref >60)
Glucose, Bld: 99 mg/dL (ref 70–99)
Potassium: 3.5 mmol/L (ref 3.5–5.1)
Sodium: 141 mmol/L (ref 135–145)

## 2023-11-30 NOTE — Plan of Care (Signed)

## 2023-11-30 NOTE — Progress Notes (Signed)
Subjective:  CC: William [name] is a 58 y.o. male  Hospital stay day 4,   SBO  HPI: Feeling better again this morning.  No episodes of emesis but still has some nausea and pain in the abdomen.  Multiple bowel movements reported again.  Overall feels like he is improving.  ROS:  General: Denies weight loss, weight gain, fatigue, fevers, chills, and night sweats. Heart: Denies chest pain, palpitations, racing heart, irregular heartbeat, leg pain or swelling, and decreased activity tolerance. Respiratory: Denies breathing difficulty, shortness of breath, wheezing, cough, and sputum. GI: Denies change in appetite, heartburn, nausea, vomiting, constipation, diarrhea, and blood in stool. GU: Denies difficulty urinating, pain with urinating, urgency, frequency, blood in urine.   Objective:   Temp:  [97.6 F (36.4 C)-98.3 F (36.8 C)] 98.3 F (36.8 C) (02/14 0725) Pulse Rate:  [68-80] 80 (02/14 0725) Resp:  [16-17] 16 (02/14 0725) BP: (117-123)/(80-86) 117/83 (02/14 0725) SpO2:  [95 %-99 %] 97 % (02/14 0725)     Height: 6\' 1"  (185.4 cm) Weight: 88.5 kg BMI (Calculated): 25.73   Intake/Output this shift:   Intake/Output Summary (Last 24 hours) at 11/30/2023 1308 Last data filed at 11/30/2023 1056 Gross per 24 hour  Intake 360 ml  Output --  Net 360 ml    Constitutional :  alert, cooperative, appears stated age, and no distress  Respiratory:  clear to auscultation bilaterally  Cardiovascular:  regular rate and rhythm  Gastrointestinal: Soft, no guarding, some soreness right of umbilicus, some in LLQ as well .  Overall tenderness to palpation of unchanged  Skin: Cool and moist.   Psychiatric: Normal affect, non-agitated, not confused       LABS:     Latest Ref Rng & Units 11/30/2023    5:17 AM 11/29/2023    4:29 AM 11/28/2023    5:37 AM  CMP  Glucose 70 - 99 mg/dL 99  97  829   BUN 6 - 20 mg/dL 24  27  31    Creatinine 0.61 - 1.24 mg/dL 5.62  1.30  8.65   Sodium 135 - 145 mmol/L  141  142  145   Potassium 3.5 - 5.1 mmol/L 3.5  3.5  3.8   Chloride 98 - 111 mmol/L 106  105  102   CO2 22 - 32 mmol/L 25  27  30    Calcium 8.9 - 10.3 mg/dL 9.3  9.2  9.4       Latest Ref Rng & Units 11/26/2023    2:03 PM 11/26/2023    4:57 AM 07/12/2022    4:16 AM  CBC  WBC 4.0 - 10.5 K/uL 10.6  10.8  8.9   Hemoglobin 13.0 - 17.0 g/dL 78.4  69.6  29.5   Hematocrit 39.0 - 52.0 % 44.8  46.1  42.4   Platelets 150 - 400 K/uL 282  280  247     RADS: Narrative & Impression  CLINICAL DATA:  Small bowel obstruction 8 hour delayed   EXAM: PORTABLE ABDOMEN - 1 VIEW   COMPARISON:  X-ray abdomen 11/25/2018 at 10:23 a.m., CT abdomen pelvis 11/26/2023   FINDINGS: PO contrast reaches the rectum. Multiple loops of small bowel and colon dilated with gas. No radio-opaque calculi or other significant radiographic abnormality are seen.   IMPRESSION: 1. PO contrast reaches the rectum. 2. Findings suggestive of ileus versus partial small bowel obstruction.     Electronically Signed   By: Tish Frederickson M.D.   On: 11/26/2023 23:48  Assessment:   SBO.  Stable.  Patient reports feeling overall better since hospital admission.  Will proceed with full liquid diet and continue to monitor.  labs/images/medications/previous chart entries reviewed personally and relevant changes/updates noted above.

## 2023-11-30 NOTE — Plan of Care (Signed)

## 2023-12-01 LAB — BASIC METABOLIC PANEL
Anion gap: 8 (ref 5–15)
BUN: 22 mg/dL — ABNORMAL HIGH (ref 6–20)
CO2: 24 mmol/L (ref 22–32)
Calcium: 9.1 mg/dL (ref 8.9–10.3)
Chloride: 106 mmol/L (ref 98–111)
Creatinine, Ser: 0.92 mg/dL (ref 0.61–1.24)
GFR, Estimated: >60 mL/min (ref >60)
Glucose, Bld: 96 mg/dL (ref 70–99)
Potassium: 3.5 mmol/L (ref 3.5–5.1)
Sodium: 138 mmol/L (ref 135–145)

## 2023-12-01 MED ORDER — NAPHAZOLINE-GLYCERIN 0.012-0.25 % OP SOLN
1.0000 [drp] | Freq: Four times a day (QID) | OPHTHALMIC | Status: DC | PRN
  Administered 2023-12-01 – 2023-12-02 (×2): 1 [drp] via OPHTHALMIC
  Filled 2023-12-01: qty 15

## 2023-12-01 NOTE — Plan of Care (Signed)
°  Problem: Education: °Goal: Knowledge of General Education information will improve °Description: Including pain rating scale, medication(s)/side effects and non-pharmacologic comfort measures °Outcome: Progressing °  °Problem: Health Behavior/Discharge Planning: °Goal: Ability to manage health-related needs will improve °Outcome: Progressing °  °Problem: Clinical Measurements: °Goal: Ability to maintain clinical measurements within normal limits will improve °Outcome: Progressing °Goal: Will remain free from infection °Outcome: Progressing °Goal: Diagnostic test results will improve °Outcome: Progressing °Goal: Respiratory complications will improve °Outcome: Progressing °Goal: Cardiovascular complication will be avoided °Outcome: Progressing °  °Problem: Coping: °Goal: Level of anxiety will decrease °Outcome: Progressing °  °Problem: Nutrition: °Goal: Adequate nutrition will be maintained °Outcome: Progressing °  °

## 2023-12-01 NOTE — Progress Notes (Signed)
 Patient ID: William [name], male   DOB: 04-Aug-1966, 58 y.o.   MRN: [id number]     SURGICAL PROGRESS NOTE   Hospital Day(s): 5.   Interval History: Patient seen and examined, no acute events or new complaints overnight. Patient reports some nonspecific soreness in the abdomen.  He endorses that the nausea is improving no nausea or vomiting since yesterday.  He endorses continued passing gas and had a bowel movement this morning.  Vital signs in last 24 hours: [min-max] current  Temp:  [97.5 F (36.4 C)-98.1 F (36.7 C)] 97.9 F (36.6 C) (02/15 0834) Pulse Rate:  [61-87] 61 (02/15 0834) Resp:  [16-18] 18 (02/15 0834) BP: (107-124)/(74-83) 107/74 (02/15 0834) SpO2:  [97 %-100 %] 97 % (02/15 0834)     Height: 6\' 1"  (185.4 cm) Weight: 88.5 kg BMI (Calculated): 25.73   Physical Exam:  Constitutional: alert, cooperative and no distress  Respiratory: breathing non-labored at rest  Cardiovascular: regular rate and sinus rhythm  Gastrointestinal: soft, non-tender, and non-distended  Labs:     Latest Ref Rng & Units 11/26/2023    2:03 PM 11/26/2023    4:57 AM 07/12/2022    4:16 AM  CBC  WBC 4.0 - 10.5 K/uL 10.6  10.8  8.9   Hemoglobin 13.0 - 17.0 g/dL 53.6  64.4  03.4   Hematocrit 39.0 - 52.0 % 44.8  46.1  42.4   Platelets 150 - 400 K/uL 282  280  247       Latest Ref Rng & Units 12/01/2023    6:08 AM 11/30/2023    5:17 AM 11/29/2023    4:29 AM  CMP  Glucose 70 - 99 mg/dL 96  99  97   BUN 6 - 20 mg/dL 22  24  27    Creatinine 0.61 - 1.24 mg/dL 7.42  5.95  6.38   Sodium 135 - 145 mmol/L 138  141  142   Potassium 3.5 - 5.1 mmol/L 3.5  3.5  3.5   Chloride 98 - 111 mmol/L 106  106  105   CO2 22 - 32 mmol/L 24  25  27    Calcium 8.9 - 10.3 mg/dL 9.1  9.3  9.2     Imaging studies: No new pertinent imaging studies   Assessment/Plan:  58 y.o. male with small bowel obstruction, complicated by pertinent comorbidities including hypertension, GERD.  -Stable vital signs -Abdominal pain slowly  improving.  There is some soreness but no significant pain -He continue passing gas and having bowel movement -He feels comfortable trying to advance diet to soft diet understanding that he will eat small amounts, more frequently -Encouraged the patient to ambulate -Continue pain management  Gae Gallop, MD

## 2023-12-01 NOTE — Plan of Care (Signed)

## 2023-12-02 LAB — BASIC METABOLIC PANEL
Anion gap: 11 (ref 5–15)
BUN: 20 mg/dL (ref 6–20)
CO2: 26 mmol/L (ref 22–32)
Calcium: 9.4 mg/dL (ref 8.9–10.3)
Chloride: 104 mmol/L (ref 98–111)
Creatinine, Ser: 0.87 mg/dL (ref 0.61–1.24)
GFR, Estimated: >60 mL/min (ref >60)
Glucose, Bld: 93 mg/dL (ref 70–99)
Potassium: 4 mmol/L (ref 3.5–5.1)
Sodium: 141 mmol/L (ref 135–145)

## 2023-12-02 NOTE — Plan of Care (Signed)
°  Problem: Education: °Goal: Knowledge of General Education information will improve °Description: Including pain rating scale, medication(s)/side effects and non-pharmacologic comfort measures °Outcome: Progressing °  °Problem: Health Behavior/Discharge Planning: °Goal: Ability to manage health-related needs will improve °Outcome: Progressing °  °Problem: Clinical Measurements: °Goal: Ability to maintain clinical measurements within normal limits will improve °Outcome: Progressing °Goal: Will remain free from infection °Outcome: Progressing °Goal: Diagnostic test results will improve °Outcome: Progressing °Goal: Respiratory complications will improve °Outcome: Progressing °Goal: Cardiovascular complication will be avoided °Outcome: Progressing °  °Problem: Coping: °Goal: Level of anxiety will decrease °Outcome: Progressing °  °Problem: Nutrition: °Goal: Adequate nutrition will be maintained °Outcome: Progressing °  °

## 2023-12-02 NOTE — Plan of Care (Signed)

## 2023-12-02 NOTE — Progress Notes (Signed)
 Patient ID: William [name], male   DOB: 12-09-1965, 58 y.o.   MRN: [id number]     SURGICAL PROGRESS NOTE   Hospital Day(s): 6.   Interval History: Patient seen and examined, no acute events or new complaints overnight. Patient reports he continue having intermittent pain pain localized sometimes on 1 side of the abdomen sometimes at other side of the abdomen.  He had had of the sandwich yesterday but not great appetite.  He is still passing gas.  No significant bowel movement.  Patient asking for an order CT scan of the abdomen.  Vital signs in last 24 hours: [min-max] current  Temp:  [97.4 F (36.3 C)-98 F (36.7 C)] 97.8 F (36.6 C) (02/16 0552) Pulse Rate:  [67-75] 67 (02/16 0552) Resp:  [16-20] 18 (02/16 0552) BP: (107-111)/(70-83) 107/70 (02/16 0552) SpO2:  [96 %-100 %] 96 % (02/16 0552)     Height: 6\' 1"  (185.4 cm) Weight: 88.5 kg BMI (Calculated): 25.73   Physical Exam:  Constitutional: alert, cooperative and no distress  Respiratory: breathing non-labored at rest  Cardiovascular: regular rate and sinus rhythm  Gastrointestinal: soft, mild-tender, and non-distended  Labs:     Latest Ref Rng & Units 11/26/2023    2:03 PM 11/26/2023    4:57 AM 07/12/2022    4:16 AM  CBC  WBC 4.0 - 10.5 K/uL 10.6  10.8  8.9   Hemoglobin 13.0 - 17.0 g/dL 96.2  95.2  84.1   Hematocrit 39.0 - 52.0 % 44.8  46.1  42.4   Platelets 150 - 400 K/uL 282  280  247       Latest Ref Rng & Units 12/02/2023    5:13 AM 12/01/2023    6:08 AM 11/30/2023    5:17 AM  CMP  Glucose 70 - 99 mg/dL 93  96  99   BUN 6 - 20 mg/dL 20  22  24    Creatinine 0.61 - 1.24 mg/dL 3.24  4.01  0.27   Sodium 135 - 145 mmol/L 141  138  141   Potassium 3.5 - 5.1 mmol/L 4.0  3.5  3.5   Chloride 98 - 111 mmol/L 104  106  106   CO2 22 - 32 mmol/L 26  24  25    Calcium 8.9 - 10.3 mg/dL 9.4  9.1  9.3     Imaging studies: No new pertinent imaging studies   Assessment/Plan:  58 y.o. male with small bowel obstruction, complicated by  pertinent comorbidities including hypertension, GERD.   -Stable vital signs -Continue with intermittent abdominal pain, nonlocalized, in different areas of the abdomen during the day.  Difficult to clinic pain with bowel obstruction since the x-rays shows resolved bowel obstruction.  Patient passing gas.  May be functional pain -He continue passing gas and having bowel movement -Keep soft diet today.  Eating small amount.  N.p.o. after midnight for CT scan. -Encouraged the patient to ambulate -Continue pain management  Gae Gallop, MD

## 2023-12-03 ENCOUNTER — Inpatient Hospital Stay: Payer: No Typology Code available for payment source

## 2023-12-03 LAB — CREATININE, SERUM
Creatinine, Ser: 0.8 mg/dL (ref 0.61–1.24)
GFR, Estimated: >60 mL/min (ref >60)

## 2023-12-03 MED ORDER — DOCUSATE SODIUM 100 MG PO CAPS: 100.0000 mg | ORAL_CAPSULE | Freq: Two times a day (BID) | ORAL | 0 refills | Status: AC | PRN

## 2023-12-03 MED ORDER — IOHEXOL 9 MG/ML PO SOLN
500.0000 mL | ORAL | Status: AC
  Administered 2023-12-03 (×2): 500 mL via ORAL

## 2023-12-03 MED ORDER — IOHEXOL 300 MG/ML  SOLN
100.0000 mL | Freq: Once | INTRAMUSCULAR | Status: AC | PRN
  Administered 2023-12-03: 100 mL via INTRAVENOUS

## 2023-12-03 MED ORDER — IBUPROFEN 400 MG PO TABS: 400.0000 mg | ORAL_TABLET | Freq: Three times a day (TID) | ORAL | 0 refills | Status: AC | PRN

## 2023-12-03 MED ORDER — OXYCODONE-ACETAMINOPHEN 5-325 MG PO TABS: 1.0000 | ORAL_TABLET | Freq: Three times a day (TID) | ORAL | 0 refills | Status: AC | PRN

## 2023-12-03 NOTE — Plan of Care (Signed)

## 2023-12-05 NOTE — Discharge Summary (Signed)
 Physician Discharge Summary  Patient ID: William [name] MRN: [id number] DOB/AGE: [birth year] 58 y.o.  Admit date: 11/26/2023 Discharge date: 12/03/2023  Admission Diagnoses: Recurrent small bowel obstruction  Discharge Diagnoses:  Same as above  Discharged Condition: good  Hospital Course: admitted for above.  Underwent NG tube decompression and small bowel follow-through study noted contrast reaching the colon after initial study.  NG tube removed and diet resumed but patient had a recurrent episode of emesis.  Diet slowly advance as the patient continued to have bowel movements throughout the episode of vomiting and persistent nausea.  GI panel ordered and that was negative due to the somewhat conflicting clinical picture.  Patient eventually was able to advance to a regular diet with continued bowel movements and decreasing nausea.  However, due to the persistent nausea, CT scan was reordered to confirm whether or not there is a persistent area of concern.  Second CT showed a slight improvement with decreased dilation but cannot definitively say the transition point was persistent in the area of concern noted on initial CT.  After extensive discussion with the patient regarding his symptoms, plan was to proceed with discharge and outpatient follow-up since the patient was tolerating a regular diet with minimal nausea and pain and continue to have bowel movements almost on a daily basis at the time the CT scan was done.  Consults: None  Discharge Exam: Blood pressure 107/78, pulse 73, temperature 98.1 F (36.7 C), temperature source Oral, resp. rate 16, height 6\' 1"  (1.854 m), weight 88.5 kg, SpO2 100%. General appearance: alert, cooperative, and no distress GI: Soft no guarding minimal tenderness to palpation right upper quadrant  Disposition:  Discharge disposition: 01-Home or Self Care       Discharge Instructions     Discharge patient   Complete by: As directed    Discharge  disposition: 01-Home or Self Care   Discharge patient date: 12/03/2023      Allergies as of 12/03/2023       Reactions   Augmentin [amoxicillin-pot Clavulanate] Diarrhea   Acyclovir And Related Swelling   Mouth swelling and sores   Biaxin [clarithromycin]    Headache and vomiting,sores in mouth   Flomax [tamsulosin Hcl]    Headache, dizziness   Other Nausea And Vomiting   anesthesia        Medication List     STOP taking these medications    Semaglutide-Weight Management 0.25 MG/0.5ML Soaj       TAKE these medications    ALIVE MENS ENERGY PO Take 1 tablet by mouth daily.   amLODipine 10 MG tablet Commonly known as: NORVASC Take 1 tablet (10 mg total) by mouth at bedtime.   docusate sodium 100 MG capsule Commonly known as: Colace Take 1 capsule (100 mg total) by mouth 2 (two) times daily as needed for up to 10 days for mild constipation.   fluticasone 50 MCG/ACT nasal spray Commonly known as: FLONASE Use 2 spray(s) in each nostril once daily   ibuprofen 400 MG tablet Commonly known as: ADVIL Take 1 tablet (400 mg total) by mouth every 8 (eight) hours as needed for mild pain (pain score 1-3) or moderate pain (pain score 4-6).   losartan 50 MG tablet Commonly known as: COZAAR Take 50 mg by mouth daily.   lovastatin 40 MG tablet Commonly known as: MEVACOR Take 1 tablet (40 mg total) by mouth at bedtime.   omeprazole 40 MG capsule Commonly known as: PRILOSEC Take 1 capsule (40 mg total)  by mouth daily.   oxyCODONE-acetaminophen 5-325 MG tablet Commonly known as: Percocet Take 1 tablet by mouth every 8 (eight) hours as needed for severe pain (pain score 7-10).   tamsulosin 0.4 MG Caps capsule Commonly known as: FLOMAX Take 1 capsule (0.4 mg total) by mouth daily after supper.   zolpidem 10 MG tablet Commonly known as: AMBIEN Take 10 mg by mouth at bedtime. What changed: Another medication with the same name was removed. Continue taking this  medication, and follow the directions you see here.        Follow-up Information     Jerl Mina, MD Follow up.   Specialty: Family Medicine Why: Hospital follow up Contact information: [address] [facility] [place] [address] [phone]                  Total time spent arranging discharge was >15min. Signed: Sung Amabile 12/05/2023, 9:16 AM

## 2023-12-06 ENCOUNTER — Other Ambulatory Visit: Payer: Self-pay | Admitting: Urology

## 2023-12-07 LAB — GENECONNECT MOLECULAR SCREEN: Genetic Analysis Overall Interpretation: NEGATIVE

## 2024-01-16 ENCOUNTER — Other Ambulatory Visit: Payer: Self-pay | Admitting: Urology

## 2024-01-28 ENCOUNTER — Other Ambulatory Visit: Payer: Self-pay | Admitting: Family Medicine

## 2024-01-28 DIAGNOSIS — E785 Hyperlipidemia, unspecified: Secondary | ICD-10-CM

## 2024-01-31 ENCOUNTER — Ambulatory Visit
Admission: RE | Admit: 2024-01-31 | Discharge: 2024-01-31 | Disposition: A | Discharge status: Home / Self Care | Payer: Self-pay | Source: Ambulatory Visit | Attending: Family Medicine | Admitting: Family Medicine

## 2024-01-31 DIAGNOSIS — E785 Hyperlipidemia, unspecified: Secondary | ICD-10-CM | POA: Insufficient documentation

## 2024-06-15 ENCOUNTER — Other Ambulatory Visit: Payer: Self-pay | Admitting: Urology

## 2024-08-23 ENCOUNTER — Other Ambulatory Visit: Payer: Self-pay | Admitting: Urology

## 2024-09-18 ENCOUNTER — Other Ambulatory Visit: Payer: Self-pay

## 2024-09-18 DIAGNOSIS — R1032 Left lower quadrant pain: Secondary | ICD-10-CM

## 2024-09-24 ENCOUNTER — Ambulatory Visit
Admission: RE | Admit: 2024-09-24 | Discharge: 2024-09-24 | Disposition: A | Discharge status: Home / Self Care | Source: Ambulatory Visit

## 2024-09-24 DIAGNOSIS — R1032 Left lower quadrant pain: Secondary | ICD-10-CM | POA: Insufficient documentation

## 2024-09-24 MED ORDER — IOHEXOL 300 MG/ML  SOLN
100.0000 mL | Freq: Once | INTRAMUSCULAR | Status: AC | PRN
  Administered 2024-09-24: 100 mL via INTRAVENOUS

## 2024-10-21 ENCOUNTER — Telehealth: Payer: Self-pay

## 2024-10-21 NOTE — Telephone Encounter (Signed)
 Called patient to let him know that we received a refill request and to give us  a call back due to no DPR patient has not been seen in over a year, appt scheduled in mean time

## 2025-04-27 ENCOUNTER — Ambulatory Visit: Admitting: Urology
